# Patient Record
Sex: Male | Born: 1980
Health system: Southern US, Community
[De-identification: ages and names within clinical notes are randomized; demographics above are authoritative.]

## PROBLEM LIST (undated history)

## (undated) ENCOUNTER — Ambulatory Visit: Admission: EM | Payer: Medicaid Other

## (undated) DIAGNOSIS — S069XAA Unspecified intracranial injury with loss of consciousness status unknown, initial encounter: Secondary | ICD-10-CM

## (undated) DIAGNOSIS — G8929 Other chronic pain: Secondary | ICD-10-CM

## (undated) DIAGNOSIS — Z79891 Long term (current) use of opiate analgesic: Secondary | ICD-10-CM

## (undated) DIAGNOSIS — W3400XA Accidental discharge from unspecified firearms or gun, initial encounter: Secondary | ICD-10-CM

## (undated) DIAGNOSIS — S069X9A Unspecified intracranial injury with loss of consciousness of unspecified duration, initial encounter: Secondary | ICD-10-CM

## (undated) DIAGNOSIS — S0990XA Unspecified injury of head, initial encounter: Secondary | ICD-10-CM

## (undated) DIAGNOSIS — R0683 Snoring: Secondary | ICD-10-CM

## (undated) DIAGNOSIS — G4730 Sleep apnea, unspecified: Secondary | ICD-10-CM

## (undated) DIAGNOSIS — I10 Essential (primary) hypertension: Secondary | ICD-10-CM

## (undated) DIAGNOSIS — E785 Hyperlipidemia, unspecified: Secondary | ICD-10-CM

## (undated) DIAGNOSIS — K769 Liver disease, unspecified: Secondary | ICD-10-CM

## (undated) DIAGNOSIS — J45909 Unspecified asthma, uncomplicated: Secondary | ICD-10-CM

## (undated) DIAGNOSIS — E119 Type 2 diabetes mellitus without complications: Secondary | ICD-10-CM

## (undated) HISTORY — DX: Snoring: R06.83

## (undated) HISTORY — DX: Essential (primary) hypertension: I10

## (undated) HISTORY — DX: Sleep apnea, unspecified: G47.30

## (undated) HISTORY — DX: Hyperlipidemia, unspecified: E78.5

## (undated) HISTORY — PX: TONSILLECTOMY: SHX28A

## (undated) HISTORY — DX: Unspecified asthma, uncomplicated: J45.909

## (undated) HISTORY — DX: Liver disease, unspecified: K76.9

## (undated) HISTORY — DX: Morbid (severe) obesity due to excess calories: E66.01

## (undated) HISTORY — DX: Type 2 diabetes mellitus without complications: E11.9

## (undated) NOTE — Progress Notes (Signed)
Formatting of this note might be different from the original.  Pl make sure he is taking his amlodipine as well as losartan in addition to Aldactazide daily.  Electronically signed by Dorris Singh, MD at 09/02/2021 10:20 AM EDT

## (undated) NOTE — Progress Notes (Signed)
Formatting of this note is different from the original.  CC: Mr. Isaiah Henderson comes in for follow up of obstructive sleep apnea and asthma. He was last seen on 07/30/2016.    HPI: Mr. Isaiah Henderson was diagnosed to have   OSA and is using CPAP @ 12 cm H2O since  07/2012. He says that he is using CPAP every night all night and has noted with improvement.  Denies nocturnal awakenings, restless sleep or dyspnea. Feels fresh on waking up and has no daytime fatigue.     He continues to get episodic dyspnea with wheezing and takes  albuterol HFA inhaler 3-4 times daily with prompt relief. He is not taking any LABA/ICS medications. Marland KitchenHe is trying to lose weight  But has gained a further 12 lbs over the last one year.    ROS:   Asthma,   Hpertension,   Diabetes.   Obesity.    A complete 10 point ROS was negative other than that mentioned above .     PHYSICAL  EXAMINATION:    Vitals: BP 133/71   Pulse 79   Temp 36.6 C (97.8 F) (Oral)   Ht 1.778 m (5\' 10" )   Wt (!) 160.1 kg (352 lb 14.4 oz)   SpO2 97% on t: RA  BMI 50.64 kg/m  General: Awake ,alert and oriented and talking in complete sentences. Morbidly obese.  HEENT: Mallampati Gr 4 pharyngeal narrowing. No pallor or icterus No JVD or LAD in the neck   Neck is thick and short Circumference 19.5"  R/S: Clear without any wheezing or crackles   CVS:S1S2 RRR   ABD: Obese ,soft ,nontender with good bowel sounds   EXTR: No edema of feet       PFT:(07/02/2016):   PFT ():                      Pre   % pred    Post   % pred    % Change             FVC Liters      2.79     52        2.83       55               5             FEV1 Liters   1.85     42         2.02       47              9             FEV1/FVC %    68                               69  These indicate severe obstructive Defect . No significant FEV1 response to bronchodilator.  PFT:(02/05/2017):   PFT ():                      Pre   % pred    Post   % pred    % Change             FVC Liters      2.59     58      3.09         69  19             FEV1 Liters   1.65     45       2.08        57              27             FEV1/FVC %    63                              68  These indicate moderate  obstructive Defect . Significant FEV1 response to bronchodilator.    ASSESSMENT:   1.OSA on CPAP at 12 cmH2O  2. Asthma  3.Hypertension  4. Diabetes.   5.Morbid . Obesity.    PLAN:   Continue all current medications and follow up  Contiinue using CPAP @ 12 cmH2O every night all night  Prescribed:            Breo Ellipta 200/25 1 puff daily.            Albuterol (ventolin)) HFA 2 puff Q6H PRN  Advised to lose weight  RTC 6 months  Electronically signed by Jerl Mina, MD at 02/10/2017 11:18 AM EST

## (undated) NOTE — Progress Notes (Signed)
Formatting of this note is different from the original.  Subjective:        Patient ID: Isaiah Henderson is a 80 y.o. male.    HPI    Patient's medications, allergies, past medical, surgical, social and family histories were reviewed and updated as appropriate.    Isaiah Henderson is a 43 y.o. 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     My first encounter with Isaiah Henderson was on 01/21/2021, at which time the patient was referred for positive ANA, elevated CRP/ESR.     He saw his primary care doctor in 06/2020, reported bilateral arms and legs numbness and tingling. He has diabetes, hypertension, hyperlipidemia, asthma, OSA on CPAP, obesity.     Had screening test in 06/20/2020 ANA speckle 1:80., ESR 71. CRP 35.6.  Repeated labs 12/11/2020 CRP 24.5. ESR 21.    He has chest pain, SOB with asthma and OSA. Dry mouth but not dry eyes.     Denies skin rash, oral ulcer,  abdominal pain, diarrhea, constipation, dysuria, hematuria, headache. Denies photosensitivity, and Raynaud's.      03/04/2021, telemedicine visit. ANA speckle 1:160. CRP 24.5. ANA specificity, C3, C4 were all normal. He has fatigue, joints ache at back and hips. Recommended hydroxychloroquine 400mg  daily. Need yearly ophthalmologist exam to monitor retinal toxicity. Side effect explained.     09/02/2021, follow up visit. He felt good on hydroxychloroquine 400mg  daily. No joints pain anymore. Eye exam was normal. No side effects.     Review of Systems    Per HPI. Review of complete ROS is negative.     Past Medical History:   Diagnosis Date   ? Asthma    ? Bulging lumbar disc 02/2017   ? Diabetes mellitus     Diet and exercise controlled   ? 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 Hypertension    ? Low back pain    ? Obesity    ? Sleep apnea     Uses CPAP   ? Testicular mass     left sided   ? Thyroid disease     "Nodules"       Objective:       Physical Exam    Visit Vitals  BP (!) 167/112 (BP Location: Right arm,  Patient Position: Sitting, Cuff size: Adult Large)   Pulse 94   Temp 36.6 C (97.8 F) (Oral)   Wt (!) 177.4 kg (391 lb)   SpO2 98%   BMI 56.10 kg/m     GENE: no acute distress.   HEENT: no facial erythema, hearing grossly intact.     Extremities no cyanosis, clubbing or edema.   Neuro exam: AAO*3.   Skin exam: No psoriasis or vasculitis skin rash.   Joint exam: No active synovitis.    Data reviewed:   I personally reviewed old chart, labs, images, medications, allergies in Epic, note and other clinical date from referring office.     Had screening test in 06/20/2020 ANA speckle 1:80., ESR 71. CRP 35.6.  Repeated labs 12/11/2020 CRP 24.5. ESR 21.    Clinical Support on 08/12/2021   Component Date Value Ref Range Status   ? Bicarbonate 08/12/2021 25  22 - 29 mmol/L Final   ? Chloride 08/12/2021 102  98 - 107 mmol/L Final   ? Creatinine 08/12/2021 0.71  0.70 - 1.20 mg/dL Final   ? Glucose 16/11/9602 123  70 -  140 mg/dL Final   ? Potassium 16/11/9602 3.4  3.4 - 5.1 mmol/L Final    Comment: Threshold for indicating hemolysis  was determined through independent  validation at [facility].    ? Sodium 08/12/2021 137  136 - 145 mmol/L Final   ? Blood Urea Nitrogen 08/12/2021 17  6 - 20 mg/dL Final   ? Anion Gap 54/10/8117 10  8 - 15 mmol/L Final   ? Osmolality, Cal 08/12/2021 286  275 - 300 mosm/kg Final   ? BUN/Cre Ratio 08/12/2021 24   Final   ? Calcium 08/12/2021 9.0  8.6 - 10.0 mg/dL Final   ? GFR Male 1478 CKD-EPI 08/12/2021 >90  >59 mL/min/1.82m2 Final   ? GFR Male 2021 CKD-EPI 08/12/2021 >90  >59 mL/min/1.49m2 Final    For reference ranges and result interpretation, refer to the Laboratory Test Catalog at [email or web address]       Assessment:       1. Borderline positive ANA, elevated ESR/CRP. Not meet the diagnosis of lupus. Connective tissue disease. HCQ 400mg  daily.     2. Arms and legs numbness and tingling intermittently. ? Diabetic neuropathy.     3. Metabolic syndrome,  diabetes, obesity, HTN, hyperlipidemia.     4. Asthma and OSA on CPAP.       Plan:       - Jeanpaul was seen today for follow-up.    Diagnoses and all orders for this visit:    Connective tissue disease, undifferentiated    ANA positive    Other orders  -     Hydroxychloroquine Sulfate 200 MG Oral Tablet (PLAQUENIL); Take 2 tablets by mouth every evening    - continue hydroxychloroquine 400mg  daily. Need yearly ophthalmologist exam to monitor retinal toxicity. Side effect explained.     - Suggested AIP diet  (auti-inflammatory Paleo diet, no sugar, no gluten, no diary).     - Chart, labs, images at our institution and outside facilities including PCP, other providers's office note, past medical history and problems documented by other physicians were reviewed by myself.     - Activities as tolerated.     - RV in 12 months  .   - Above findings, analysis and plan were all discussed with the patient and questions were answered as much as possible.    - My medical decision making was the substantive portion of the service.       The above note was formulated using dragon dictation software system. A reasonable attempt has been made to correct any errors however undetected typographical errors may be present.    Most of the documentation is carried over from the previous note to maintain the accuracy of documentation.           Electronically signed by Haze Justin, MD at 09/02/2021  3:38 PM EDT

## (undated) NOTE — Nursing Note (Signed)
Formatting of this note might be different from the original.  Verified with pt - he is taking the following medications     Amlodipine 10 mg qday  Losartan 50 mg qday  Indapamide 1.25 mg qday  Aldactone 25 mg qday   Electronically signed by Lyndee Hensen, RN at 09/11/2021  2:58 PM EDT

## (undated) NOTE — Progress Notes (Signed)
Formatting of this note might be different from the original.  I examined the patient and discussed with fellow. I agree with assessment and plan or modified as written.  SM.    Electronically signed by Dorris Singh, MD at 12/05/2021  5:18 PM EDT

## (undated) NOTE — Progress Notes (Signed)
Formatting of this note might be different from the original.  Is this patient not taking his Amlodipine 10 mg and Losartan 50 mg pills too ?  Electronically signed by Dorris Singh, MD at 08/27/2021  2:17 PM EDT

## (undated) NOTE — Nursing Note (Signed)
Formatting of this note might be different from the original.  Pt seen 12/03/21 -pt non compliant with medications   Electronically signed by Lyndee Hensen, RN at 12/17/2021  3:55 PM EDT

## (undated) NOTE — Nursing Note (Signed)
Formatting of this note might be different from the original.  Patient is here for a BP check. He states that he is taking indapamide 2.5 mg daily and spironolactone 25 mg daily.     BP 135/78  BP 132/78    HR 58     Please advise   Electronically signed by Cherylynn Ridges, RN at 08/27/2021  9:19 AM EDT

## (undated) NOTE — Progress Notes (Signed)
Formatting of this note is different from the original.  Images from the original note were not included.    Cardiology Office Note     Isaiah Henderson is a 19 y.o. male with a PMH of nonobstructive CAD, HTN, DM 2, OSA on CPAP, grade 3 obesity, asthma, who is being seen in the cardiology clinic today for  follow up.      Patient's blood pressure has been difficult to manage.  There have been concerns about his compliance.  As per dispense report, he has not filled most of his medications in many months.  Patient reports that he lost his health insurance for a while and was not able to take his medications.  He was also fully compliant with all of his medications but he is trying to be better now that he is going for bariatric surgery.  He also reports he had some leftover medications and has been taking his blood pressure medications for the past 1 month.  He is unsure about the names of the medications but  he was under the impression that his amlodipine was discontinued when he was started on indapamide.  He also reports urinating 40 times a day due to medications.  Patient was advised to bring all of his medications at the next visit.    Denies any active cardiac complaints including chest pain or chest pressure or dizziness or lightheadedness.  Reports chronic dyspnea which has not changed since the last visit.  He has seen [place] [facility] for bariatric surgery.    Review of Systems     Review of Systems   Constitutional: Negative for chills and fever.   Respiratory: Negative for cough, shortness of breath and wheezing.    Cardiovascular: Negative for chest pain and palpitations.   Gastrointestinal: Negative for abdominal pain, diarrhea and vomiting.   Endocrine: Positive for polyuria.   Genitourinary: Negative for difficulty urinating.   Neurological: Negative for headaches.   Psychiatric/Behavioral: Negative for confusion.     Past History     Past Medical History:   Diagnosis Date   ? Asthma    ? Bulging  lumbar disc 02/2017   ? Diabetes mellitus     Diet and exercise controlled   ? 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 Hypertension    ? Low back pain    ? Obesity    ? Sleep apnea     Uses CPAP   ? Testicular mass     left sided   ? Thyroid disease     "Nodules"     Past Surgical History:   Procedure Laterality Date   ? PR CYSTOURETHROSCOPY,BIOPSY N/A 01/18/2018    Procedure: cystoscopy with biopsy;  Surgeon: Roxy Horseman, MD;  Location: [facility];  Service: Urology;  Laterality: N/A;   ? Thyroid Biopsy  07/21/2016   ? TONSILLECTOMY       Family History   Problem Relation Age of Onset   ? Diabetes Father    ? Hypertension Father    ? Leukemia Father    ? Cancer Father    ? Thyroid disease Mother         Goiter   ? Asthma Mother    ? Arthritis Mother    ? Heart disease Sister    ? Alcohol abuse Neg Hx    ? Drug abuse Neg Hx    ? Mental illness Neg Hx  Social History     Socioeconomic History   ? Marital status: Married     Spouse name: Not on file   ? Number of children: Not on file   ? Years of education: Not on file   ? Highest education level: Not on file   Occupational History   ? Not on file   Tobacco Use   ? Smoking status: Former     Packs/day: 0.30     Years: 3.00     Additional pack years: 0.00     Total pack years: 0.90     Types: Cigarettes     Quit date: 07/27/2003     Years since quitting: 18.3   ? Smokeless tobacco: Never   Vaping Use   ? Vaping Use: Never used   Substance and Sexual Activity   ? Alcohol use: Not Currently     Comment: rare   ? Drug use: No   ? Sexual activity: Yes     Partners: Female     Birth control/protection: None   Other Topics Concern   ? Not on file   Social History Narrative   ? Not on file     Social Determinants of Health     Financial Resource Strain: Not on file   Food Insecurity: Not on file   Transportation Needs: Not on file   Physical Activity: Not on file   Stress: Not on file   Social Connections: Not on file   Intimate Partner Violence: Not on  file   Housing Stability: Not on file     Medications and Allergies   ALLERGIES/SENSITIVITIES: Dust mite extract  (Not in a hospital admission)    Physical Exam   BMI: Body mass index is 57.25 kg/m.   Blood Pressure: BP: (!) 154/109 Pulse: Pulse: 87   Temperature:   Respirations:     Admission Weight: Weight: (!) 181 kg (399 lb) O2 Saturation:     Physical Exam:   Physical Exam  Constitutional:       General: He is not in acute distress.     Appearance: He is obese. He is not ill-appearing.   HENT:      Head: Normocephalic and atraumatic.   Eyes:      General: No scleral icterus.     Pupils: Pupils are equal, round, and reactive to light.   Cardiovascular:      Rate and Rhythm: Normal rate and regular rhythm.      Pulses: Normal pulses.      Heart sounds: Normal heart sounds. No murmur heard.     No friction rub.   Pulmonary:      Effort: Pulmonary effort is normal. No respiratory distress.      Breath sounds: Normal breath sounds. No stridor. No wheezing.   Abdominal:      General: Abdomen is flat. There is no distension.      Palpations: Abdomen is soft. There is no mass.      Tenderness: There is no abdominal tenderness.   Musculoskeletal:      Right lower leg: Edema present.      Left lower leg: Edema present.   Neurological:      Mental Status: He is alert and oriented to person, place, and time.   Psychiatric:         Mood and Affect: Mood normal.     Diagnostics   Lab Results    Last lipid panel  Lab Results  Component Value Date    CHO 155 12/11/2020    TRIG 66 12/11/2020    LDL 98 12/11/2020    VLDL 13 (L) 12/11/2020     Last hemoglobin A1c  Lab Results   Component Value Date    HGBA1C 6.8 (H) 12/11/2020     Cardiographics    Most recent EKG: 12/03/21 - Sinus rhythm with Normal AV and Normal IV conduction times. Axis in the frontal plane is 40 degrees.  Nonspecific ST and T wave abnormalities-- cannot rule out lateral ischemia.- Abnormal EKG  Prior Echo: Echo 01/15/2020: LVEF 58%, left ventricular  concentric remodeling, trivial MR, trivial TR and trace/physiologic pulmonary regurgitation  Prior Cardiac Catheterization:   Cath 2018 by [place] Hun not available, non obstructive, mild plaques in coronaries as per past notes     Assessment & Plan     Isaiah Henderson is a 29 y.o. male with a PMH of nonobstructive CAD, HTN, DM 2, OSA on CPAP, grade 3 obesity, asthma, who is being seen in the cardiology clinic today for  follow up.     #HTN  -Refilled amlodipine 10 mg daily, Coreg 25 mg twice daily.  Increase Losartan to 50 mg twice daily.  Reduce indapamide to 1.25 mg daily due to patient complains of frequency of urination.  Continue with Jardiance.  -Paper prescription for blood pressure cuff was given.  Patient was advised to maintain a blood pressure log.  Nurse visit for blood pressure check in 2 weeks.    #Nonobstructive CAD  #HLD  - As per past notes, patient had cath in 2018 with nonobstructive CAD, mild plaques in the coronaries  - Continue with aspirin, Lipitor    The patient was seen and discussed with Dr. Rolly Salter    Return visit:  6 months    Hilton Sinclair, MBBS  Cardiology Fellow    Date: December 03, 2021  Time: 6:01 PM    Electronically signed by Dorris Singh, MD at 12/05/2021  5:17 PM EDT

## (undated) NOTE — Progress Notes (Signed)
Formatting of this note might be different from the original.  Called and spoke with pt and he said he is taking amlodipine 10 mg and losartan 50 mg daily   Electronically signed by Lyndee Hensen, RN at 08/29/2021  4:58 PM EDT

---

## 1999-08-31 ENCOUNTER — Inpatient Hospital Stay (HOSPITAL_COMMUNITY): Admission: EM | Admit: 1999-08-31 | Discharge: 1999-09-02 | Payer: Self-pay

## 2005-01-16 ENCOUNTER — Emergency Department (HOSPITAL_COMMUNITY): Admission: EM | Admit: 2005-01-16 | Discharge: 2005-01-16 | Payer: Self-pay | Admitting: Emergency Medicine

## 2005-02-26 ENCOUNTER — Inpatient Hospital Stay (HOSPITAL_COMMUNITY): Admission: EM | Admit: 2005-02-26 | Discharge: 2005-03-03 | Payer: Self-pay | Admitting: Emergency Medicine

## 2005-03-03 ENCOUNTER — Inpatient Hospital Stay (HOSPITAL_COMMUNITY): Admission: RE | Admit: 2005-03-03 | Discharge: 2005-03-06 | Payer: Self-pay | Admitting: Psychiatry

## 2005-03-04 ENCOUNTER — Ambulatory Visit: Payer: Self-pay | Admitting: Psychiatry

## 2008-06-16 ENCOUNTER — Emergency Department (HOSPITAL_COMMUNITY): Admission: EM | Admit: 2008-06-16 | Discharge: 2008-06-16 | Payer: Self-pay | Admitting: Emergency Medicine

## 2009-12-29 ENCOUNTER — Emergency Department (HOSPITAL_COMMUNITY)
Admission: EM | Admit: 2009-12-29 | Discharge: 2009-12-29 | Payer: Self-pay | Source: Home / Self Care | Admitting: Emergency Medicine

## 2009-12-30 ENCOUNTER — Emergency Department (HOSPITAL_BASED_OUTPATIENT_CLINIC_OR_DEPARTMENT_OTHER)
Admission: EM | Admit: 2009-12-30 | Discharge: 2009-12-30 | Payer: Self-pay | Source: Home / Self Care | Admitting: Emergency Medicine

## 2010-01-31 ENCOUNTER — Emergency Department (HOSPITAL_BASED_OUTPATIENT_CLINIC_OR_DEPARTMENT_OTHER)
Admission: EM | Admit: 2010-01-31 | Discharge: 2010-01-31 | Payer: Self-pay | Source: Home / Self Care | Admitting: Emergency Medicine

## 2010-05-06 ENCOUNTER — Emergency Department (HOSPITAL_BASED_OUTPATIENT_CLINIC_OR_DEPARTMENT_OTHER)
Admission: EM | Admit: 2010-05-06 | Discharge: 2010-05-06 | Disposition: A | Discharge status: Home / Self Care | Payer: Self-pay | Attending: Emergency Medicine | Admitting: Emergency Medicine

## 2010-05-06 ENCOUNTER — Emergency Department (INDEPENDENT_AMBULATORY_CARE_PROVIDER_SITE_OTHER): Payer: Self-pay

## 2010-05-06 DIAGNOSIS — J40 Bronchitis, not specified as acute or chronic: Secondary | ICD-10-CM | POA: Insufficient documentation

## 2010-05-06 DIAGNOSIS — R51 Headache: Secondary | ICD-10-CM | POA: Insufficient documentation

## 2010-05-06 DIAGNOSIS — R05 Cough: Secondary | ICD-10-CM

## 2010-05-06 DIAGNOSIS — F17200 Nicotine dependence, unspecified, uncomplicated: Secondary | ICD-10-CM | POA: Insufficient documentation

## 2010-05-06 DIAGNOSIS — R509 Fever, unspecified: Secondary | ICD-10-CM

## 2010-05-06 DIAGNOSIS — R0989 Other specified symptoms and signs involving the circulatory and respiratory systems: Secondary | ICD-10-CM

## 2010-05-06 DIAGNOSIS — K006 Disturbances in tooth eruption: Secondary | ICD-10-CM | POA: Insufficient documentation

## 2010-05-27 LAB — POCT I-STAT, CHEM 8
BUN: 15 mg/dL (ref 6–23)
Calcium, Ion: 1.02 mmol/L — ABNORMAL LOW (ref 1.12–1.32)
Chloride: 110 mEq/L (ref 96–112)
Creatinine, Ser: 1 mg/dL (ref 0.4–1.5)
Glucose, Bld: 108 mg/dL — ABNORMAL HIGH (ref 70–99)
TCO2: 19 mmol/L (ref 0–100)

## 2010-05-27 LAB — ETHANOL: Alcohol, Ethyl (B): 229 mg/dL — ABNORMAL HIGH (ref 0–10)

## 2010-07-04 NOTE — Discharge Summary (Signed)
Bruce [name], THEURER                 ACCOUNT NO.:  [id number]   MEDICAL RECORD NO.:  [id number]          PATIENT TYPE:  INP   LOCATION:  2925                         FACILITY:  [facility]   PHYSICIAN:  Jonna L. Robb Matar, M.D.DATE OF BIRTH:  1981-02-13   DATE OF ADMISSION:  02/26/2005  DATE OF DISCHARGE:                                 DISCHARGE SUMMARY   DISCHARGE DATE:  Not yet determined.   PRIMARY CARE PHYSICIAN:  Unassigned.   DISCHARGE DIAGNOSES:  1.  Tylenol overdose.  2.  Drug-induced hepatitis.  3.  Hypokalemia.  4.  Depression.   HISTORY:  This 30 year old Caucasian male ingested 24 tablets of Tylenol PM  and was found by his father and brought to the emergency room.  He was  lethargic but arousable.  Physical exam was unremarkable.  Initial  laboratory work showed elevated liver function tests.   HOSPITAL COURSE:  The patient was started on Mucomyst treatment, monitored,  put on IV fluids.  His second liver function test worsened slightly, but  after that they started to improve.  He had some mild hypokalemia which was  replaced.  The patient had no further problems.  He was evaluated for  psychiatric issues by the [facility] and felt to be a suitable candidate for  treatment at the [facility].  The patient has been medically  cleared for transfer.  Sponsorship is being sought for voluntary admission  to [facility].      Jonna L. Robb Matar, M.D.  Electronically Signed     JLB/MEDQ  D:  03/03/2005  T:  03/03/2005  Job:  [id number]

## 2010-07-04 NOTE — H&P (Signed)
NAME:  Bruce [name], Bruce [name] NO.:  [id number]   MEDICAL RECORD NO.:  [id number]          PATIENT TYPE:  IPS   LOCATION:  0506                          FACILITY:  [facility]   PHYSICIAN:  Anselm Jungling, MD  DATE OF BIRTH:  Aug 19, 1980   DATE OF ADMISSION:  03/03/2005  DATE OF DISCHARGE:  03/06/2005                         PSYCHIATRIC ADMISSION ASSESSMENT   A 30 year old single white male voluntarily admitted on March 03, 2005.   HISTORY OF PRESENT ILLNESS:  The patient overdosed on Tylenol 25 mg tablets.  Patient said he started with a few then took a whole bottle of Tylenol which  was 24 tablets.  The patient was hoping to go to sleep and did not care how  long he slept.  Patient did not realize the seriousness of the medications.  He states that he was not trying to kill himself.  He states that it was  somewhat impulsive after conflict with his girlfriend who wanted to leave  him and is pregnant with his child.  The patient is currently also  unemployed.  His girlfriend is [redacted] weeks pregnant, and patient has some  recent legal issues.   PAST PSYCHIATRIC HISTORY:  First hospitalization in [facility].   SOCIAL HISTORY:  This is a 30 year old single white male.  Patient was  married to his present girlfriend.  He was 89 years of age and then divorced  her, and again is back in a relationship with her.  She is currently [redacted]  weeks pregnant.  Patient is unemployed.  He is looking for employment.  The  last time he worked was a year ago.  He obtained his GED.  Patient states he  is currently off probation.  He had legal issues with intention to sell and  distribute marijuana.  He then violated his parole with a DUI.   FAMILY HISTORY:  Denies.   ALCOHOL AND DRUG HISTORY:  Patient states he drinks on occasion.  He is  using marijuana.   PRIMARY CARE [name]:  Unknown.   MEDICAL PROBLEMS:  Patient states he is healthy.   MEDICATIONS:  None.   DRUG  ALLERGIES:  PERCOCET.   PHYSICAL EXAMINATION:  Patient was assessed at [facility] Emergency  Department where he was hospitalized after intentional overdose, as his  Tylenol and liver enzymes were elevated, also having some abdominal pain.  Patient presents today very healthy appearing, no acute distress.  Temperature 98.3, heart rate 59, respirations 16, blood pressure 122/71.  His hepatitis B was positive.  AST is 210.  ALT 466.  Acetaminophen level  was 472 on admission, potassium 3.3.   MENTAL STATUS EXAM:  He is an alert, cooperative male, polite, casually  dressed.  Speech is clear, normal pace and tone.  Patient feels sad.  His  affect is flat.  Thought processes are coherent, no evidence of psychosis.  Cognitive function intact.  Memory is fair.  Judgment is fair.  Insight is  limited.   ADMISSION DIAGNOSES:  AXIS I:  Depressive disorder, not otherwise specified.  Tetrahydrocannabinol abuse.  AXIS II:  Deferred.  AXIS III:  None.  AXIS IV:  Problems with primary support group, his girlfriend, occupation,  economic issues, problems related to legal system.  AXIS V:  Current is 35.   PLAN:  Contract for safety.  Stabilize mood and thinking.  Antidepressants  were discussed; patient feels he is not depressed and does not feel he needs  any medication.  We will attempt a family session with his girlfriend.  Patient is to increase his coping skills and follow up with possibly some  therapy.  Tentative length of stay 3-4 days.      Landry Corporal, N.P.      Anselm Jungling, MD  Electronically Signed    JO/MEDQ  D:  03/06/2005  T:  03/06/2005  Job:  [id number]
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
## 2010-07-04 NOTE — Discharge Summary (Signed)
NAME:  [name], [name] NO.:  [id number]   MEDICAL RECORD NO.:  [id number]          PATIENT TYPE:  IPS   LOCATION:  0506                          [facility]   PHYSICIAN:  Anselm Jungling, MD  DATE OF BIRTH:  08-05-1980   DATE OF ADMISSION:  03/03/2005  DATE OF DISCHARGE:                                 DISCHARGE SUMMARY   IDENTIFYING DATA/REASON FOR ADMISSION:  This is the first [facility] admission for  Bruce [name], a 30 year old male admitted in the aftermath of an overdose of  Tylenol. The patient has a variety of psychosocial stressors including  difficulty with his primary relationship, that is, his ex-wife, with whom he  is now back together, and who is now pregnant with their first child.   The patient indicated upon admission that he immediately regretted his  overdose as stupid.  He admitted that he had been depressed since 07-12-1999  when his mother died due to cirrhosis. The patient is also experiencing  financial stressors due to unemployment. Please refer to the admission note  for further details pertaining to the symptoms, circumstances and history  that led to his hospitalization.   INITIAL DIAGNOSTIC IMPRESSION:  He was given an initial AXIS I diagnosis of  major depressive disorder, recurrent without psychotic features.   MEDICAL/LABORATORY:  The patient was medically cleared prior to transfer to  the inpatient psychiatric service, where he was screened by the psychiatric  nurse practitioner. There were no significant medical issues during this  brief inpatient psychiatric stay.   HOSPITAL COURSE:  The patient was admitted to the adult inpatient  psychiatric service where he participated in various therapeutic groups,  activities and classes designed help him acquire better coping skills and  develop a better understanding of his underlying depression and dynamics. He  presented as a clean-cut, well-nourished, healthy-appearing young man of at  least  average intelligence. His thoughts and speech were normally organized.  There was nothing to suggest any underlying thought disorder or psychosis.  His mood was suppressed with appropriate affect. He was open to getting  help, but indicated that he was not interested in the trial of  antidepressant medication. He was cooperative throughout, denied suicidal  ideation throughout, and was a reasonably good program participant.   On the fourth hospital day, the patient was felt to be ready for discharge,  which was concurrent with his wishes.   AFTERCARE:  The patient was to follow-up with [facility] in  [place], date and time to be arranged at the time of this dictation.   DISCHARGE MEDICATIONS:  None.   DISCHARGE DIAGNOSES:  AXIS I:  Major depressive disorder, recurrent, without  psychotic features.  AXIS II:  Deferred.  AXIS III:  No acute or chronic illnesses.  AXIS IV:  Stressors:  Severe.  AXIS V:  GAF on discharge 65.           ______________________________  Anselm Jungling, MD  Electronically Signed     SPB/MEDQ  D:  03/06/2005  T:  03/06/2005  Job:  [id number]

## 2013-08-15 ENCOUNTER — Emergency Department (HOSPITAL_COMMUNITY)
Admission: EM | Admit: 2013-08-15 | Discharge: 2013-08-15 | Disposition: A | Discharge status: Home / Self Care | Payer: Self-pay | Attending: Emergency Medicine | Admitting: Emergency Medicine

## 2013-08-15 ENCOUNTER — Encounter (HOSPITAL_COMMUNITY): Payer: Self-pay | Admitting: Emergency Medicine

## 2013-08-15 DIAGNOSIS — R21 Rash and other nonspecific skin eruption: Secondary | ICD-10-CM | POA: Insufficient documentation

## 2013-08-15 DIAGNOSIS — F17200 Nicotine dependence, unspecified, uncomplicated: Secondary | ICD-10-CM | POA: Insufficient documentation

## 2013-08-15 DIAGNOSIS — J019 Acute sinusitis, unspecified: Secondary | ICD-10-CM | POA: Insufficient documentation

## 2013-08-15 DIAGNOSIS — Z79899 Other long term (current) drug therapy: Secondary | ICD-10-CM | POA: Insufficient documentation

## 2013-08-15 DIAGNOSIS — J018 Other acute sinusitis: Secondary | ICD-10-CM

## 2013-08-15 LAB — RAPID STREP SCREEN (MED CTR MEBANE ONLY): STREPTOCOCCUS, GROUP A SCREEN (DIRECT): NEGATIVE

## 2013-08-15 MED ORDER — BENZONATATE 100 MG PO CAPS: 100.0000 mg | ORAL_CAPSULE | Freq: Three times a day (TID) | ORAL | Status: DC

## 2013-08-15 MED ORDER — ALBUTEROL SULFATE HFA 108 (90 BASE) MCG/ACT IN AERS
2.0000 | INHALATION_SPRAY | Freq: Once | RESPIRATORY_TRACT | Status: AC
  Administered 2013-08-15: 2 via RESPIRATORY_TRACT
  Filled 2013-08-15: qty 6.7

## 2013-08-15 MED ORDER — AMOXICILLIN 500 MG PO CAPS: 500.0000 mg | ORAL_CAPSULE | Freq: Three times a day (TID) | ORAL | Status: DC

## 2013-08-15 MED ORDER — MUPIROCIN CALCIUM 2 % EX CREA: 1.0000 "application " | TOPICAL_CREAM | Freq: Three times a day (TID) | CUTANEOUS | Status: DC

## 2013-08-15 NOTE — ED Notes (Signed)
Pt presents to department for evaluation of cough, runny nose and congestion. Ongoing for several days. Respirations unlabored. No signs of distress noted.
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
## 2013-08-15 NOTE — Discharge Instructions (Signed)
Use nasal saline (you can try Arm and Hammer Simply Saline) at least 4 times a day Do not use Afrin (Oxymetazoline) Rest, wash hands frequently  and drink plenty of water.   Take your antibiotics as directed and to completion. You should never have any leftover antibiotics! Push fluids and stay well hydrated.    Do not hesitate to return to the Emergency Department for any new, worsening or concerning symptoms.   If you do not have a primary care doctor you can establish one at the   [facility]: [address] [place] [address] [phone]  After you establish care. Let them know you were seen in the emergency room. They must obtain records for further management.

## 2013-08-17 LAB — CULTURE, GROUP A STREP

## 2013-08-21 NOTE — ED Provider Notes (Signed)
Medical screening examination/treatment/procedure(s) were performed by non-physician practitioner and as supervising physician I was immediately available for consultation/collaboration.   EKG Interpretation None        Rolland PorterMark James, MD 08/21/13 2350

## 2013-11-17 ENCOUNTER — Emergency Department (HOSPITAL_COMMUNITY): Payer: Medicaid Other

## 2013-11-17 ENCOUNTER — Encounter (HOSPITAL_COMMUNITY): Admission: EM | Disposition: A | Discharge status: Rehabilitation Facility | Payer: Self-pay | Source: Home / Self Care

## 2013-11-17 ENCOUNTER — Inpatient Hospital Stay (HOSPITAL_COMMUNITY)
Admission: EM | Admit: 2013-11-17 | Discharge: 2013-11-23 | DRG: 025 | Disposition: A | Discharge status: Rehabilitation Facility | Payer: Medicaid Other | Attending: General Surgery | Admitting: General Surgery

## 2013-11-17 ENCOUNTER — Encounter (HOSPITAL_COMMUNITY): Payer: Medicaid Other | Admitting: Anesthesiology

## 2013-11-17 ENCOUNTER — Inpatient Hospital Stay (HOSPITAL_COMMUNITY): Payer: Medicaid Other | Admitting: Anesthesiology

## 2013-11-17 ENCOUNTER — Encounter (HOSPITAL_COMMUNITY): Payer: Self-pay | Admitting: Emergency Medicine

## 2013-11-17 DIAGNOSIS — Z590 Homelessness: Secondary | ICD-10-CM

## 2013-11-17 DIAGNOSIS — S0101XA Laceration without foreign body of scalp, initial encounter: Secondary | ICD-10-CM | POA: Diagnosis present

## 2013-11-17 DIAGNOSIS — D62 Acute posthemorrhagic anemia: Secondary | ICD-10-CM | POA: Diagnosis not present

## 2013-11-17 DIAGNOSIS — R471 Dysarthria and anarthria: Secondary | ICD-10-CM | POA: Diagnosis present

## 2013-11-17 DIAGNOSIS — R4587 Impulsiveness: Secondary | ICD-10-CM | POA: Diagnosis present

## 2013-11-17 DIAGNOSIS — R4701 Aphasia: Secondary | ICD-10-CM | POA: Diagnosis present

## 2013-11-17 DIAGNOSIS — Y249XXA Unspecified firearm discharge, undetermined intent, initial encounter: Secondary | ICD-10-CM

## 2013-11-17 DIAGNOSIS — F1010 Alcohol abuse, uncomplicated: Secondary | ICD-10-CM | POA: Diagnosis present

## 2013-11-17 DIAGNOSIS — R451 Restlessness and agitation: Secondary | ICD-10-CM | POA: Diagnosis not present

## 2013-11-17 DIAGNOSIS — S020XXB Fracture of vault of skull, initial encounter for open fracture: Principal | ICD-10-CM | POA: Diagnosis present

## 2013-11-17 DIAGNOSIS — S81039A Puncture wound without foreign body, unspecified knee, initial encounter: Secondary | ICD-10-CM

## 2013-11-17 DIAGNOSIS — J9600 Acute respiratory failure, unspecified whether with hypoxia or hypercapnia: Secondary | ICD-10-CM | POA: Diagnosis present

## 2013-11-17 DIAGNOSIS — Y906 Blood alcohol level of 120-199 mg/100 ml: Secondary | ICD-10-CM | POA: Diagnosis present

## 2013-11-17 DIAGNOSIS — Y92410 Unspecified street and highway as the place of occurrence of the external cause: Secondary | ICD-10-CM | POA: Diagnosis not present

## 2013-11-17 DIAGNOSIS — S065X9A Traumatic subdural hemorrhage with loss of consciousness of unspecified duration, initial encounter: Secondary | ICD-10-CM | POA: Diagnosis present

## 2013-11-17 DIAGNOSIS — G8191 Hemiplegia, unspecified affecting right dominant side: Secondary | ICD-10-CM | POA: Diagnosis present

## 2013-11-17 DIAGNOSIS — S069X9A Unspecified intracranial injury with loss of consciousness of unspecified duration, initial encounter: Secondary | ICD-10-CM | POA: Diagnosis present

## 2013-11-17 DIAGNOSIS — S066X9A Traumatic subarachnoid hemorrhage with loss of consciousness of unspecified duration, initial encounter: Secondary | ICD-10-CM | POA: Diagnosis present

## 2013-11-17 DIAGNOSIS — T839XXA Unspecified complication of genitourinary prosthetic device, implant and graft, initial encounter: Secondary | ICD-10-CM | POA: Diagnosis not present

## 2013-11-17 DIAGNOSIS — S0990XA Unspecified injury of head, initial encounter: Secondary | ICD-10-CM | POA: Diagnosis present

## 2013-11-17 DIAGNOSIS — R339 Retention of urine, unspecified: Secondary | ICD-10-CM | POA: Diagnosis not present

## 2013-11-17 DIAGNOSIS — R319 Hematuria, unspecified: Secondary | ICD-10-CM | POA: Diagnosis not present

## 2013-11-17 DIAGNOSIS — S0193XA Puncture wound without foreign body of unspecified part of head, initial encounter: Secondary | ICD-10-CM

## 2013-11-17 DIAGNOSIS — S81011A Laceration without foreign body, right knee, initial encounter: Secondary | ICD-10-CM | POA: Diagnosis present

## 2013-11-17 DIAGNOSIS — W3400XA Accidental discharge from unspecified firearms or gun, initial encounter: Secondary | ICD-10-CM

## 2013-11-17 DIAGNOSIS — R40243 Glasgow coma scale score 3-8: Secondary | ICD-10-CM | POA: Diagnosis present

## 2013-11-17 DIAGNOSIS — R338 Other retention of urine: Secondary | ICD-10-CM | POA: Diagnosis present

## 2013-11-17 DIAGNOSIS — S069XAA Unspecified intracranial injury with loss of consciousness status unknown, initial encounter: Secondary | ICD-10-CM | POA: Diagnosis present

## 2013-11-17 HISTORY — PX: CRANIECTOMY FOR DEPRESSED SKULL FRACTURE: SHX5788

## 2013-11-17 LAB — COMPREHENSIVE METABOLIC PANEL
ALT: 24 U/L (ref 0–53)
ALT: 14 U/L (ref 0–53)
AST: 28 U/L (ref 0–37)
AST: 22 U/L (ref 0–37)
Albumin: 3.6 g/dL (ref 3.5–5.2)
Albumin: 2.9 g/dL — ABNORMAL LOW (ref 3.5–5.2)
Alkaline Phosphatase: 71 U/L (ref 39–117)
Alkaline Phosphatase: 44 U/L (ref 39–117)
Anion gap: 18 — ABNORMAL HIGH (ref 5–15)
Anion gap: 11 (ref 5–15)
BUN: 13 mg/dL (ref 6–23)
BUN: 7 mg/dL (ref 6–23)
CALCIUM: 7.8 mg/dL — AB (ref 8.4–10.5)
CALCIUM: 6.5 mg/dL — AB (ref 8.4–10.5)
CO2: 17 mEq/L — ABNORMAL LOW (ref 19–32)
CO2: 22 meq/L (ref 19–32)
CREATININE: 0.65 mg/dL (ref 0.50–1.35)
Chloride: 105 mEq/L (ref 96–112)
Chloride: 108 meq/L (ref 96–112)
Creatinine, Ser: 1 mg/dL (ref 0.50–1.35)
GFR calc Af Amer: >90 mL/min (ref >90)
GFR calc non Af Amer: >90 mL/min (ref >90)
GLUCOSE: 145 mg/dL — AB (ref 70–99)
GLUCOSE: 130 mg/dL — AB (ref 70–99)
Potassium: 3.5 mEq/L — ABNORMAL LOW (ref 3.7–5.3)
Potassium: 3.7 meq/L (ref 3.7–5.3)
Sodium: 140 mEq/L (ref 137–147)
Sodium: 141 meq/L (ref 137–147)
TOTAL PROTEIN: 6.3 g/dL (ref 6.0–8.3)
Total Bilirubin: <0.2 mg/dL — ABNORMAL LOW (ref 0.3–1.2)
Total Bilirubin: 0.5 mg/dL (ref 0.3–1.2)
Total Protein: 4.5 g/dL — ABNORMAL LOW (ref 6.0–8.3)

## 2013-11-17 LAB — PREPARE FRESH FROZEN PLASMA
Unit division: 0
Unit division: 0

## 2013-11-17 LAB — PROTIME-INR
INR: 1.12 (ref 0.00–1.49)
INR: 1.45 (ref 0.00–1.49)
INR: 1.52 — AB (ref 0.00–1.49)
PROTHROMBIN TIME: 14.4 s (ref 11.6–15.2)
PROTHROMBIN TIME: 18.3 s — AB (ref 11.6–15.2)
Prothrombin Time: 17.6 seconds — ABNORMAL HIGH (ref 11.6–15.2)

## 2013-11-17 LAB — CBC
HCT: 38.6 % — ABNORMAL LOW (ref 39.0–52.0)
HCT: 21.1 % — ABNORMAL LOW (ref 39.0–52.0)
HCT: 21.3 % — ABNORMAL LOW (ref 39.0–52.0)
HEMOGLOBIN: 12.8 g/dL — AB (ref 13.0–17.0)
HEMOGLOBIN: 7.2 g/dL — AB (ref 13.0–17.0)
Hemoglobin: 7.1 g/dL — ABNORMAL LOW (ref 13.0–17.0)
MCH: 30.4 pg (ref 26.0–34.0)
MCH: 30.2 pg (ref 26.0–34.0)
MCH: 30.4 pg (ref 26.0–34.0)
MCHC: 33.2 g/dL (ref 30.0–36.0)
MCHC: 33.6 g/dL (ref 30.0–36.0)
MCHC: 33.8 g/dL (ref 30.0–36.0)
MCV: 91.7 fL (ref 78.0–100.0)
MCV: 89.8 fL (ref 78.0–100.0)
MCV: 89.9 fL (ref 78.0–100.0)
PLATELETS: 150 10*3/uL (ref 150–400)
Platelets: 199 10*3/uL (ref 150–400)
Platelets: 129 10*3/uL — ABNORMAL LOW (ref 150–400)
RBC: 4.21 MIL/uL — AB (ref 4.22–5.81)
RBC: 2.35 MIL/uL — ABNORMAL LOW (ref 4.22–5.81)
RBC: 2.37 MIL/uL — AB (ref 4.22–5.81)
RDW: 12.8 % (ref 11.5–15.5)
RDW: 12.9 % (ref 11.5–15.5)
RDW: 12.8 % (ref 11.5–15.5)
WBC: 8 10*3/uL (ref 4.0–10.5)
WBC: 11.9 10*3/uL — AB (ref 4.0–10.5)
WBC: 11.9 10*3/uL — ABNORMAL HIGH (ref 4.0–10.5)

## 2013-11-17 LAB — URINALYSIS, ROUTINE W REFLEX MICROSCOPIC
Bilirubin Urine: NEGATIVE
Glucose, UA: NEGATIVE mg/dL
Hgb urine dipstick: NEGATIVE
KETONES UR: 15 mg/dL — AB
LEUKOCYTES UA: NEGATIVE
NITRITE: NEGATIVE
PH: 5.5 (ref 5.0–8.0)
Protein, ur: NEGATIVE mg/dL
SPECIFIC GRAVITY, URINE: 1.011 (ref 1.005–1.030)
Urobilinogen, UA: 0.2 mg/dL (ref 0.0–1.0)

## 2013-11-17 LAB — I-STAT ARTERIAL BLOOD GAS, ED
ACID-BASE DEFICIT: 2 mmol/L (ref 0.0–2.0)
Bicarbonate: 21.9 mEq/L (ref 20.0–24.0)
O2 Saturation: 100 %
PCO2 ART: 34 mmHg — AB (ref 35.0–45.0)
PO2 ART: 552 mmHg — AB (ref 80.0–100.0)
Patient temperature: 98.4
TCO2: 23 mmol/L (ref 0–100)
pH, Arterial: 7.416 (ref 7.350–7.450)

## 2013-11-17 LAB — I-STAT CHEM 8, ED
BUN: 15 mg/dL (ref 6–23)
CALCIUM ION: 0.99 mmol/L — AB (ref 1.12–1.23)
CHLORIDE: 109 meq/L (ref 96–112)
Creatinine, Ser: 1.2 mg/dL (ref 0.50–1.35)
Glucose, Bld: 149 mg/dL — ABNORMAL HIGH (ref 70–99)
HCT: 43 % (ref 39.0–52.0)
Hemoglobin: 14.6 g/dL (ref 13.0–17.0)
Potassium: 3.3 mEq/L — ABNORMAL LOW (ref 3.7–5.3)
Sodium: 142 mEq/L (ref 137–147)
TCO2: 19 mmol/L (ref 0–100)

## 2013-11-17 LAB — TRIGLYCERIDES: TRIGLYCERIDES: 130 mg/dL (ref <150)

## 2013-11-17 LAB — CDS SEROLOGY

## 2013-11-17 LAB — PREPARE RBC (CROSSMATCH)

## 2013-11-17 LAB — MRSA PCR SCREENING: MRSA BY PCR: POSITIVE — AB

## 2013-11-17 LAB — ETHANOL: ALCOHOL ETHYL (B): 169 mg/dL — AB (ref 0–11)

## 2013-11-17 LAB — I-STAT CG4 LACTIC ACID, ED: LACTIC ACID, VENOUS: 4.14 mmol/L — AB (ref 0.5–2.2)

## 2013-11-17 SURGERY — CRANIECTOMY FOR DEPRESSED SKULL FRACTURE
Anesthesia: General

## 2013-11-17 MED ORDER — MIDAZOLAM HCL 2 MG/2ML IJ SOLN
2.0000 mg | INTRAMUSCULAR | Status: DC | PRN
  Filled 2013-11-17: qty 2

## 2013-11-17 MED ORDER — FENTANYL CITRATE 0.05 MG/ML IJ SOLN
2500.0000 ug | INTRAMUSCULAR | Status: DC | PRN
  Administered 2013-11-17: 25 ug/h via INTRAVENOUS

## 2013-11-17 MED ORDER — DEXMEDETOMIDINE HCL IN NACL 200 MCG/50ML IV SOLN
0.4000 ug/kg/h | INTRAVENOUS | Status: DC
  Administered 2013-11-17: 0.4 ug/kg/h via INTRAVENOUS
  Filled 2013-11-17: qty 50

## 2013-11-17 MED ORDER — SUCCINYLCHOLINE CHLORIDE 20 MG/ML IJ SOLN
INTRAMUSCULAR | Status: AC
  Filled 2013-11-17: qty 1

## 2013-11-17 MED ORDER — GELATIN ABSORBABLE EX FILM
ORAL_FILM | CUTANEOUS | Status: DC
  Filled 2013-11-17: qty 1

## 2013-11-17 MED ORDER — FENTANYL CITRATE 0.05 MG/ML IJ SOLN
25.0000 ug | INTRAMUSCULAR | Status: DC | PRN
  Administered 2013-11-18 – 2013-11-21 (×28): 25 ug via INTRAVENOUS
  Filled 2013-11-17 (×29): qty 2

## 2013-11-17 MED ORDER — ETOMIDATE 2 MG/ML IV SOLN
INTRAVENOUS | Status: AC
  Filled 2013-11-17: qty 20

## 2013-11-17 MED ORDER — ROCURONIUM BROMIDE 50 MG/5ML IV SOLN
INTRAVENOUS | Status: DC
Start: 2013-11-17 — End: 2013-11-17
  Filled 2013-11-17: qty 2

## 2013-11-17 MED ORDER — PROPOFOL 10 MG/ML IV EMUL: 0.0000 ug/kg/min | INTRAVENOUS | Status: DC

## 2013-11-17 MED ORDER — STERILE WATER FOR INJECTION IJ SOLN
INTRAMUSCULAR | Status: AC
Start: 2013-11-17 — End: 2013-11-17
  Filled 2013-11-17: qty 20

## 2013-11-17 MED ORDER — FENTANYL CITRATE 0.05 MG/ML IJ SOLN
INTRAMUSCULAR | Status: DC | PRN
  Administered 2013-11-17 (×2): 50 ug via INTRAVENOUS

## 2013-11-17 MED ORDER — CETYLPYRIDINIUM CHLORIDE 0.05 % MT LIQD
7.0000 mL | Freq: Four times a day (QID) | OROMUCOSAL | Status: DC
Start: 2013-11-17 — End: 2013-11-23
  Administered 2013-11-17 – 2013-11-23 (×25): 7 mL via OROMUCOSAL

## 2013-11-17 MED ORDER — ALBUMIN HUMAN 5 % IV SOLN
INTRAVENOUS | Status: DC | PRN
  Administered 2013-11-17: 07:00:00 via INTRAVENOUS

## 2013-11-17 MED ORDER — ONDANSETRON HCL 4 MG/2ML IJ SOLN
4.0000 mg | Freq: Four times a day (QID) | INTRAMUSCULAR | Status: DC | PRN
Start: 2013-11-17 — End: 2013-11-23

## 2013-11-17 MED ORDER — 0.9 % SODIUM CHLORIDE (POUR BTL) OPTIME
TOPICAL | Status: DC | PRN
  Administered 2013-11-17 (×3): 1000 mL

## 2013-11-17 MED ORDER — SODIUM CHLORIDE 0.9 % IV SOLN
Freq: Once | INTRAVENOUS | Status: AC
  Administered 2013-11-17: 16:00:00 via INTRAVENOUS

## 2013-11-17 MED ORDER — SODIUM CHLORIDE 0.9 % IV SOLN
INTRAVENOUS | Status: DC | PRN
  Administered 2013-11-17 (×2): via INTRAVENOUS

## 2013-11-17 MED ORDER — FENTANYL CITRATE 0.05 MG/ML IJ SOLN
INTRAMUSCULAR | Status: AC
  Filled 2013-11-17: qty 5

## 2013-11-17 MED ORDER — THROMBIN 20000 UNITS EX SOLR
CUTANEOUS | Status: DC | PRN
  Administered 2013-11-17: 07:00:00 via TOPICAL

## 2013-11-17 MED ORDER — CHLORHEXIDINE GLUCONATE 0.12 % MT SOLN
15.0000 mL | Freq: Two times a day (BID) | OROMUCOSAL | Status: DC
  Administered 2013-11-17 – 2013-11-23 (×13): 15 mL via OROMUCOSAL
  Filled 2013-11-17 (×15): qty 15

## 2013-11-17 MED ORDER — ARTIFICIAL TEARS OP OINT
TOPICAL_OINTMENT | OPHTHALMIC | Status: AC
Start: 2013-11-17 — End: 2013-11-17
  Filled 2013-11-17: qty 3.5

## 2013-11-17 MED ORDER — BISACODYL 10 MG RE SUPP: 10.0000 mg | Freq: Every day | RECTAL | Status: DC | PRN

## 2013-11-17 MED ORDER — SODIUM CHLORIDE 0.9 % IV SOLN
INTRAVENOUS | Status: AC | PRN
  Administered 2013-11-17: 50 mL/h via INTRAVENOUS
  Administered 2013-11-17: 999 mL/h via INTRAVENOUS

## 2013-11-17 MED ORDER — SODIUM CHLORIDE 0.9 % IV SOLN
25.0000 ug/h | INTRAVENOUS | Status: DC
  Administered 2013-11-17: 25 ug/h via INTRAVENOUS
  Filled 2013-11-17: qty 50

## 2013-11-17 MED ORDER — LEVETIRACETAM IN NACL 1000 MG/100ML IV SOLN
1000.0000 mg | Freq: Once | INTRAVENOUS | Status: AC
  Administered 2013-11-17: 1000 mg via INTRAVENOUS
  Filled 2013-11-17: qty 100

## 2013-11-17 MED ORDER — ROCURONIUM BROMIDE 50 MG/5ML IV SOLN
INTRAVENOUS | Status: AC | PRN
  Administered 2013-11-17: 80 mg via INTRAVENOUS

## 2013-11-17 MED ORDER — SODIUM CHLORIDE 0.9 % IV BOLUS (SEPSIS)
1000.0000 mL | Freq: Once | INTRAVENOUS | Status: AC
  Administered 2013-11-17: 1000 mL via INTRAVENOUS

## 2013-11-17 MED ORDER — ROCURONIUM BROMIDE 50 MG/5ML IV SOLN
INTRAVENOUS | Status: AC
  Filled 2013-11-17: qty 1

## 2013-11-17 MED ORDER — MIDAZOLAM HCL 2 MG/2ML IJ SOLN: 2.0000 mg | INTRAMUSCULAR | Status: DC | PRN

## 2013-11-17 MED ORDER — LIDOCAINE HCL (CARDIAC) 20 MG/ML IV SOLN
INTRAVENOUS | Status: AC
  Filled 2013-11-17: qty 5

## 2013-11-17 MED ORDER — BACITRACIN ZINC 500 UNIT/GM EX OINT
TOPICAL_OINTMENT | CUTANEOUS | Status: DC | PRN
  Administered 2013-11-17: 1 via TOPICAL

## 2013-11-17 MED ORDER — PROPOFOL 10 MG/ML IV EMUL
INTRAVENOUS | Status: AC
  Administered 2013-11-17: 20 ug/kg/min via INTRAVENOUS
  Filled 2013-11-17: qty 100

## 2013-11-17 MED ORDER — PHENYLEPHRINE HCL 10 MG/ML IJ SOLN
10.0000 mg | INTRAVENOUS | Status: DC | PRN
  Administered 2013-11-17: 20 ug/min via INTRAVENOUS

## 2013-11-17 MED ORDER — FENTANYL CITRATE 0.05 MG/ML IJ SOLN
50.0000 ug | Freq: Once | INTRAMUSCULAR | Status: AC
  Administered 2013-11-17: 50 ug via INTRAVENOUS

## 2013-11-17 MED ORDER — CEFAZOLIN SODIUM-DEXTROSE 2-3 GM-% IV SOLR
INTRAVENOUS | Status: AC
  Filled 2013-11-17: qty 50

## 2013-11-17 MED ORDER — ONDANSETRON HCL 4 MG PO TABS: 4.0000 mg | ORAL_TABLET | Freq: Four times a day (QID) | ORAL | Status: DC | PRN

## 2013-11-17 MED ORDER — ROCURONIUM BROMIDE 100 MG/10ML IV SOLN
INTRAVENOUS | Status: DC | PRN
  Administered 2013-11-17: 50 mg via INTRAVENOUS
  Administered 2013-11-17: 30 mg via INTRAVENOUS
  Administered 2013-11-17: 20 mg via INTRAVENOUS

## 2013-11-17 MED ORDER — PHENYLEPHRINE HCL 10 MG/ML IJ SOLN
0.0000 ug/min | INTRAVENOUS | Status: DC
  Filled 2013-11-17: qty 1

## 2013-11-17 MED ORDER — EPHEDRINE SULFATE 50 MG/ML IJ SOLN
INTRAMUSCULAR | Status: AC
  Filled 2013-11-17: qty 1

## 2013-11-17 MED ORDER — CEFAZOLIN SODIUM-DEXTROSE 2-3 GM-% IV SOLR
INTRAVENOUS | Status: DC | PRN
  Administered 2013-11-17: 2 g via INTRAVENOUS

## 2013-11-17 MED ORDER — SODIUM CHLORIDE 0.9 % IV BOLUS (SEPSIS)
500.0000 mL | Freq: Once | INTRAVENOUS | Status: AC
Start: 2013-11-17 — End: 2013-11-17
  Administered 2013-11-17: 500 mL via INTRAVENOUS

## 2013-11-17 MED ORDER — ARTIFICIAL TEARS OP OINT
TOPICAL_OINTMENT | OPHTHALMIC | Status: DC | PRN
  Administered 2013-11-17: 1 via OPHTHALMIC

## 2013-11-17 MED ORDER — PHENYLEPHRINE HCL 10 MG/ML IJ SOLN: 10.0000 mg | INTRAVENOUS | Status: DC | PRN

## 2013-11-17 MED ORDER — SODIUM CHLORIDE 0.9 % IV BOLUS (SEPSIS)
500.0000 mL | Freq: Once | INTRAVENOUS | Status: AC
  Administered 2013-11-17: 500 mL via INTRAVENOUS

## 2013-11-17 MED ORDER — PROPOFOL 10 MG/ML IV EMUL
5.0000 ug/kg/min | Freq: Once | INTRAVENOUS | Status: AC
  Administered 2013-11-17: 20 ug/kg/min via INTRAVENOUS
  Filled 2013-11-17: qty 100

## 2013-11-17 MED ORDER — PANTOPRAZOLE SODIUM 40 MG PO TBEC
40.0000 mg | DELAYED_RELEASE_TABLET | Freq: Every day | ORAL | Status: DC
  Administered 2013-11-21: 40 mg via ORAL
  Filled 2013-11-17: qty 1

## 2013-11-17 MED ORDER — PANTOPRAZOLE SODIUM 40 MG IV SOLR
40.0000 mg | Freq: Every day | INTRAVENOUS | Status: DC
Start: 2013-11-17 — End: 2013-11-23
  Administered 2013-11-17 – 2013-11-23 (×6): 40 mg via INTRAVENOUS
  Filled 2013-11-17 (×7): qty 40

## 2013-11-17 MED ORDER — ETOMIDATE 2 MG/ML IV SOLN
INTRAVENOUS | Status: AC | PRN
  Administered 2013-11-17: 20 mg via INTRAVENOUS

## 2013-11-17 MED ORDER — DEXTROSE-NACL 5-0.9 % IV SOLN
INTRAVENOUS | Status: DC
  Administered 2013-11-17: 1000 mL via INTRAVENOUS
  Administered 2013-11-17 – 2013-11-18 (×4): via INTRAVENOUS
  Administered 2013-11-19: 125 mL via INTRAVENOUS
  Administered 2013-11-19 – 2013-11-20 (×3): via INTRAVENOUS

## 2013-11-17 MED ORDER — LACTATED RINGERS IV SOLN
INTRAVENOUS | Status: DC | PRN
Start: 2013-11-17 — End: 2013-11-17
  Administered 2013-11-17: 07:00:00 via INTRAVENOUS

## 2013-11-17 SURGICAL SUPPLY — 39 items
BANDAGE GAUZE 4  KLING STR (GAUZE/BANDAGES/DRESSINGS) ×3 IMPLANT
BNDG GAUZE ELAST 4 BULKY (GAUZE/BANDAGES/DRESSINGS) ×3 IMPLANT
BUR ROUTER D-58 CRANI (BURR) ×3 IMPLANT
CLIP RANEY DISP (INSTRUMENTS) ×3 IMPLANT
CLOTH BEACON ORANGE TIMEOUT ST (SAFETY) ×3 IMPLANT
CONT SPEC 4OZ CLIKSEAL STRL BL (MISCELLANEOUS) ×3 IMPLANT
DRAPE SURG IRRIG POUCH 19X23 (DRAPES) ×3 IMPLANT
ELECT REM PT RETURN 9FT ADLT (ELECTROSURGICAL) ×3
ELECTRODE REM PT RTRN 9FT ADLT (ELECTROSURGICAL) ×1 IMPLANT
FORCEPS BIPOLAR SPETZLER 8 1.0 (NEUROSURGERY SUPPLIES) ×3 IMPLANT
GAUZE SPONGE 4X4 12PLY STRL (GAUZE/BANDAGES/DRESSINGS) ×9 IMPLANT
GLOVE BIO SURGEON STRL SZ7 (GLOVE) ×3 IMPLANT
GLOVE BIOGEL PI IND STRL 7.5 (GLOVE) ×1 IMPLANT
GLOVE BIOGEL PI INDICATOR 7.5 (GLOVE) ×2
GLOVE ECLIPSE 6.5 STRL STRAW (GLOVE) ×6 IMPLANT
GOWN STRL REUS W/ TWL LRG LVL3 (GOWN DISPOSABLE) ×3 IMPLANT
GOWN STRL REUS W/TWL LRG LVL3 (GOWN DISPOSABLE) ×6
GRAFT DURAGEN MATRIX 2WX2L ×3 IMPLANT
HEMOSTAT SURGICEL 2X14 (HEMOSTASIS) ×3 IMPLANT
HOOK DURA (MISCELLANEOUS) ×3 IMPLANT
KIT BASIN OR (CUSTOM PROCEDURE TRAY) ×3 IMPLANT
KIT ROOM TURNOVER OR (KITS) ×3 IMPLANT
NEEDLE HYPO 25X1 1.5 SAFETY (NEEDLE) ×3 IMPLANT
NS IRRIG 1000ML POUR BTL (IV SOLUTION) ×9 IMPLANT
PACK CRANIOTOMY (CUSTOM PROCEDURE TRAY) ×3 IMPLANT
PLATE 1.5  2HOLE MED NEURO (Plate) ×4 IMPLANT
PLATE 1.5 2HOLE MED NEURO (Plate) ×2 IMPLANT
PLATE 1.5/0.6 85X53M SM PANEL (Plate) ×3 IMPLANT
SCREW SELF DRILL HT 1.5/5MM (Screw) ×63 IMPLANT
SPONGE SURGIFOAM ABS GEL 100 (HEMOSTASIS) ×3 IMPLANT
STAPLER VISISTAT 35W (STAPLE) ×6 IMPLANT
SUT ETHILON 3 0 FSL (SUTURE) ×3 IMPLANT
SUT ETHILON 3 0 PS 1 (SUTURE) ×9 IMPLANT
SUT VIC AB 2-0 CT2 18 VCP726D (SUTURE) ×6 IMPLANT
SUT VIC AB 4-0 TF 27 (SUTURE) ×3 IMPLANT
SYR BULB 3OZ (MISCELLANEOUS) ×3 IMPLANT
SYR BULB IRRIGATION 50ML (SYRINGE) ×3 IMPLANT
TOWEL OR 17X24 6PK STRL BLUE (TOWEL DISPOSABLE) ×3 IMPLANT
TOWEL OR 17X26 10 PK STRL BLUE (TOWEL DISPOSABLE) ×3 IMPLANT

## 2013-11-17 NOTE — Progress Notes (Signed)
Attempted SBT at this time for Dr. Lindie SpruceWyatt. Pt had no effort. RT will try again when pt more awake.
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
## 2013-11-17 NOTE — Anesthesia Preprocedure Evaluation (Addendum)
Anesthesia Evaluation  Patient identified by MRN, date of birth, ID band Patient unresponsive    Reviewed: Unable to perform ROS - Chart review only  Airway      Comment: Pt intubated prior to arrival to OR Dental   Pulmonary neg pulmonary ROS,    Pulmonary exam normal       Cardiovascular negative cardio ROS      Neuro/Psych negative neurological ROS     GI/Hepatic negative GI ROS, Neg liver ROS,   Endo/Other    Renal/GU negative Renal ROS     Musculoskeletal   Abdominal   Peds  Hematology   Anesthesia Other Findings   Reproductive/Obstetrics                          Anesthesia Physical Anesthesia Plan  ASA: IV and emergent  Anesthesia Plan: General   Post-op Pain Management:    Induction: Intravenous  Airway Management Planned: Oral ETT  Additional Equipment:   Intra-op Plan:   Post-operative Plan: Post-operative intubation/ventilation  Informed Consent:   Only emergency history available  Plan Discussed with: CRNA and Anesthesiologist  Anesthesia Plan Comments:        Anesthesia Quick Evaluation
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
## 2013-11-17 NOTE — Progress Notes (Signed)
TO OR at 0620 with vent and cardiac monitor
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
## 2013-11-17 NOTE — Anesthesia Postprocedure Evaluation (Signed)
Anesthesia Post Note  Patient: Bruce [name]  Procedure(s) Performed: Procedure(s) (LRB): CRANIECTOMY FOR DEPRESSED SKULL FRACTURE (N/A)  Anesthesia type: General  Patient location: ICU  Post pain: Pain level controlled  Post assessment: Post-op Vital signs reviewed  Last Vitals:  Filed Vitals:   11/17/13 1105  BP:   Pulse: 67  Temp:   Resp: 10    Post vital signs: stable  Level of consciousness: Patient remains intubated per anesthesia plan  Complications: No apparent anesthesia complications
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
## 2013-11-17 NOTE — ED Notes (Signed)
To CT
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
## 2013-11-17 NOTE — Progress Notes (Signed)
Patient in OR will do vent check when pt returns.

## 2013-11-17 NOTE — Progress Notes (Signed)
Wasted 225mL of Fentanyl in sink.  Rulon EisenmengerHeather Satterfield, RN witnessed.

## 2013-11-17 NOTE — Transfer of Care (Signed)
Immediate Anesthesia Transfer of Care Note  Patient: Bruce [name]  Procedure(s) Performed: Procedure(s) with comments: CRANIECTOMY FOR DEPRESSED SKULL FRACTURE (N/A) - CRANIECTOMY FOR DEPRESSED SKULL FRACTURE  Patient Location: ICU  Anesthesia Type:General  Level of Consciousness: sedated, unresponsive and Patient remains intubated per anesthesia plan  Airway & Oxygen Therapy: Patient remains intubated per anesthesia plan and Patient placed on Ventilator (see vital sign flow sheet for setting)  Post-op Assessment: Report given to PACU RN and Post -op Vital signs reviewed and stable  Post vital signs: Reviewed and stable  Complications: No apparent anesthesia complications

## 2013-11-17 NOTE — ED Notes (Signed)
CG-4 and Chem-8 reported to Dr. Mora Bellmanni

## 2013-11-17 NOTE — Procedures (Signed)
Extubation Procedure Note  Patient Details:   Name: Bruce [name] DOB: July 31, 1980 MRN: [id number]   Airway Documentation:     Evaluation  O2 sats: stable throughout Complications: No apparent complications Patient did tolerate procedure well. Bilateral Breath Sounds: Rhonchi   No  Pt was extubated per order Dr. Lindie SpruceWyatt. Patient was placed on 4 LPM nasal cannula. Pt was not able to vocalize name or location. Pt had good air movement but did have some rhonchi BBS. No stridor noted. RT will continue to monitor.   Errica Dutil M 11/17/2013, 11:57 AM
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
## 2013-11-17 NOTE — Progress Notes (Signed)
Patient's sister, Nigel MormonHelen Rodas returned calls.  At this moment she is the only next of kin that we have been made aware of.  Sister states that both parents are deceased.  Patient's sister is on her way to the hospital and the nurse will then further question regarding patient's marital status or other relatives.

## 2013-11-17 NOTE — ED Notes (Signed)
PROPOFOL STARTED AT 30 MCG PER VERBAL ORDER FROM DR. BYERLY.

## 2013-11-18 ENCOUNTER — Inpatient Hospital Stay (HOSPITAL_COMMUNITY): Payer: Medicaid Other

## 2013-11-18 LAB — TYPE AND SCREEN
ABO/RH(D): B POS
Antibody Screen: NEGATIVE
UNIT DIVISION: 0
Unit division: 0
Unit division: 0
Unit division: 0

## 2013-11-18 LAB — PROTIME-INR
INR: 1.15 (ref 0.00–1.49)
Prothrombin Time: 14.7 seconds (ref 11.6–15.2)

## 2013-11-18 LAB — HEMOGLOBIN AND HEMATOCRIT, BLOOD
HCT: 25.4 % — ABNORMAL LOW (ref 39.0–52.0)
Hemoglobin: 8.7 g/dL — ABNORMAL LOW (ref 13.0–17.0)

## 2013-11-18 LAB — ABO/RH: ABO/RH(D): B POS

## 2013-11-18 MED ORDER — MUPIROCIN 2 % EX OINT
1.0000 "application " | TOPICAL_OINTMENT | Freq: Two times a day (BID) | CUTANEOUS | Status: AC
  Administered 2013-11-18 – 2013-11-22 (×10): 1 via NASAL
  Filled 2013-11-18 (×2): qty 22

## 2013-11-18 MED ORDER — CHLORHEXIDINE GLUCONATE CLOTH 2 % EX PADS
6.0000 | MEDICATED_PAD | Freq: Every day | CUTANEOUS | Status: AC
  Administered 2013-11-19 – 2013-11-22 (×4): 6 via TOPICAL
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
## 2013-11-18 NOTE — Progress Notes (Signed)
Seen agree.   No change in plan at this point.
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
## 2013-11-19 LAB — PREPARE FRESH FROZEN PLASMA
Unit division: 0
Unit division: 0
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
## 2013-11-19 NOTE — Progress Notes (Signed)
SLP Cancellation Note  Patient Details Name: Bruce [name] MRN: [id number] DOB: 04/18/1980   Cancelled treatment:       Reason Eval/Treat Not Completed: Fatigue/lethargy limiting ability to participate (Pt just received pain meds. RN asked SLP to return in 1-2 hr)   Kip Cropp Meryl 11/19/2013, 11:43 AM
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
## 2013-11-19 NOTE — Clinical Social Work Note (Signed)
CSW received consult. CSW contacted patient's sister Nigel MormonHelen Windmiller [phone] and left messages x2 for follow-up. CSW unable to assess patient at time due to cognitive impairment. CSW to follow tomorrow.  Oviya Ammar Patrick-Jefferson, LCSWA Weekend Clinical Social Worker [phone]

## 2013-11-19 NOTE — Progress Notes (Signed)
Agree with speech eval, not sure this will be successful, likely needs peg this week. He can go to stepdown

## 2013-11-20 ENCOUNTER — Encounter (HOSPITAL_COMMUNITY): Payer: Self-pay | Admitting: Neurosurgery

## 2013-11-20 LAB — BASIC METABOLIC PANEL
Anion gap: 12 (ref 5–15)
BUN: 3 mg/dL — AB (ref 6–23)
CHLORIDE: 103 meq/L (ref 96–112)
CO2: 25 meq/L (ref 19–32)
Calcium: 8 mg/dL — ABNORMAL LOW (ref 8.4–10.5)
Creatinine, Ser: 0.57 mg/dL (ref 0.50–1.35)
GFR calc Af Amer: >90 mL/min (ref >90)
GFR calc non Af Amer: >90 mL/min (ref >90)
Glucose, Bld: 109 mg/dL — ABNORMAL HIGH (ref 70–99)
POTASSIUM: 3.5 meq/L — AB (ref 3.7–5.3)
Sodium: 140 mEq/L (ref 137–147)

## 2013-11-20 MED ORDER — SODIUM CHLORIDE 0.9 % IV SOLN
INTRAVENOUS | Status: DC
  Administered 2013-11-20 – 2013-11-22 (×5): via INTRAVENOUS
  Filled 2013-11-20 (×6): qty 1000
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
## 2013-11-20 NOTE — Progress Notes (Signed)
Patient ID: Bruce [name], male   DOB: [birth year], 33 y.o.   MRN: [id number] BP 110/60  Pulse 49  Temp(Src) 98.7 F (37.1 C) (Oral)  Resp 14  Ht 5\' 10"  (1.778 m)  Wt 51.7 kg (113 lb 15.7 oz)  BMI 16.35 kg/m2  SpO2 100% Alert, non fluent, not consistently following commands Densely plegic on right side Wound is clean, dry, without signs of infection Dysarthria, able to say a few words  Exam slowly improving,

## 2013-11-20 NOTE — Progress Notes (Signed)
Patient ID: Bruce [name], male   DOB: Feb 14, 1981, 33 y.o.   MRN: [id number] Previously documented [name]. Violeta GelinasBurke Kanai Berrios, MD, MPH, FACS Trauma: [phone] General Surgery: [phone]
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
## 2013-11-20 NOTE — Progress Notes (Signed)
Rehab Admissions Coordinator Note:  Patient was screened by Clois DupesBoyette, Carnel Stegman Godwin for appropriateness for an Inpatient Acute Rehab Consult per TBI team recommendation.  At this time, we are recommending Inpatient Rehab consult.Final dispo will rely a lot on caregiver support and his Ranchos level progression. I will contact Trauma.  Clois DupesBoyette, Shaila Gilchrest Godwin 11/20/2013, 8:37 AM  I can be reached at [phone].

## 2013-11-20 NOTE — Progress Notes (Signed)
I have left a message for pt's sister to contact me to discuss his rehab venue options. [phone]
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
## 2013-11-21 ENCOUNTER — Inpatient Hospital Stay (HOSPITAL_COMMUNITY): Payer: Medicaid Other

## 2013-11-21 DIAGNOSIS — T839XXA Unspecified complication of genitourinary prosthetic device, implant and graft, initial encounter: Secondary | ICD-10-CM | POA: Diagnosis not present

## 2013-11-21 DIAGNOSIS — F1010 Alcohol abuse, uncomplicated: Secondary | ICD-10-CM | POA: Diagnosis present

## 2013-11-21 DIAGNOSIS — D62 Acute posthemorrhagic anemia: Secondary | ICD-10-CM | POA: Diagnosis present

## 2013-11-21 DIAGNOSIS — S81039A Puncture wound without foreign body, unspecified knee, initial encounter: Secondary | ICD-10-CM

## 2013-11-21 DIAGNOSIS — W3400XA Accidental discharge from unspecified firearms or gun, initial encounter: Secondary | ICD-10-CM

## 2013-11-21 LAB — BASIC METABOLIC PANEL
Anion gap: 12 (ref 5–15)
BUN: 5 mg/dL — AB (ref 6–23)
CALCIUM: 8.3 mg/dL — AB (ref 8.4–10.5)
CO2: 25 meq/L (ref 19–32)
Chloride: 103 mEq/L (ref 96–112)
Creatinine, Ser: 0.59 mg/dL (ref 0.50–1.35)
GFR calc Af Amer: >90 mL/min (ref >90)
GFR calc non Af Amer: >90 mL/min (ref >90)
GLUCOSE: 109 mg/dL — AB (ref 70–99)
Potassium: 3.5 mEq/L — ABNORMAL LOW (ref 3.7–5.3)
Sodium: 140 mEq/L (ref 137–147)

## 2013-11-21 LAB — CBC
HEMATOCRIT: 29.5 % — AB (ref 39.0–52.0)
HEMOGLOBIN: 10.2 g/dL — AB (ref 13.0–17.0)
MCH: 30.8 pg (ref 26.0–34.0)
MCHC: 34.6 g/dL (ref 30.0–36.0)
MCV: 89.1 fL (ref 78.0–100.0)
Platelets: 173 10*3/uL (ref 150–400)
RBC: 3.31 MIL/uL — AB (ref 4.22–5.81)
RDW: 13.1 % (ref 11.5–15.5)
WBC: 7.3 10*3/uL (ref 4.0–10.5)

## 2013-11-21 MED ORDER — MORPHINE SULFATE 2 MG/ML IJ SOLN
2.0000 mg | INTRAMUSCULAR | Status: DC | PRN
  Administered 2013-11-21 – 2013-11-23 (×8): 2 mg via INTRAVENOUS
  Filled 2013-11-21 (×8): qty 1

## 2013-11-21 MED ORDER — TRAMADOL HCL 50 MG PO TABS
50.0000 mg | ORAL_TABLET | Freq: Four times a day (QID) | ORAL | Status: DC | PRN
  Administered 2013-11-21 – 2013-11-22 (×5): 100 mg via ORAL
  Administered 2013-11-23: 50 mg via ORAL
  Administered 2013-11-23: 100 mg via ORAL
  Filled 2013-11-21: qty 2
  Filled 2013-11-21: qty 1
  Filled 2013-11-21 (×5): qty 2

## 2013-11-21 MED ORDER — AMANTADINE HCL 100 MG PO CAPS
100.0000 mg | ORAL_CAPSULE | Freq: Two times a day (BID) | ORAL | Status: DC
  Administered 2013-11-21 – 2013-11-23 (×5): 100 mg via ORAL
  Filled 2013-11-21 (×6): qty 1

## 2013-11-21 MED ORDER — HYDROMORPHONE HCL 1 MG/ML IJ SOLN
INTRAMUSCULAR | Status: AC
  Administered 2013-11-21: 1 mg
  Filled 2013-11-21: qty 1

## 2013-11-21 MED ORDER — DOCUSATE SODIUM 100 MG PO CAPS
100.0000 mg | ORAL_CAPSULE | Freq: Two times a day (BID) | ORAL | Status: DC
  Administered 2013-11-21 – 2013-11-23 (×3): 100 mg via ORAL
  Filled 2013-11-21: qty 1

## 2013-11-21 MED ORDER — POLYETHYLENE GLYCOL 3350 17 G PO PACK
17.0000 g | PACK | Freq: Every day | ORAL | Status: DC
  Administered 2013-11-21 – 2013-11-23 (×3): 17 g via ORAL
  Filled 2013-11-21 (×3): qty 1
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
## 2013-11-21 NOTE — Progress Notes (Signed)
I certify that Bruce [name] will likely be disabled for greater than 1 year.    Freeman CaldronMichael J. Rhemi Balbach, PA-C Pager: [phone] General Trauma PA Pager: [phone]
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
## 2013-11-21 NOTE — Progress Notes (Signed)
Performed in and out catheter received immediate blood return removed catheter.  Contacted MD Coude catheter placed by Earney HamburgMichael Jeffrey MD.  Immediate return of 700ml urine.

## 2013-11-21 NOTE — Progress Notes (Signed)
Foley removed per MD order.  Pt. Tolerated well. Silvio PateBarbara Naveya Ellerman, RN.

## 2013-11-21 NOTE — Progress Notes (Signed)
Supervised and reviewed by Herman Fiero [name]  

## 2013-11-21 NOTE — Progress Notes (Signed)
Patient ID: Bruce [name], male   DOB: 24-Mar-1980, 33 y.o.   MRN: [id number] BP 116/78  Pulse 49  Temp(Src) 97.9 F (36.6 C) (Axillary)  Resp 16  Ht 5\' 3"  (1.6 m)  Wt 50.531 kg (111 lb 6.4 oz)  BMI 19.74 kg/m2  SpO2 100% Alert, speaks clearly at times, non fluent, not following commands consistently Moving left side well Dense plegia on right side.  Wound is clean, dry.  Continue with therapy, await rehab
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
## 2013-11-21 NOTE — Progress Notes (Signed)
I have left another message for pt's sister to contact me to discuss caregiver supports at home. [phone]

## 2013-11-22 DIAGNOSIS — R338 Other retention of urine: Secondary | ICD-10-CM | POA: Diagnosis present

## 2013-11-22 DIAGNOSIS — J9600 Acute respiratory failure, unspecified whether with hypoxia or hypercapnia: Secondary | ICD-10-CM | POA: Diagnosis present

## 2013-11-22 MED ORDER — TAMSULOSIN HCL 0.4 MG PO CAPS
0.8000 mg | ORAL_CAPSULE | Freq: Every day | ORAL | Status: DC
  Administered 2013-11-22 – 2013-11-23 (×2): 0.8 mg via ORAL
  Filled 2013-11-22 (×2): qty 2

## 2013-11-22 MED ORDER — BETHANECHOL CHLORIDE 25 MG PO TABS
25.0000 mg | ORAL_TABLET | Freq: Four times a day (QID) | ORAL | Status: DC
  Administered 2013-11-22 – 2013-11-23 (×7): 25 mg via ORAL
  Filled 2013-11-22 (×8): qty 1
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
## 2013-11-22 NOTE — Progress Notes (Signed)
Patient ID: Bruce [name], male   DOB: 09-07-1980, 33 y.o.   MRN: [id number] BP 109/65  Pulse 78  Temp(Src) 98.2 F (36.8 C) (Oral)  Resp 16  Ht 5\' 3"  (1.6 m)  Wt 50.531 kg (111 lb 6.4 oz)  BMI 19.74 kg/m2  SpO2 100% Alert, following some commands Speech is non fluent Has some expressive aphasia Right hemiparesis Wound is clean, dry, no signs of infection Improving neurologic status  Await rehab

## 2013-11-22 NOTE — Progress Notes (Signed)
I agree with the PA that this patient should not be placed in a SNF to languish because of his family situation.  Clinically he seem completely appropriate for CIR.  He is stable and will have to keep his Foley for several days.  This patient has been seen and I agree with the findings and treatment plan.  Marta LamasJames O. Gae BonWyatt, III, MD, FACS [phone] (pager) [phone] (direct pager) Trauma Surgeon
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
## 2013-11-23 ENCOUNTER — Inpatient Hospital Stay (HOSPITAL_COMMUNITY)
Admission: RE | Admit: 2013-11-23 | Discharge: 2013-12-19 | DRG: 948 | Disposition: A | Discharge status: Home / Self Care | Payer: Medicaid Other | Source: Intra-hospital | Attending: Physical Medicine & Rehabilitation | Admitting: Physical Medicine & Rehabilitation

## 2013-11-23 DIAGNOSIS — R319 Hematuria, unspecified: Secondary | ICD-10-CM | POA: Diagnosis present

## 2013-11-23 DIAGNOSIS — S0190XA Unspecified open wound of unspecified part of head, initial encounter: Secondary | ICD-10-CM

## 2013-11-23 DIAGNOSIS — R4701 Aphasia: Secondary | ICD-10-CM | POA: Diagnosis present

## 2013-11-23 DIAGNOSIS — R52 Pain, unspecified: Principal | ICD-10-CM | POA: Diagnosis present

## 2013-11-23 DIAGNOSIS — S069X0D Unspecified intracranial injury without loss of consciousness, subsequent encounter: Secondary | ICD-10-CM | POA: Diagnosis not present

## 2013-11-23 DIAGNOSIS — S81031S Puncture wound without foreign body, right knee, sequela: Secondary | ICD-10-CM

## 2013-11-23 DIAGNOSIS — R339 Retention of urine, unspecified: Secondary | ICD-10-CM | POA: Diagnosis present

## 2013-11-23 DIAGNOSIS — S069X5S Unspecified intracranial injury with loss of consciousness greater than 24 hours with return to pre-existing conscious level, sequela: Secondary | ICD-10-CM

## 2013-11-23 DIAGNOSIS — S069XAA Unspecified intracranial injury with loss of consciousness status unknown, initial encounter: Secondary | ICD-10-CM | POA: Diagnosis present

## 2013-11-23 DIAGNOSIS — F1010 Alcohol abuse, uncomplicated: Secondary | ICD-10-CM

## 2013-11-23 DIAGNOSIS — R338 Other retention of urine: Secondary | ICD-10-CM

## 2013-11-23 DIAGNOSIS — W3400XD Accidental discharge from unspecified firearms or gun, subsequent encounter: Secondary | ICD-10-CM

## 2013-11-23 DIAGNOSIS — R451 Restlessness and agitation: Secondary | ICD-10-CM | POA: Diagnosis present

## 2013-11-23 DIAGNOSIS — S0291XD Unspecified fracture of skull, subsequent encounter for fracture with routine healing: Secondary | ICD-10-CM

## 2013-11-23 DIAGNOSIS — S069X9A Unspecified intracranial injury with loss of consciousness of unspecified duration, initial encounter: Secondary | ICD-10-CM | POA: Diagnosis present

## 2013-11-23 DIAGNOSIS — D62 Acute posthemorrhagic anemia: Secondary | ICD-10-CM

## 2013-11-23 DIAGNOSIS — G8111 Spastic hemiplegia affecting right dominant side: Secondary | ICD-10-CM

## 2013-11-23 DIAGNOSIS — S069X4S Unspecified intracranial injury with loss of consciousness of 6 hours to 24 hours, sequela: Secondary | ICD-10-CM

## 2013-11-23 DIAGNOSIS — S069X5A Unspecified intracranial injury with loss of consciousness greater than 24 hours with return to pre-existing conscious level, initial encounter: Secondary | ICD-10-CM

## 2013-11-23 DIAGNOSIS — S0193XD Puncture wound without foreign body of unspecified part of head, subsequent encounter: Secondary | ICD-10-CM

## 2013-11-23 DIAGNOSIS — W3400XS Accidental discharge from unspecified firearms or gun, sequela: Secondary | ICD-10-CM

## 2013-11-23 DIAGNOSIS — S0193XS Puncture wound without foreign body of unspecified part of head, sequela: Secondary | ICD-10-CM

## 2013-11-23 MED ORDER — ACETAMINOPHEN 325 MG PO TABS
325.0000 mg | ORAL_TABLET | ORAL | Status: DC | PRN
  Administered 2013-11-25 – 2013-12-12 (×7): 650 mg via ORAL
  Filled 2013-11-23 (×9): qty 2

## 2013-11-23 MED ORDER — ONDANSETRON HCL 4 MG/2ML IJ SOLN: 4.0000 mg | Freq: Four times a day (QID) | INTRAMUSCULAR | Status: DC | PRN

## 2013-11-23 MED ORDER — POLYETHYLENE GLYCOL 3350 17 G PO PACK
17.0000 g | PACK | Freq: Every day | ORAL | Status: DC
  Administered 2013-11-24 – 2013-12-19 (×24): 17 g via ORAL
  Filled 2013-11-23 (×28): qty 1

## 2013-11-23 MED ORDER — DOCUSATE SODIUM 100 MG PO CAPS
100.0000 mg | ORAL_CAPSULE | Freq: Two times a day (BID) | ORAL | Status: DC
  Administered 2013-11-23: 100 mg via ORAL
  Filled 2013-11-23 (×4): qty 1

## 2013-11-23 MED ORDER — SORBITOL 70 % SOLN: 30.0000 mL | Freq: Every day | Status: DC | PRN

## 2013-11-23 MED ORDER — BISACODYL 10 MG RE SUPP: 10.0000 mg | Freq: Every day | RECTAL | Status: DC | PRN

## 2013-11-23 MED ORDER — CETYLPYRIDINIUM CHLORIDE 0.05 % MT LIQD
7.0000 mL | Freq: Four times a day (QID) | OROMUCOSAL | Status: DC
  Administered 2013-11-24 – 2013-12-19 (×40): 7 mL via OROMUCOSAL

## 2013-11-23 MED ORDER — PANTOPRAZOLE SODIUM 40 MG IV SOLR
40.0000 mg | Freq: Every day | INTRAVENOUS | Status: DC
  Filled 2013-11-23 (×2): qty 40

## 2013-11-23 MED ORDER — TAMSULOSIN HCL 0.4 MG PO CAPS
0.8000 mg | ORAL_CAPSULE | Freq: Every day | ORAL | Status: DC
  Administered 2013-11-24 – 2013-12-19 (×26): 0.8 mg via ORAL
  Filled 2013-11-23 (×28): qty 2

## 2013-11-23 MED ORDER — PANTOPRAZOLE SODIUM 40 MG PO TBEC: 40.0000 mg | DELAYED_RELEASE_TABLET | Freq: Every day | ORAL | Status: DC

## 2013-11-23 MED ORDER — AMANTADINE HCL 100 MG PO CAPS
100.0000 mg | ORAL_CAPSULE | Freq: Two times a day (BID) | ORAL | Status: DC
  Administered 2013-11-23 – 2013-11-27 (×7): 100 mg via ORAL
  Filled 2013-11-23 (×10): qty 1

## 2013-11-23 MED ORDER — CHLORHEXIDINE GLUCONATE 0.12 % MT SOLN
15.0000 mL | Freq: Two times a day (BID) | OROMUCOSAL | Status: DC
  Administered 2013-11-24 – 2013-12-19 (×46): 15 mL via OROMUCOSAL
  Filled 2013-11-23 (×54): qty 15

## 2013-11-23 MED ORDER — WHITE PETROLATUM GEL
Status: AC
  Administered 2013-11-23: 0.2
  Filled 2013-11-23: qty 5

## 2013-11-23 MED ORDER — TRAMADOL HCL 50 MG PO TABS
50.0000 mg | ORAL_TABLET | Freq: Four times a day (QID) | ORAL | Status: DC | PRN
  Administered 2013-11-24: 50 mg via ORAL
  Administered 2013-11-24 – 2013-11-26 (×4): 100 mg via ORAL
  Administered 2013-11-27: 50 mg via ORAL
  Administered 2013-11-28 – 2013-11-30 (×7): 100 mg via ORAL
  Administered 2013-12-01 (×2): 50 mg via ORAL
  Administered 2013-12-10 – 2013-12-11 (×3): 100 mg via ORAL
  Administered 2013-12-11: 50 mg via ORAL
  Administered 2013-12-11 – 2013-12-16 (×10): 100 mg via ORAL
  Administered 2013-12-17: 50 mg via ORAL
  Administered 2013-12-17 – 2013-12-18 (×2): 100 mg via ORAL
  Filled 2013-11-23 (×2): qty 2
  Filled 2013-11-23: qty 1
  Filled 2013-11-23 (×4): qty 2
  Filled 2013-11-23: qty 1
  Filled 2013-11-23 (×7): qty 2
  Filled 2013-11-23: qty 1
  Filled 2013-11-23 (×7): qty 2
  Filled 2013-11-23: qty 1
  Filled 2013-11-23 (×9): qty 2
  Filled 2013-11-23: qty 1
  Filled 2013-11-23 (×2): qty 2

## 2013-11-23 MED ORDER — ONDANSETRON HCL 4 MG PO TABS
4.0000 mg | ORAL_TABLET | Freq: Four times a day (QID) | ORAL | Status: DC | PRN
  Administered 2013-12-10: 4 mg via ORAL
  Filled 2013-11-23 (×2): qty 1

## 2013-11-23 MED ORDER — BETHANECHOL CHLORIDE 25 MG PO TABS
25.0000 mg | ORAL_TABLET | Freq: Four times a day (QID) | ORAL | Status: DC
  Administered 2013-11-23 – 2013-12-05 (×45): 25 mg via ORAL
  Filled 2013-11-23 (×58): qty 1

## 2013-11-23 NOTE — Discharge Summary (Signed)
Johnasia Liese, MD, MPH, FACS Trauma: [phone] General Surgery: [phone]  
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
## 2013-11-23 NOTE — Progress Notes (Signed)
Rehab is re-evaluating. Appreciate their input. Worked well with hterapies today. Patient examined and I agree with the assessment and plan  Violeta GelinasBurke Hadlei Stitt, MD, MPH, FACS Trauma: [phone] General Surgery: [phone]  11/23/2013 12:53 PM
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
## 2013-11-23 NOTE — Progress Notes (Signed)
I am contacting sister to discuss admitting pt to inpt rehab today with plans for an inpt rehab stay then SNF placement after rehab progress. Trauma PA, RN CM, and SW made aware. I can admit pt today once I get approval from his sister. [phone]

## 2013-11-24 ENCOUNTER — Inpatient Hospital Stay (HOSPITAL_COMMUNITY): Payer: Self-pay | Admitting: Occupational Therapy

## 2013-11-24 ENCOUNTER — Inpatient Hospital Stay (HOSPITAL_COMMUNITY): Payer: Medicaid Other | Admitting: Speech Pathology

## 2013-11-24 ENCOUNTER — Encounter (HOSPITAL_COMMUNITY): Payer: Self-pay | Admitting: Occupational Therapy

## 2013-11-24 ENCOUNTER — Inpatient Hospital Stay (HOSPITAL_COMMUNITY): Payer: Medicaid Other | Admitting: Physical Therapy

## 2013-11-24 ENCOUNTER — Inpatient Hospital Stay (HOSPITAL_COMMUNITY): Payer: Medicaid Other

## 2013-11-24 DIAGNOSIS — F1010 Alcohol abuse, uncomplicated: Secondary | ICD-10-CM

## 2013-11-24 LAB — CBC WITH DIFFERENTIAL/PLATELET
BASOS ABS: 0 10*3/uL (ref 0.0–0.1)
BASOS PCT: 0 % (ref 0–1)
EOS ABS: 0 10*3/uL (ref 0.0–0.7)
Eosinophils Relative: 0 % (ref 0–5)
HEMATOCRIT: 34.6 % — AB (ref 39.0–52.0)
Hemoglobin: 12.1 g/dL — ABNORMAL LOW (ref 13.0–17.0)
LYMPHS PCT: 15 % (ref 12–46)
Lymphs Abs: 1.2 10*3/uL (ref 0.7–4.0)
MCH: 30.5 pg (ref 26.0–34.0)
MCHC: 35 g/dL (ref 30.0–36.0)
MCV: 87.2 fL (ref 78.0–100.0)
MONO ABS: 0.9 10*3/uL (ref 0.1–1.0)
Monocytes Relative: 11 % (ref 3–12)
Neutro Abs: 5.8 10*3/uL (ref 1.7–7.7)
Neutrophils Relative %: 74 % (ref 43–77)
PLATELETS: 264 10*3/uL (ref 150–400)
RBC: 3.97 MIL/uL — ABNORMAL LOW (ref 4.22–5.81)
RDW: 13 % (ref 11.5–15.5)
WBC: 7.9 10*3/uL (ref 4.0–10.5)

## 2013-11-24 LAB — COMPREHENSIVE METABOLIC PANEL
ALBUMIN: 3.8 g/dL (ref 3.5–5.2)
ALT: 30 U/L (ref 0–53)
AST: 21 U/L (ref 0–37)
Alkaline Phosphatase: 82 U/L (ref 39–117)
Anion gap: 16 — ABNORMAL HIGH (ref 5–15)
BUN: 17 mg/dL (ref 6–23)
CO2: 24 mEq/L (ref 19–32)
Calcium: 9.4 mg/dL (ref 8.4–10.5)
Chloride: 92 mEq/L — ABNORMAL LOW (ref 96–112)
Creatinine, Ser: 0.74 mg/dL (ref 0.50–1.35)
GFR calc Af Amer: >90 mL/min (ref >90)
Glucose, Bld: 111 mg/dL — ABNORMAL HIGH (ref 70–99)
Potassium: 4.2 mEq/L (ref 3.7–5.3)
SODIUM: 132 meq/L — AB (ref 137–147)
TOTAL PROTEIN: 8 g/dL (ref 6.0–8.3)
Total Bilirubin: 1 mg/dL (ref 0.3–1.2)

## 2013-11-24 MED ORDER — TRAZODONE HCL 50 MG PO TABS
50.0000 mg | ORAL_TABLET | Freq: Once | ORAL | Status: AC
  Administered 2013-11-24: 50 mg via ORAL
  Filled 2013-11-24: qty 1

## 2013-11-24 MED ORDER — CARBAMAZEPINE 200 MG PO TABS
200.0000 mg | ORAL_TABLET | Freq: Three times a day (TID) | ORAL | Status: DC
  Administered 2013-11-24 – 2013-12-19 (×76): 200 mg via ORAL
  Filled 2013-11-24 (×80): qty 1

## 2013-11-24 MED ORDER — PANTOPRAZOLE SODIUM 40 MG PO PACK
40.0000 mg | PACK | Freq: Every day | ORAL | Status: DC
  Administered 2013-11-24 – 2013-12-02 (×9): 40 mg via ORAL
  Filled 2013-11-24 (×11): qty 20

## 2013-11-24 MED ORDER — OXYCODONE HCL 5 MG PO TABS
5.0000 mg | ORAL_TABLET | Freq: Four times a day (QID) | ORAL | Status: DC | PRN
  Administered 2013-11-24 – 2013-12-19 (×18): 5 mg via ORAL
  Filled 2013-11-24 (×20): qty 1

## 2013-11-24 MED ORDER — DOCUSATE SODIUM 50 MG/5ML PO LIQD
100.0000 mg | Freq: Two times a day (BID) | ORAL | Status: DC
  Administered 2013-11-24 – 2013-12-02 (×14): 100 mg via ORAL
  Filled 2013-11-24 (×19): qty 10

## 2013-11-24 NOTE — Progress Notes (Signed)
Patient information reviewed and entered into eRehab system by Lourie Retz, RN, CRRN, PPS Coordinator.  Information including medical coding and functional independence measure will be reviewed and updated through discharge.    
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
## 2013-11-24 NOTE — Progress Notes (Signed)
Patients family asked if we could keep all 4 side rails up in bed.  Will continue to monitor.  Dani Gobbleeardon, Hayven Fatima J, RN
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
## 2013-11-24 NOTE — Progress Notes (Signed)
Occupational Therapy Session Note  Patient Details  Name: Bruce [name] XXXSmith MRN: [id number] Date of Birth: 1980/11/01  Today's Date: 11/24/2013 OT Individual Time: 1300-1330 OT Individual Time Calculation (min): 30 min    Short Term Goals: Week 1:     Skilled Therapeutic Interventions/Progress Updates:  ADL-retraining with emphasis on improved awareness, attention to right, bed mobility, static sitting, self-feeding, family education.   Pt received supine in bed, oriented to self, with sister and her fiance in room.   Pt responded appropriately to therapist intermittently due to pain and tolerated assisted bed mobility to rise to sit at edge of bed.    With lunch tray and max assist to support trunk (right, posterior) during static sitting pt presented as requested to self-feed but was unable to tolerate sitting after 30 seconds.   Pt required max assist to reposition in bed.   OT then completed gentle PROM of right hand, wrist and elbow (flexion/extension).   Pt tolerated touch and positional changes well however right shoulder PROM provoked increased pain.   OT educated sister and her fiance on facility resources (laundry) and advised on need for loose-fitting clothing, memory/orientation aids, and on methods of decreasing stimulation within environment of care.   Sister reported plan to bring pictures, poster, and clothing as soon as possible.   With OT supervising, sister provided 1 cup of water which handled and sipped 3 times before ceasing and closing his eyes, requesting no further treatment d/t pain/fatigue.  Per sister's fiance, sister's presence in the facility is complicated by the memory of her mother and father's deaths here.   OT offered to alert staff to monitor sister's affect during visits.  Therapy Documentation Precautions:  Precautions Precautions: Fall Precaution Comments: Head wound Restrictions Weight Bearing Restrictions: No  General: General Chart Reviewed: Yes OT  Amount of Missed Time: 30 Minutes Family/Caregiver Present: Yes, [name] (sister) and Daphine DeutscherMartin (sister's fiance).  Pain: Pain Assessment Faces Pain Scale: Hurts more Pain Type: Acute pain Pain Location: Head Pain Onset: With Activity Pain Intervention(s): Repositioned;Rest  ADL: ADL ADL Comments: see FIM  See FIM for current functional status  Therapy/Group: Individual Therapy  Kadi Hession 11/24/2013, 2:16 PM

## 2013-11-24 NOTE — Progress Notes (Signed)
Occupational Therapy Session Note  Patient Details  Name: Bruce [name] MRN: [id number] Date of Birth: 1981/02/14  Today's Date: 11/24/2013 OT Individual Time: 1131-1205 OT Individual Time Calculation (min): 34 min    Skilled Therapeutic Interventions/Progress Updates:   Pt seen in bed with sister and her fiancee present for session.  Pt with decreased visual scanning to the left side to locate therapist and tended to keep his head turned to the left or midline.  He demonstrated moderate difficulty stating his sister's name but was able to get it out legibly with multiple attempts.  He was not however able to state her fiancee's name.  Pt's RUE was positioned up under his back as he was in the supine position.  Therapist helped reposition the arm and provided demonstrational cueing to pt/family on positioning of the RUE as to avoid injury.  Pt unable to perform any active movement to verbal command at the arm, wrist, or fingers of the RUE.  He was also not able to detect hard pinch in the right foot or the right hand.  Pt provided with max demonstrational cueing and max assistance to transition to sitting on the right side of the bed.  Once in sitting pt became restless and attempted to extend backwards to sit down.  Pt tending to mumble "I can't man."  Therapist provided total assist for sitting balance and then max assist to transfer to the wheelchair.  Pt restless in wheelchair but therapist did push him around the unit to show him the dayroom and the therapy gym.  Pt still restless and therapist decided to place him back in bed at end of session.  Pt's RUE positioned on pillow with bedrails up and bed alarm turned on.   Therapy Documentation Precautions:  Precautions Precautions: Fall Precaution Comments: Head wound Restrictions Weight Bearing Restrictions: No General: General Chart Reviewed: Yes OT Amount of Missed Time: 15 Minutes Family/Caregiver Present: No  Pain: Pain  Assessment Faces Pain Scale: Hurts a little bit ADL: See FIM for current functional status  Therapy/Group: Individual Therapy  Deshante Cassell OTR/L 11/24/2013, 12:37 PM

## 2013-11-24 NOTE — Progress Notes (Signed)
Patient has only slept approximately 1 hr this shift.  He has constantly yelled out.  Tramadol 100 mg offered.  He would only take 50 mg.  Refused second pill.  States,  "I don't want it.  I will be all right."  Patient has also received Trazodone.
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
## 2013-11-24 NOTE — Progress Notes (Signed)
Walked in to check on patient, saw him lying in bed.  The family had put him back in bed without assistance from staff.  Educated family on safety hazard, they agreed not to do it again.  Dani Gobbleeardon, Khyleigh Furney J, RN
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
## 2013-11-25 ENCOUNTER — Encounter (HOSPITAL_COMMUNITY): Payer: Self-pay | Admitting: Occupational Therapy

## 2013-11-25 ENCOUNTER — Inpatient Hospital Stay (HOSPITAL_COMMUNITY): Payer: Medicaid Other | Admitting: Physical Therapy

## 2013-11-25 ENCOUNTER — Inpatient Hospital Stay (HOSPITAL_COMMUNITY): Payer: Medicaid Other | Admitting: Speech Pathology

## 2013-11-25 DIAGNOSIS — S0190XD Unspecified open wound of unspecified part of head, subsequent encounter: Secondary | ICD-10-CM

## 2013-11-25 DIAGNOSIS — R600 Localized edema: Secondary | ICD-10-CM

## 2013-11-25 DIAGNOSIS — D62 Acute posthemorrhagic anemia: Secondary | ICD-10-CM

## 2013-11-25 DIAGNOSIS — S81001S Unspecified open wound, right knee, sequela: Secondary | ICD-10-CM

## 2013-11-25 DIAGNOSIS — W3400XS Accidental discharge from unspecified firearms or gun, sequela: Secondary | ICD-10-CM

## 2013-11-25 DIAGNOSIS — W3400XD Accidental discharge from unspecified firearms or gun, subsequent encounter: Secondary | ICD-10-CM
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
## 2013-11-25 NOTE — Progress Notes (Signed)
Patient moaning,holding head, provieded tylenol 650 mg for discomfort.  Bruce [name]
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
## 2013-11-25 NOTE — Progress Notes (Signed)
*  PRELIMINARY RESULTS* Vascular Ultrasound Lower extremity venous duplex has been completed.  Preliminary findings: no evidence of DVT  Farrel DemarkJill Eunice, RDMS, RVT  11/25/2013, 3:44 PM
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
## 2013-11-25 NOTE — Progress Notes (Addendum)
Patient screaming in pain every time he  Lets out urine. Took out foley . 1pm . Patient voiding but very sore. md notified  Of pain and this nurse taking out foley.  No order to replace. Continue order for prn scans and i and o caths

## 2013-11-25 NOTE — Progress Notes (Addendum)
Patient appears to be restless in bed, grabbing at left thigh, moaning at times, repositioned the patient and catheter, attempted provide PRN ultram, patient fell back to sleep, did not wake up, did not give PRN ultram at this time.  Will continue to monitor.  Kailan Carmen

## 2013-11-25 NOTE — Progress Notes (Signed)
Occupational Therapy Session Note  Patient Details  Name: Bruce [name] XXXSmith MRN: [id number] Date of Birth: Jul 22, 1980  Today's Date: 11/25/2013 OT Individual Time: [id number] OT Individual Time Calculation (min): 60 min    Short Term Goals: Week 1:     Skilled Therapeutic Interventions/Progress Updates:  Upon entering the room pt supine in bed and when asked how he felt stated, "I'[name] okay I guess." Pt required Mod A for supine<> sit on EOB. Pt began to yell, "something is not right." He was unable to tell therapist or point to what was hurting. Pt agreed to transfer into wheelchair for bathing and dressing. He tolerated sitting in w/c for ~ 25 minutes but began lifting L leg up in air and yelling in pain. He also pushed self towards front of wheelchair causing serious concern for fall risk. Pt unable to attend to A & D task at sink side secondary to pain. Once returning to bed, pt able to attend to task with multiple verbal cues to explain sequence and activities. Pt able to wash 5/10 parts while supine in bed. He continued to cry out and point to leg as stating that it was hurting. RN notified and medications given. Pt oriented to self and location but not time or situation this session. Pt supine in bed with bed rails up, call bell, and alarm on upon exiting the room.   Therapy Documentation Precautions:  Precautions Precautions: Fall Precaution Comments: Head wound Restrictions Weight Bearing Restrictions: No ADL: ADL ADL Comments: see FIM  See FIM for current functional status  Therapy/Group: Individual Therapy  Lowella Gripittman, Biran Mayberry L 11/25/2013, 12:41 PM

## 2013-11-25 NOTE — Progress Notes (Signed)
Speech Language Pathology Daily Session Note  Patient Details  Name: Bruce [name] MRN: [id number] Date of Birth: [birth year]  Today's Date: 11/25/2013 SLP Individual Time: [id number] SLP Individual Time Calculation (min): 45 min  Short Term Goals: Week 1: SLP Short Term Goal 1 (Week 1): Patient will demonstrate focused attention to a functional task for 60 seconds with Max A multimodal cues.  SLP Short Term Goal 2 (Week 1): Patient will orient to time, place and situation with Mod A multimodal cues.  SLP Short Term Goal 3 (Week 1): Patient will name functional items with Max A multimodal cues. SLP Short Term Goal 4 (Week 1): Patient will perfrom verbal, automatic tasks with Max A multimodal cues.  SLP Short Term Goal 5 (Week 1): Patient will perform oral motor exercises to reduce right side lingual/labial weakness with Max multimodal cues. SLP Short Term Goal 6 (Week 1): Patient will consume D2 consistency and clear pocketed food, given Mod multimodal cues.   Skilled Therapeutic Interventions: Skilled ST intervention provided with focus on cogntiive goals; Pt seen in room for ST treatment. Pt noted with increased alertness and cooperative with all tx tasks. Pt attended to conversation/tasks for 5 mins, no redirections required. Pt noted with difficulty following 1-step directions, 30% with Max multimodal cues. Pt oriented to self and place only, unsure of situation orientation d/t aphasic responses. Pt required Max verbal cues to perform automatic speech tasks. Clinician structured conversation by asking Y/N questions. Pt appears to be aware of expressive errors as he gets increasingly frustrated with his incorrect responses. Pt repeatedly stated, "I'm just tired".   FIM:  Comprehension Comprehension Mode: Auditory Comprehension: 2-Understands basic 25 - 49% of the time/requires cueing 51 - 75% of the time Expression Expression Mode: Verbal Expression: 2-Expresses basic 25 - 49% of the  time/requires cueing 50 - 75% of the time. Uses single words/gestures. Social Interaction Social Interaction: 2-Interacts appropriately 25 - 49% of time - Needs frequent redirection. Problem Solving Problem Solving: 1-Solves basic less than 25% of the time - needs direction nearly all the time or does not effectively solve problems and may need a restraint for safety Memory Memory: 1-Recognizes or recalls less than 25% of the time/requires cueing greater than 75% of the time FIM - Eating Eating Activity: 4: Helper occasionally brings food to mouth;4: Helper checks for pocketed food  Pain Pain Assessment Pain Assessment: No/denies pain  Therapy/Group: Individual Therapy  Rudi Knippenberg, Kara PacerLeah N 11/25/2013, 4:39 PM

## 2013-11-25 NOTE — Progress Notes (Signed)
Physical Therapy Session Note  Patient Details  Name: [name] MRN: [id number] Date of Birth: February 02, 1981  Today's Date: 11/25/2013 PT Individual Time: 1300-1330 PT Individual Time Calculation (min): 30 min   Short Term Goals: Week 1:  PT Short Term Goal 1 (Week 1): Pt will perform supine<>sit with min A with HOB flat using bed rail. PT Short Term Goal 2 (Week 1): Pt will transfer from bed<>chair (to both R and L sides) with min A and min cueing. PT Short Term Goal 3 (Week 1): Pt will peform static standing x30 seconds with mod A of single therapist. PT Short Term Goal 4 (Week 1): Pt will perform w/c mobility x50' with mod A and 50% cueing for attention to R side. PT Short Term Goal 5 (Week 1): Pt will perform gait x30' with +2Total A.  Skilled Therapeutic Interventions/Progress Updates:  Pt was seen bedside in the pm. Pt's foley was removed by nursing. Pt c/o pain with urination. Pt transferred supine to edge of bed with mod A and siderail. Pt tolerated edge of bed about 10 minutes with min to mod A and verbal cues. Pt stood x 2 at edge of bed with min to mod A. Pt tolerated standing about 60 seconds with min to mod A. Pt transferred edge of bed to recliner, transferring to L side with min to mod A and verbal cues. Pt left sitting up in recliner with call bel within reach and quick release belt in place.   Therapy Documentation Precautions:  Precautions Precautions: Fall Precaution Comments: Head wound Restrictions Weight Bearing Restrictions: No General:   Pain: No c/o pain.      See FIM for current functional status  Therapy/Group: Individual Therapy  [name], Kashayla Ungerer G 11/25/2013, 1:57 PM

## 2013-11-26 ENCOUNTER — Inpatient Hospital Stay (HOSPITAL_COMMUNITY): Payer: Self-pay | Admitting: Physical Therapy

## 2013-11-26 DIAGNOSIS — S069X5A Unspecified intracranial injury with loss of consciousness greater than 24 hours with return to pre-existing conscious level, initial encounter: Secondary | ICD-10-CM

## 2013-11-26 DIAGNOSIS — S0190XS Unspecified open wound of unspecified part of head, sequela: Secondary | ICD-10-CM

## 2013-11-26 MED ORDER — LORAZEPAM 1 MG PO TABS
1.0000 mg | ORAL_TABLET | Freq: Four times a day (QID) | ORAL | Status: DC | PRN
  Administered 2013-11-26 – 2013-11-29 (×2): 1 mg via ORAL
  Filled 2013-11-26 (×2): qty 1
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
## 2013-11-26 NOTE — Progress Notes (Signed)
Physical Therapy Session Note  Patient Details  Name: [name] MRN: [id number] Date of Birth: 1980/04/22  Today's Date: 11/26/2013 PT Individual Time: 0800-0900 PT Individual Time Calculation (min): 60 min   Short Term Goals: Week 1:  PT Short Term Goal 1 (Week 1): Pt will perform supine<>sit with min A with HOB flat using bed rail. PT Short Term Goal 2 (Week 1): Pt will transfer from bed<>chair (to both R and L sides) with min A and min cueing. PT Short Term Goal 3 (Week 1): Pt will peform static standing x30 seconds with mod A of single therapist. PT Short Term Goal 4 (Week 1): Pt will perform w/c mobility x50' with mod A and 50% cueing for attention to R side. PT Short Term Goal 5 (Week 1): Pt will perform gait x30' with +2Total A.  Skilled Therapeutic Interventions/Progress Updates:  Pt was seen bedside in the am. Pt agitated requiring frequent redirection. Pt also easy frustrated throughout treatment due to word finding difficulties. Pt utilized profanity multiple times during treatment due to frustration with inability to express himself as he would like. Pt redirected and educated on inappropriateness of profanity. Pt verbalized understanding and apologized. Pt again verbalizing this am, something just not right but unable to give anymore detail. BP in supine 115/87, HR 69 and O2 sat on room air 100%. Pt willing to participate with therapy. Pt transferred supine to edge of bed with mod A and verbal cues. Pt tolerated edge of bed about 5 minutes with min to mod A, pt tends to lean R and posteriorly. Pt requires verbal and tactile cues to correct. Pt transferred edge of bed to w/c with mod A to L side. BP sitting up in w/c was 118/87, HR 100, O2 sat 100% on room air. Pt propelled w/c with L UE and LE about 25 feet with max A. Pt transferred sit to stand in gym with mod A and verbal cues. Standing tolerance abut 60 seconds, while standing focused on NMR and weight shifting. Pt ambulated  in hallway with L rail and max A, 25 feet x 2. Pt required physical assist to advance to R LE as well as verbal and tactile cues for sequencing. Pt returned to room. Pt transferred w/c to edge of bed with mod A. Pt transferred edge of bed to supine with mod A. Pt left with call bell in place and bed alarm activated.   Therapy Documentation Precautions:  Precautions Precautions: Fall Precaution Comments: Head wound Restrictions Weight Bearing Restrictions: No General:   Pain: No c/o pain.    Locomotion : Ambulation Ambulation/Gait Assistance: 2: Max assist   See FIM for current functional status  Therapy/Group: Individual Therapy  [name], Larayah Clute G 11/26/2013, 12:29 PM

## 2013-11-27 ENCOUNTER — Inpatient Hospital Stay (HOSPITAL_COMMUNITY): Payer: Medicaid Other | Admitting: *Deleted

## 2013-11-27 ENCOUNTER — Inpatient Hospital Stay (HOSPITAL_COMMUNITY): Payer: Self-pay

## 2013-11-27 ENCOUNTER — Inpatient Hospital Stay (HOSPITAL_COMMUNITY): Payer: Medicaid Other

## 2013-11-27 ENCOUNTER — Encounter (HOSPITAL_COMMUNITY): Payer: Self-pay

## 2013-11-27 DIAGNOSIS — S069X4S Unspecified intracranial injury with loss of consciousness of 6 hours to 24 hours, sequela: Secondary | ICD-10-CM

## 2013-11-27 MED ORDER — PROPRANOLOL HCL 10 MG PO TABS
10.0000 mg | ORAL_TABLET | Freq: Three times a day (TID) | ORAL | Status: DC
  Administered 2013-11-27 – 2013-12-19 (×66): 10 mg via ORAL
  Filled 2013-11-27 (×71): qty 1

## 2013-11-27 NOTE — Progress Notes (Addendum)
Speech Language Pathology Daily Session Note  Patient Details  Name: [name] MRN: [id number] Date of Birth: 1981/01/23  Today's Date: 11/27/2013 SLP Individual Time: 0930-1030 SLP Individual Time Calculation (min): 60 min  Short Term Goals: Week 1: SLP Short Term Goal 1 (Week 1): Patient will demonstrate focused attention to a functional task for 60 seconds with Max A multimodal cues.  SLP Short Term Goal 2 (Week 1): Patient will orient to time, place and situation with Mod A multimodal cues.  SLP Short Term Goal 3 (Week 1): Patient will name functional items with Max A multimodal cues. SLP Short Term Goal 4 (Week 1): Patient will perfrom verbal, automatic tasks with Max A multimodal cues.  SLP Short Term Goal 5 (Week 1): Patient will perform oral motor exercises to reduce right side lingual/labial weakness with Max multimodal cues. SLP Short Term Goal 6 (Week 1): Patient will consume D2 consistency and clear pocketed food, given Mod multimodal cues.   Skilled Therapeutic Interventions: Skilled treatment focused on cognitive-linguistic and swallowing goals. SLP facilitated session with skilled observation of breakfast meal, consisting of Dys 2 textures and thin liquids. Pt communicated his preferences throughout the meal primarily with yes/no questions. SLP provided Min cues for utilization of safe swallowing strategies. Intermittent throat clearing noted, however question due to a possible esophageal component given frequent eructation as well. Pt receptively identified items on his tray with Min-Mod cues, and identified family members in pictures with Min cues. Pt appeared to demonstrate increased emergent awareness of language deficits, by making intermittently clear comments such as "can't find my words", "am I getting worse?", and "can they understand me?" Pt attempted counting and reached 1-8 with phonemic paraphasias noted despite Max multimodal cueing. Continue plan of care.    FIM:  Comprehension Comprehension Mode: Auditory Comprehension: 3-Understands basic 50 - 74% of the time/requires cueing 25 - 50%  of the time Expression Expression Mode: Verbal Expression: 2-Expresses basic 25 - 49% of the time/requires cueing 50 - 75% of the time. Uses single words/gestures. Social Interaction Social Interaction: 2-Interacts appropriately 25 - 49% of time - Needs frequent redirection. Problem Solving Problem Solving: 2-Solves basic 25 - 49% of the time - needs direction more than half the time to initiate, plan or complete simple activities Memory Memory: 1-Recognizes or recalls less than 25% of the time/requires cueing greater than 75% of the time FIM - Eating Eating Activity: 4: Helper checks for pocketed food;5: Needs verbal cues/supervision  Pain  No/denies pain  Therapy/Group: Individual Therapy   Maxcine HamLaura Paiewonsky, M.A. CCC-SLP [phone]  Maxcine Hamaiewonsky, Khair Chasteen 11/27/2013, 4:35 PM

## 2013-11-27 NOTE — Progress Notes (Signed)
Note reviewed and this therapist agrees with the information provided.  
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
## 2013-11-27 NOTE — Progress Notes (Signed)
Occupational Therapy Session Note  Patient Details  Name: Bruce [name] XXXSmith MRN: [id number] Date of Birth: [birth year]  Today's Date: 11/27/2013 OT Individual Time: 1:00-2:00 OT Calculated Individual Time (min): 60 min   Short Term Goals: Week 1:     Skilled Therapeutic Interventions/Progress Updates:  Pt seated in w/c w/ sister in room when arriving, reporting pain in RLE and RN made aware. Pt brought to ortho gym and t/f to mat towards L side w/ min A. Pt participated in reaching activity to work on light weightbearing on RUE, word finding (identifying colors), dynamic sitting balance, weight shifting, and attention. Pt requiring max v/c's for identifying colors and saying correctly. Pt completed 4 sit<>stand t/f w/ min-mod A, weight-shifting to L and R side while standing. Pt became frustrated w/ word finding problems during activities, requiring v/c's for redirection to task and reassurance. Pt t/f to w/c towards R side w/ mod A. Pt brought back to room, seated in w/c w/ R lap tray for support of RUE, and all needs nearby when leaving.  Therapy Documentation Precautions:  Precautions Precautions: Fall Precaution Comments: R hemi, expressive aphasia Restrictions Weight Bearing Restrictions: No  ADL: ADL ADL Comments: see FIM  See FIM for current functional status  Therapy/Group: Individual Therapy  Devontaye Ground Raynell 11/27/2013, 2:37 PM

## 2013-11-27 NOTE — Progress Notes (Signed)
Physical Therapy Session Note  Patient Details  Name: Bruce [name] MRN: [id number] Date of Birth: [birth year]  Today's Date: 11/27/2013 PT Individual Time: 1115-1200 and 1400-1445 PT Individual Time Calculation (min): 45 min and 45 min  Short Term Goals: Week 1:  PT Short Term Goal 1 (Week 1): Pt will perform supine<>sit with min A with HOB flat using bed rail. PT Short Term Goal 2 (Week 1): Pt will transfer from bed<>chair (to both R and L sides) with min A and min cueing. PT Short Term Goal 3 (Week 1): Pt will peform static standing x30 seconds with mod A of single therapist. PT Short Term Goal 4 (Week 1): Pt will perform w/c mobility x50' with mod A and 50% cueing for attention to R side. PT Short Term Goal 5 (Week 1): Pt will perform gait x30' with +2Total A.  Skilled Therapeutic Interventions/Progress Updates:    AM Session: Patient received sitting in wheelchair. Session focused on sustained attention with functional transfers, gait training, stair negotiation, and wheelchair mobility. Patient performs squat pivot transfers at maxA level, max cues for proper hand placement and sequencing. Noted motor perseverations of L LE. R DF ace wrap donned for gait training and stair negotiation. Gait training 18'x1 with L handrail and maxA (+2 for wheelchair follow); stair negotiation x3 stairs with L handrail and maxA step to pattern and mod cues for sequencing. Patient requires manual facilitation for upright posture, midline orientation, and advancement/placement/stabilization of R LE. Patient requires significant facilitation for weight shift to the R and demonstrates decreased stance time on R, decreased step length bilaterally, and posterior trunk rotation to the L. Wheelchair mobility x20' with hemi technique and maxA, would benefit from donning shoes or removing cushion in wheelchair during wheelchair mobility to assist with L LE use. Patient returned to room and left sitting in wheelchair  with seatbelt donned and all needs within reach. Propped R UE on pillow, will add half lap tray during PM session.  PM Session: Patient received sitting in wheelchair. Session focused on administration of Trunk Impairment Scale (TIS) and core/trunk NMR.   TIS score = 8/23; score <20 indicates abnormal trunk function  Patient requires modA for anterior scooting from wheelchair onto kaye bench, positioned straddling kaye bench and emphasis on anterior weight shift onto elbows with prolonged hold and anterior/posterior weight shifts to improve pelvic mobility and trunk control. Patient left sitting in wheelchair with seatbelt donned, R half lap try donned and all needs within reach, sister present.  Therapy Documentation Precautions:  Precautions Precautions: Fall Precaution Comments: R hemi, expressive aphasia Restrictions Weight Bearing Restrictions: No Pain: Pain Assessment Pain Assessment: No/denies pain Pain Score: 0-No pain Faces Pain Scale: Hurts even more Pain Intervention(s): RN made aware;Repositioned Locomotion : Ambulation Ambulation/Gait Assistance: 1: +2 Total assist   See FIM for current functional status  Therapy/Group: Individual Therapy  Chipper HerbBridget S Bethanne Mule S. Kili Gracy, PT, DPT 11/27/2013, 12:23 PM

## 2013-11-28 ENCOUNTER — Inpatient Hospital Stay (HOSPITAL_COMMUNITY): Payer: Medicaid Other | Admitting: *Deleted

## 2013-11-28 ENCOUNTER — Inpatient Hospital Stay (HOSPITAL_COMMUNITY): Payer: Medicaid Other

## 2013-11-28 ENCOUNTER — Inpatient Hospital Stay (HOSPITAL_COMMUNITY): Payer: Self-pay

## 2013-11-28 DIAGNOSIS — S069X5S Unspecified intracranial injury with loss of consciousness greater than 24 hours with return to pre-existing conscious level, sequela: Secondary | ICD-10-CM

## 2013-11-28 NOTE — Progress Notes (Addendum)
Occupational Therapy Session Note  Patient Details  Name: Bruce [name] MRN: [id number] Date of Birth: 1980-09-29  Today's Date: 11/28/2013 OT Individual Time: 1100-1200 OT Individual Time Calculation (min): 60 min    Short Term Goals: Week 1:  OT Short Term Goal 1 (Week 1): Pt will complete toilet transfer with min assist and min cues OT Short Term Goal 2 (Week 1): Pt will wash RUE and RLE with mod cues OT Short Term Goal 3 (Week 1): Pt will verbally identify 1 self-care item with max cues OT Short Term Goal 4 (Week 1): Pt will stand with min assist during self-care task for 30 seconds  Skilled Therapeutic Interventions/Progress Updates:    Pt seen for ADL retraining with focus on attention to R, functional transfers, sit<>stand, and communication. Pt received sitting in w/c agreeable to bathing at sink. Pt required max multimodal cues for sequencing and washing RUE. Pt initiated washing upper RLE. Completed sit<>stand during self-care tasks with mod assist while blocking R knees. Pt assisted with washing buttocks and managing clothing around waist with mod cues. Educated on hemi dressing and crossover technique with mod cues for carryover from undergarments to pants. Required total assist with max cues for verbalizing self-care items. Practiced toilet transfer with mod assist stand pivot and use of grab bars. Pt required increased time for transfer to right. Engaged in simple pipe tree in standing with pipes placed on right side. Pt required max cues for attention to right initially, then progressed to min cues. Pt demonstrated sustained attention to task for 3 minutes with mod cues. Pt returned to room and left sitting in w/c with all needs in reach.   Therapy Documentation Precautions:  Precautions Precautions: Fall Precaution Comments: R hemi, expressive aphasia Restrictions Weight Bearing Restrictions: No General:   Vital Signs:   Pain: Pain Assessment Pain Assessment:  Faces Faces Pain Scale: Hurts even more (headache) Pain Type: Acute pain Pain Location: Leg Pain Orientation: Left Pain Descriptors / Indicators: Aching Pain Intervention(s): Medication (See eMAR)  See FIM for current functional status  Therapy/Group: Individual Therapy  Daneil Danerkinson, Lucyle Alumbaugh N 11/28/2013, 12:16 PM

## 2013-11-28 NOTE — Progress Notes (Signed)
Physical Therapy Session Note  Patient Details  Name: Bruce [name] MRN: [id number] Date of Birth: Jul 04, 1980  Today's Date: 11/28/2013 PT Individual Time: 1000-1100 and 1530-1630 PT Individual Time Calculation (min): 60 min and 60 min  Short Term Goals: Week 1:  PT Short Term Goal 1 (Week 1): Pt will perform supine<>sit with min A with HOB flat using bed rail. PT Short Term Goal 2 (Week 1): Pt will transfer from bed<>chair (to both R and L sides) with min A and min cueing. PT Short Term Goal 3 (Week 1): Pt will peform static standing x30 seconds with mod A of single therapist. PT Short Term Goal 4 (Week 1): Pt will perform w/c mobility x50' with mod A and 50% cueing for attention to R side. PT Short Term Goal 5 (Week 1): Pt will perform gait x30' with +2Total A.  Skilled Therapeutic Interventions/Progress Updates:    AM Session: Patient received asleep in bed. Session focused on bed mobility, functional transfers, and Stroke Rehabilitation Assessment of Movement (STREAM). See details below for STREAM.  Patient performing at maxA level for supine<>sit and bed>wheelchair<>mat. Patient able to perform scooting along mat with minA overall for balance. Patient returned to room and left seated in wheelchair with seatbelt donned and R half lap tray in place, all needs within reach.  STREAM: Patient scored 5/100; scores <63 indicate patient has 0% likelihood of d/c home.  PM Session: Patient received asleep in bed. Session focused on functional transfers and gait training. Patient performs supine>sit with HOB flat with maxA, bed>wheelchair via squat pivot and maxA. R DF ace wrap donned for gait training. Gait training 15' x2 with L handrail and maxA (+2 for w/c follow). Gait training 1225' x1 with +2 (L HHA for UE support). Patient requires manual facilitation for upright posture, midline orientation, lateral weight shift to the R, advancement/placement/stabilization of R LE. Verbal cues for looking  up and not at the floor. Noted increased tone in R hamstrings creates difficulty maintaining contact with floor during stance phase without significant lateral weight shift to the R.  Comparison of tone/tightness of hamstrings bilaterally. Noted significant tightness with patient c/o pain on L side as well as R side. Prolonged hamstring/gastroc/soleus stretching with alternating LE rested on foot stool to stretch. Patient returned to room and encouraged to remain sitting in wheelchair for dinner, patient agreeable. Patient left seated in wheelchair with seatbelt donned and R half lap tray in place, all needs within reach.  Therapy Documentation Precautions:  Precautions Precautions: Fall Precaution Comments: R hemi, expressive aphasia Restrictions Weight Bearing Restrictions: No Pain: Pain Assessment Pain Assessment: Faces Faces Pain Scale: Hurts even more (headache) Locomotion : Ambulation Ambulation/Gait Assistance: 1: +2 Total assist (w/c follow)   See FIM for current functional status  Therapy/Group: Individual Therapy  Chipper HerbBridget S Kail Fraley S. Ryker Pherigo, PT, DPT 11/28/2013, 4:38 PM
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
## 2013-11-28 NOTE — Progress Notes (Signed)
Speech Language Pathology Daily Session Note  Patient Details  Name: Bruce [name] MRN: [id number] Date of Birth: 25-Nov-1980  Today's Date: 11/28/2013 SLP Individual Time: [id number] SLP Individual Time Calculation (min): 52 min  Short Term Goals: Week 1: SLP Short Term Goal 1 (Week 1): Patient will demonstrate focused attention to a functional task for 60 seconds with Max A multimodal cues.  SLP Short Term Goal 2 (Week 1): Patient will orient to time, place and situation with Mod A multimodal cues.  SLP Short Term Goal 3 (Week 1): Patient will name functional items with Max A multimodal cues. SLP Short Term Goal 4 (Week 1): Patient will perfrom verbal, automatic tasks with Max A multimodal cues.  SLP Short Term Goal 5 (Week 1): Patient will perform oral motor exercises to reduce right side lingual/labial weakness with Max multimodal cues. SLP Short Term Goal 6 (Week 1): Patient will consume D2 consistency and clear pocketed food, given Mod multimodal cues.   Skilled Therapeutic Interventions: Skilled treatment focused on cognitive-linguistic goals. SLP facilitated session with Mod A for patient to communicate basic wants/needs throughout session utilizing multimodal communication methods. Given three written choices, pt demonstrated orientation to location ("hospital") and situation (hurt his "head"). Pt required frequent redirection every few seconds due to reports of pain in his head, which RN had just given him medication for. WIth visual aids (written sign and watch) to faciltiate tracking time, cueing was faded from Max to Mod level. Pt sorted cards into two and four categories with no additional cues for accuracy, self-correcting minimal errors. Pt was returned to his room and assisted back to bed with NT, who informed RN that pain was not yet relieved. Pt missed 8 minutes of therapy due to pain.   FIM:  Comprehension Comprehension Mode: Auditory Comprehension: 4-Understands basic 75  - 89% of the time/requires cueing 10 - 24% of the time Expression Expression Mode: Verbal Expression: 2-Expresses basic 25 - 49% of the time/requires cueing 50 - 75% of the time. Uses single words/gestures. Social Interaction Social Interaction: 2-Interacts appropriately 25 - 49% of time - Needs frequent redirection. Problem Solving Problem Solving: 3-Solves basic 50 - 74% of the time/requires cueing 25 - 49% of the time Memory Memory: 2-Recognizes or recalls 25 - 49% of the time/requires cueing 51 - 75% of the time  Pain Pain Assessment Pain Assessment: Faces Faces Pain Scale: Hurts even more (headache) Pain Type: Acute pain Pain Location: Leg Pain Orientation: Left Pain Descriptors / Indicators: Aching Pain Intervention(s): Medication (See eMAR)  Therapy/Group: Individual Therapy   Bruce [name], Bruce [name] [phone]  Bruce Hamaiewonsky, [name] 11/28/2013, 12:15 PM

## 2013-11-28 NOTE — Progress Notes (Signed)
Occupational Therapy Note  Patient Details  Name: Bruce [name] XXXSmith MRN: [id number] Date of Birth: 1980/04/03  Today's Date: 11/28/2013 OT Individual Time: 1330-1400 OT Individual Time Calculation (min): 30 min   Pt denied pain Individual Therapy  Pt asleep in w/c upon arrival and required mod multimodal cues to arouse.  Pt agreeable to therapy and engaged in card games with focus on task initiation, cognitive remediation (memory), turn taking, attention to task, and orientation.   Lavone NeriLanier, Andriy Sherk [facility] HospitalChappell 11/28/2013, 2:27 PM
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
## 2013-11-29 ENCOUNTER — Inpatient Hospital Stay (HOSPITAL_COMMUNITY): Payer: Self-pay | Admitting: Occupational Therapy

## 2013-11-29 ENCOUNTER — Inpatient Hospital Stay (HOSPITAL_COMMUNITY): Payer: Self-pay

## 2013-11-29 ENCOUNTER — Inpatient Hospital Stay (HOSPITAL_COMMUNITY): Payer: Medicaid Other | Admitting: Speech Pathology

## 2013-11-29 ENCOUNTER — Encounter (HOSPITAL_COMMUNITY): Payer: Self-pay

## 2013-11-29 MED ORDER — LORAZEPAM 0.5 MG PO TABS
0.5000 mg | ORAL_TABLET | Freq: Four times a day (QID) | ORAL | Status: DC | PRN
  Administered 2013-12-01 – 2013-12-03 (×2): 0.5 mg via ORAL
  Filled 2013-11-29 (×3): qty 1

## 2013-11-29 MED ORDER — COLLAGENASE 250 UNIT/GM EX OINT
TOPICAL_OINTMENT | Freq: Every day | CUTANEOUS | Status: DC
Start: 2013-11-29 — End: 2013-12-13
  Administered 2013-11-29 – 2013-12-12 (×11): via TOPICAL
  Filled 2013-11-29: qty 30

## 2013-11-29 NOTE — Progress Notes (Signed)
Physical Therapy Note  Patient Details  Name: [name] MRN: [id number] Date of Birth: [birth year] Today's Date: 11/29/2013    Pt received in bed asleep. Pt unable to be aroused to participate in therapy despite max multimodal cueing. Pt noted to be extremely lethargic compared to PT session 1 hr ago. RN made aware of pt's decreased arousal, RN present to assess vital signs. Will follow up this PM. Pt missed 30 minutes scheduled PT time.   Hosie SpangleGodfrey, Naithan Delage 11/29/2013, 12:10 PM
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
## 2013-11-29 NOTE — Progress Notes (Signed)
Speech Language Pathology Daily Session Note  Patient Details  Name: Bruce [name] MRN: [id number] Date of Birth: [birth year]  Today's Date: 11/29/2013 SLP Individual Time: [id number] SLP Individual Time Calculation (min): 60 min  Short Term Goals: Week 1: SLP Short Term Goal 1 (Week 1): Patient will demonstrate focused attention to a functional task for 60 seconds with Max A multimodal cues.  SLP Short Term Goal 2 (Week 1): Patient will orient to time, place and situation with Mod A multimodal cues.  SLP Short Term Goal 3 (Week 1): Patient will name functional items with Max A multimodal cues. SLP Short Term Goal 4 (Week 1): Patient will perfrom verbal, automatic tasks with Max A multimodal cues.  SLP Short Term Goal 5 (Week 1): Patient will perform oral motor exercises to reduce right side lingual/labial weakness with Max multimodal cues. SLP Short Term Goal 6 (Week 1): Patient will consume D2 consistency and clear pocketed food, given Mod multimodal cues.   Skilled Therapeutic Interventions: Skilled treatment focused on cognitive-linguistic and speech goals. Student facilitated session by providing Max-Total A multimodal cues for writing name and other sight words. Student also facilitated session by providing Mod-Max A multimodal cues during a word level reading comprehension task but ended task due to patient report of headache, suspect due to visual impairments. RN made aware and patient received medication. Student also facilitated session by providing Max-Total A multimodal cues during a basic naming task, however, semantic, phonemic, and repetition were not effective cueing techniques. Patient demonstrated verbal and written perseveration and reported frustration with current mental and physical functioning, requiring Mod A multimodal cues for redirection intermittently throughout the session. Continue with current plan of care.    FIM:  Comprehension Comprehension Mode:  Auditory Comprehension: 3-Understands basic 50 - 74% of the time/requires cueing 25 - 50%  of the time Expression Expression Mode: Verbal Expression: 1-Expresses basis less than 25% of the time/requires cueing greater than 75% of the time. Social Interaction Social Interaction: 2-Interacts appropriately 25 - 49% of time - Needs frequent redirection. Problem Solving Problem Solving: 2-Solves basic 25 - 49% of the time - needs direction more than half the time to initiate, plan or complete simple activities Memory Memory: 2-Recognizes or recalls 25 - 49% of the time/requires cueing 51 - 75% of the time  Pain Pain Assessment Pain Assessment: 0-10 Pain Score:  (unable to rate) Faces Pain Scale: Hurts even more Pain Type: Acute pain Pain Location: Head Pain Orientation: Left Pain Descriptors / Indicators: Aching Pain Onset: Gradual Pain Intervention(s): Medication (See eMAR);RN made aware;Environmental changes Multiple Pain Sites: No  Therapy/Group: Individual Therapy  Bruce [name] 11/29/2013, 4:11 PM

## 2013-11-29 NOTE — Plan of Care (Signed)
Problem: RH BOWEL ELIMINATION Goal: RH STG MANAGE BOWEL WITH ASSISTANCE STG Manage Bowel with Mod Assistance.  Outcome: Not Progressing Pt incontinent of bowels

## 2013-11-29 NOTE — Progress Notes (Signed)
Occupational Therapy Session Note  Patient Details  Name: [name] MRN: [id number] Date of Birth: [birth year]  Today's Date: 11/29/2013 OT Individual Time: [id number] OT Individual Time Calculation (min): 15 min    Short Term Goals: Week 1:  OT Short Term Goal 1 (Week 1): Pt will complete toilet transfer with min assist and min cues OT Short Term Goal 2 (Week 1): Pt will wash RUE and RLE with mod cues OT Short Term Goal 3 (Week 1): Pt will verbally identify 1 self-care item with max cues OT Short Term Goal 4 (Week 1): Pt will stand with min assist during self-care task for 30 seconds  Skilled Therapeutic Interventions/Progress Updates:    Attempted to engage pt in therapeutic activity with planned focus on standing balance and attention to task. Upon arrival pt reports "not feeling well" and with further questioning unable to locate pain source.  Attempted to redirect pt and educate on current status and purpose of therapy with pt continuing to report "I'm sorry, I just can't."  Discussed status with primary OT and returned to attempt again with pt again reporting that he is sorry but he can't.  Pt requested to return to bed to rest, stand pivot to Rt min-mod assist and assist to bring both legs into bed.   Therapy Documentation Precautions:  Precautions Precautions: Fall Precaution Comments: R hemi, expressive aphasia Restrictions Weight Bearing Restrictions: No General: General OT Amount of Missed Time: 15 Minutes Vital Signs:   Pain: Pain Assessment Faces Pain Scale: Hurts worst Pain Type: Acute pain Pain Location: Head Pain Descriptors / Indicators: Aching Pain Intervention(s): Medication (See eMAR) ADL: ADL ADL Comments: see FIM  See FIM for current functional status  Therapy/Group: Individual Therapy  Rosalio LoudHOXIE, Anaja Monts 11/29/2013, 10:18 AM

## 2013-11-29 NOTE — Progress Notes (Signed)
Physical Therapy Session Note  Patient Details  Name: Bruce [name] MRN: [id number] Date of Birth: 1980/07/09  Today's Date: 11/29/2013 PT Individual Time: 0900-0930 PT Individual Time Calculation (min): 30 min  Session 2 Time: 1500-1600 Time Calculation (min): 60 min   Short Term Goals: Week 1:  PT Short Term Goal 1 (Week 1): Pt will perform supine<>sit with min A with HOB flat using bed rail. PT Short Term Goal 2 (Week 1): Pt will transfer from bed<>chair (to both R and L sides) with min A and min cueing. PT Short Term Goal 3 (Week 1): Pt will peform static standing x30 seconds with mod A of single therapist. PT Short Term Goal 4 (Week 1): Pt will perform w/c mobility x50' with mod A and 50% cueing for attention to R side. PT Short Term Goal 5 (Week 1): Pt will perform gait x30' with +2Total A.  Skilled Therapeutic Interventions/Progress Updates:    Session 1: Pt received supine in bed finishing breakfast, agreeable to participate in therapy. Performed PROM w/ PNF techniques for RLE NMR in supine. Pt able to speak in full sentences 25% of the time, displayed word finding issues 50-75% of the time. Pt moved supine>sit w/ bed rails and HOB elevated to R side w/ ModA. Tolerated seated EOB for 8 minutes w/ assist ranging from CGA to Mod to maintain seated balance depending on trunk position. At end of session pt reported he was extremely fatigued, asked to lay down, pt specified he wanted HOB flat in order to rest. Pt transferred back to supine w/ ModA, left supine in bed w/ all needs within reach.  Session 2: Pt received seated in w/c w/ sister present, agreeable to participate in therapy. Pt able to identify sister, oriented to place w/ max cueing, unable to name year. Pt transported to rehab gym via w/c. Worked on standing balance and pre-gait activities in parallel bars including weight shifting, forward/backward steps w/ LLE. Pt required Mod-totalA to maintain standing balance. Pt able to  come sit<>stand w/ Min-ModA depending on fatigue and foot placement. Pt ambulated 100' in hospital hallway w/ +2A w/ three musketeers technique, assist to maintain R stance phase and to advance R limb. Pt noted to have activation in R quad/glutes, improved reciprocal motor pattern vs yesterday. Pt transported back to room and left seated in w/c w/ sister present and all needs within reach.   Therapy Documentation Precautions:  Precautions Precautions: Fall Precaution Comments: R hemi, expressive aphasia Restrictions Weight Bearing Restrictions: No General:   Vital Signs: Therapy Vitals Temp: 98.6 F (37 C) Temp Source: Oral Pulse Rate: 70 Resp: 18 BP: 112/74 mmHg Patient Position (if appropriate): Lying Oxygen Therapy SpO2: 99 % O2 Device: None (Room air) Pain: Pain Assessment Pain Assessment: Faces Pain Score:  (unable to rate) Faces Pain Scale: Hurts little more Pain Type: Acute pain Pain Location: Leg Pain Orientation: Right Pain Descriptors / Indicators: Aching Pain Onset: With Activity Pain Intervention(s):  (Pt pre-medicated prior to session) Multiple Pain Sites: No Locomotion : Ambulation Ambulation/Gait Assistance: 1: +2 Total assist    See FIM for current functional status  Therapy/Group: Individual Therapy  Bruce [name], Bruce [name]  Bruce [name], PT, DPT 11/29/2013, 7:42 AM

## 2013-11-29 NOTE — Progress Notes (Signed)
Occupational Therapy Session Note  Patient Details  Name: Bruce [name] MRN: [id number] Date of Birth: May 05, 1980  Today's Date: 11/29/2013 OT Individual Time: [id number] and [id number] OT Individual Time Calculation (min): 10 min and 25 min and Today's Date: 11/29/2013 OT Missed Time: 25 Minutes Missed Time Reason: Patient fatigue   Short Term Goals: Week 1:  OT Short Term Goal 1 (Week 1): Pt will complete toilet transfer with min assist and min cues OT Short Term Goal 2 (Week 1): Pt will wash RUE and RLE with mod cues OT Short Term Goal 3 (Week 1): Pt will verbally identify 1 self-care item with max cues OT Short Term Goal 4 (Week 1): Pt will stand with min assist during self-care task for 30 seconds  Skilled Therapeutic Interventions/Progress Updates:    Session 1: Pt received supine in bed asleep. Pt easily aroused, however only keeping eyes opened 2-3 seconds. Pt shaking head no to therapy and holding head to indicate pain. Provided PROM and PNF pattern to RUE with no signs of discomfort and pt opening eyes to therapist's voice, however no engagement in conversation. Pt falling asleep and snoring. Pt left supine in bed. Notified RN of status and will follow-up as able.  Session 2: Pt seen for make-up from missed OT minutes d/t fatigue. Pt received supine in bed asleep however easily aroused with encouragement to transfer to w/c for lunch. Pt required max assist supine>sit and stand pivot transfer bed>w/c. Provided wash cloth to increase arousal and pt washed face then initiated washing chest. Completed UB bathing with mod cues for washing RUE. Pt agreeable to get dressed as he was attempting to remove gown. Completed sit<>stand with max assist as pt managed clothing up. Pt initiated brushing teeth using gestures. Pt completed task with max cues for applying toothpaste to brush as he attempted to place in mouth. Pt left sitting in w/c with RN present for lunch. Pt still missing 25 min from  scheduled 60 min AM session. Will follow-up as able.   Therapy Documentation Precautions:  Precautions Precautions: Fall Precaution Comments: R hemi, expressive aphasia Restrictions Weight Bearing Restrictions: No General: General OT Amount of Missed Time: 50 Minutes Vital Signs: Therapy Vitals Pulse Rate: 64 BP: 107/67 mmHg Oxygen Therapy SpO2: 99 % O2 Device: None (Room air) Pain: Pt fatigued however holding head with bouts of arousal and nodding head "yes" to indicate pain.  See FIM for current functional status  Therapy/Group: Individual Therapy  Marylynn Rigdon, Vara GuardianKayla N 11/29/2013, 11:15 AM

## 2013-11-29 NOTE — Progress Notes (Signed)
The skilled treatment note has been reviewed and SLP is in agreement. Keyanah Kozicki, M.A., CCC-SLP [phone]  

## 2013-11-29 NOTE — Progress Notes (Signed)
Occupational Therapy Session Note  Patient Details  Name: Bruce [name] MRN: [id number] Date of Birth: 10/10/1980  Today's Date: 11/29/2013 OT Individual Time: [id number] OT Individual Time Calculation (min): 24 min    Short Term Goals: Week 1:  OT Short Term Goal 1 (Week 1): Pt will complete toilet transfer with min assist and min cues OT Short Term Goal 2 (Week 1): Pt will wash RUE and RLE with mod cues OT Short Term Goal 3 (Week 1): Pt will verbally identify 1 self-care item with max cues OT Short Term Goal 4 (Week 1): Pt will stand with min assist during self-care task for 30 seconds  Skilled Therapeutic Interventions/Progress Updates:    Pt needed max instructional cueing to arouse pt to begin session.  He was unable to state place when asked but did state his sister's name when the therapist asked.  Pt became agitated sitting EOB and grabbing at his privates.  Therapist asked him if he needed to use the bathroom and he stated "yes" "hurry"  Unfortunately with pt wearing brief he was not able to access the urinal and urinated in his brief.  Pt visually upset stating occasional profanity because of this.  Therapist helped re-direct pt and then provided mod assist to transfer to the wheelchair.  Pt worked on cleaning and donning new brief and shorts at the sink.  He needed mod assist for standing balance while therapist assisted with doffing clothing.  Pt performed his own washing with min instructional cueing.  Pt somewhat perseverating on washing but was easily re-directed.  Mod assist also needed to donn shorts after therapist and nursing assisted with brief.   Therapy Documentation Precautions:  Precautions Precautions: Fall Precaution Comments: R hemi, expressive aphasia Restrictions Weight Bearing Restrictions: No  Pain: Pain Assessment Pain Assessment: Faces Pain Score: 0-No pain Faces Pain Scale: Hurts even more Pain Location: Head Pain Orientation: Left Pain  Descriptors / Indicators: Aching Pain Intervention(s): Medication (See eMAR) ADL: See FIM for current functional status  Therapy/Group: Individual Therapy  Trip Cavanagh OTR/L 11/29/2013, 4:02 PM

## 2013-11-30 ENCOUNTER — Inpatient Hospital Stay (HOSPITAL_COMMUNITY): Payer: Self-pay

## 2013-11-30 ENCOUNTER — Inpatient Hospital Stay (HOSPITAL_COMMUNITY): Payer: Medicaid Other | Admitting: *Deleted

## 2013-11-30 ENCOUNTER — Encounter (HOSPITAL_COMMUNITY): Payer: Self-pay

## 2013-11-30 ENCOUNTER — Inpatient Hospital Stay (HOSPITAL_COMMUNITY): Payer: Medicaid Other | Admitting: Speech Pathology

## 2013-11-30 NOTE — Progress Notes (Signed)
Pt had no incontinent episodes with bladder today. Timed toileting by staff initiated. Pt voided in urinal with pt knowing he had the urge and need to go.  Will continue to pass information along to staff with same POC.

## 2013-11-30 NOTE — Progress Notes (Signed)
Physical Therapy Session Note  Patient Details  Name: [name] MRN: [id number] Date of Birth: January 11, 1981  Today's Date: 11/30/2013 PT Individual Time: 1300-1345 PT Individual Time Calculation (min): 45 min   Short Term Goals: Week 1:  PT Short Term Goal 1 (Week 1): Pt will perform supine<>sit with min A with HOB flat using bed rail. PT Short Term Goal 2 (Week 1): Pt will transfer from bed<>chair (to both R and L sides) with min A and min cueing. PT Short Term Goal 3 (Week 1): Pt will peform static standing x30 seconds with mod A of single therapist. PT Short Term Goal 4 (Week 1): Pt will perform w/c mobility x50' with mod A and 50% cueing for attention to R side. PT Short Term Goal 5 (Week 1): Pt will perform gait x30' with +2Total A.  Skilled Therapeutic Interventions/Progress Updates:    Patient received sitting in wheelchair. Session focused on sustained attention and command following with functional transfers, gait training, and stair negotiation. R DF ace wrap donned for gait training and stair negotiation. Gait training 25'x1 with L handrail and maxA (+2 for wheelchair follow); stair negotiation x3 stairs with L handrail and maxA step to pattern and mod cues for sequencing. Patient requires manual facilitation for upright posture, midline orientation, and advancement/placement/stabilization of R LE. Patient requires significant facilitation for weight shift to the R and demonstrates decreased stance time on R, decreased step length bilaterally, and posterior trunk rotation to the L. Patient returned to room and left sitting in wheelchair with seatbelt donned and all needs within reach.  Therapy Documentation Precautions:  Precautions Precautions: Fall Precaution Comments: R hemi, expressive aphasia Restrictions Weight Bearing Restrictions: No Other Position/Activity Restrictions: no limitations given in chart regarding knee ROM or WB'ing, x-ray negative for bony  abnormality Pain: Pain Assessment Pain Assessment: No/denies pain Pain Score: 0-No pain Locomotion : Ambulation Ambulation/Gait Assistance: 1: +2 Total assist   See FIM for current functional status  Therapy/Group: Individual Therapy  Chipper HerbBridget S Salahuddin Arismendez S. Taralee Marcus, PT, DPT 11/30/2013, 4:57 PM

## 2013-11-30 NOTE — Progress Notes (Signed)
Social Work Patient ID: Bruce [name], male   DOB: 10-21-1980, 33 y.o.   MRN: [id number]  Met with pt's sister yesterday to review team conference. She is pleased with progress so far - able to watch pt up and walking with PT.  Aware that team estimated need for approx 3 week LOS and then to transition to SNF.  She is agreeable with this plan.  No concerns or questions at this time.  Shaleta Ruacho, LCSW
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
## 2013-11-30 NOTE — Progress Notes (Signed)
Occupational Therapy Session Note  Patient Details  Name: Bruce [name] MRN: [id number] Date of Birth: Apr 28, 1980  Today's Date: 11/30/2013 OT Individual Time: [id number] and 1115-1200 and [id number]  OT Individual Time Calculation (min): 60 min and 45 min and 30 min    Short Term Goals: Week 1:  OT Short Term Goal 1 (Week 1): Pt will complete toilet transfer with min assist and min cues OT Short Term Goal 2 (Week 1): Pt will wash RUE and RLE with mod cues OT Short Term Goal 3 (Week 1): Pt will verbally identify 1 self-care item with max cues OT Short Term Goal 4 (Week 1): Pt will stand with min assist during self-care task for 30 seconds  Skilled Therapeutic Interventions/Progress Updates:    Session 1: Pt seen for 1:1 OT session with focus on hemi dressing technique, standing balance, functional transfers, and expression. Pt received supine in bed tearful stating "it doesn't matter." Pt verbalized feeling "depressed" and initially declining therapy. Pt with increased affect when mentioning going outside and pt willing to get OOB. Educated and practiced use of call light as pt requested new socks. Pt then initiated use of urinal via gestures.Completed dressing sitting EOB with good carryover of hemi dressing at pt required min cues for threading RUE first, then pt able to complete. Pt required assist for crossing over RLE then able to thread leg with increase time. Completed oral care in standing at sink while weight bearing through RUE and max-mod assist standing balance. Engaged in card game of War outside, with pt demonstrating increased affect and participation. Pt with 100% accuracy with higher/lower card. Emphasis on counting to identify number of card with pt stating 1,2,3,4 clearly on 2 occasions. Pt returned to room and left in w/c with all needs in reach.    Session 2: Pt seen for ADL retraining with focus on standing balance, functional transfers, safety, and hemi dressing technique.  Pt received sitting in w/c having increased difficulty with expression, however with cues for slowing down and use of gestures pt able to communicate some needs. Completed bathing at shower level with max assist stand pivot transfer w/c<>shower chair and max assist standing balance. Pt required total assist for washing RUE via bringing into midline. Pt demonstrated improved sequencing during bathing as he washed 5/10 body parts before requiring mod cues for completion of task. Completed dressing from w/c with pt donning shirt with setup assist and mod cues. Pt required mod assist for sit<>stand during LB dressing. Pt enjoyed shower this AM as he repeatedly stated "everyday" for request to showering. Pt left sitting in w/c with all needs in reach.   Session 3: Pt seen for 1:1 OT session with focus on functional transfers and RUE NMR. Pt received sitting in w/c. Transferred to mat table with max assist stand pivot. Engaged in towel glides, muscle tapping, and PROM to RUE. Pt demonstrating trace activation with scapular retraction and tricep movements. Pt required min cues for visually attending to RUE throughout activity. Pt returned to room and left sitting in w/c with all needs in reach.   Therapy Documentation Precautions:  Precautions Precautions: Fall Precaution Comments: R hemi, expressive aphasia Restrictions Weight Bearing Restrictions: No General:   Vital Signs: Therapy Vitals Temp: 98.1 F (36.7 C) Temp Source: Oral Pulse Rate: 58 Resp: 18 BP: 107/62 mmHg Patient Position (if appropriate): Lying Oxygen Therapy SpO2: 100 % O2 Device: None (Room air) Pain: Pain Assessment Faces Pain Scale: Hurts a little bit Pain Type:  Acute pain Pain Location: Head Pain Orientation: Left Pain Descriptors / Indicators: Aching Pain Intervention(s): Medication (See eMAR)  See FIM for current functional status  Therapy/Group: Individual Therapy  Daneil Danerkinson, Yuvraj Pfeifer N 11/30/2013, 9:38 AM

## 2013-11-30 NOTE — Progress Notes (Signed)
Speech Language Pathology Daily Session Note  Patient Details  Name: Bruce [name] MRN: [id number] Date of Birth: 1980/10/19  Today's Date: 11/30/2013 SLP Co-Treatment Time: 1530 (1530-1600 co-tx with PT)-1545 SLP Co-Treatment Time Calculation (min): 15 min  Short Term Goals: Week 1: SLP Short Term Goal 1 (Week 1): Patient will demonstrate focused attention to a functional task for 60 seconds with Max A multimodal cues.  SLP Short Term Goal 2 (Week 1): Patient will orient to time, place and situation with Mod A multimodal cues.  SLP Short Term Goal 3 (Week 1): Patient will name functional items with Max A multimodal cues. SLP Short Term Goal 4 (Week 1): Patient will perfrom verbal, automatic tasks with Max A multimodal cues.  SLP Short Term Goal 5 (Week 1): Patient will perform oral motor exercises to reduce right side lingual/labial weakness with Max multimodal cues. SLP Short Term Goal 6 (Week 1): Patient will consume D2 consistency and clear pocketed food, given Mod multimodal cues.   Skilled Therapeutic Interventions: Skilled co-treatment session with PT focused on addressing speech-language goals in conjunction with mobility. SLP facilitated session with Max A written choice of three and to answer biographical questions.  SLP also introduced a new learning task that required him to recall and follow procedures for a card game, which he was able to do with Max faded to Mod cues.  Impaired selective attention to task appeared to be most limiting factor during task completion.  Session ended with patient continueing to work with PT. Continue with current plan of care.   FIM:  Comprehension Comprehension Mode: Auditory Comprehension: 3-Understands basic 50 - 74% of the time/requires cueing 25 - 50%  of the time Expression Expression Mode: Verbal Expression: 2-Expresses basic 25 - 49% of the time/requires cueing 50 - 75% of the time. Uses single words/gestures. Social  Interaction Social Interaction: 3-Interacts appropriately 50 - 74% of the time - May be physically or verbally inappropriate. Problem Solving Problem Solving: 2-Solves basic 25 - 49% of the time - needs direction more than half the time to initiate, plan or complete simple activities Memory Memory: 2-Recognizes or recalls 25 - 49% of the time/requires cueing 51 - 75% of the time  Pain Pain Assessment Pain Assessment: No/denies pain  Therapy/Group: Individual Therapy  Charlane FerrettiMelissa Florene Brill, M.A., CCC-SLP [phone]  Bruce [name] 11/30/2013, 4:33 PM

## 2013-11-30 NOTE — Progress Notes (Signed)
Speech Language Pathology Daily Session Note  Patient Details  Name: Bruce [name] MRN: [id number] Date of Birth: 05-Jun-1980  Today's Date: 11/30/2013 SLP Individual Time: 1500-1530 SLP Individual Time Calculation (min): 30 min  Short Term Goals: Week 1: SLP Short Term Goal 1 (Week 1): Patient will demonstrate focused attention to a functional task for 60 seconds with Max A multimodal cues.  SLP Short Term Goal 2 (Week 1): Patient will orient to time, place and situation with Mod A multimodal cues.  SLP Short Term Goal 3 (Week 1): Patient will name functional items with Max A multimodal cues. SLP Short Term Goal 4 (Week 1): Patient will perfrom verbal, automatic tasks with Max A multimodal cues.  SLP Short Term Goal 5 (Week 1): Patient will perform oral motor exercises to reduce right side lingual/labial weakness with Max multimodal cues. SLP Short Term Goal 6 (Week 1): Patient will consume D2 consistency and clear pocketed food, given Mod multimodal cues.   Skilled Therapeutic Interventions: Skilled treatment session focused on addressing speech-language goals. SLP facilitated session with Max A semantic and phonemic cues increase to models with cues for articulatory placement; patient demonstrated most success with CVC words with frontal placement of articulators.  Patient demonstrated spontaneous verbal expression at the partial phrase level today and verbal perseveration and overall frustration with less today.  Continue with current plan of care.   FIM:  Comprehension Comprehension Mode: Auditory Comprehension: 3-Understands basic 50 - 74% of the time/requires cueing 25 - 50%  of the time Expression Expression Mode: Verbal Expression: 2-Expresses basic 25 - 49% of the time/requires cueing 50 - 75% of the time. Uses single words/gestures. Social Interaction Social Interaction: 3-Interacts appropriately 50 - 74% of the time - May be physically or verbally inappropriate. Problem  Solving Problem Solving: 2-Solves basic 25 - 49% of the time - needs direction more than half the time to initiate, plan or complete simple activities Memory Memory: 2-Recognizes or recalls 25 - 49% of the time/requires cueing 51 - 75% of the time  Pain Pain Assessment Pain Assessment: No/denies pain Faces Pain Scale: Hurts whole lot Pain Type: Acute pain Pain Location: Head Pain Intervention(s): Medication (See eMAR)  Therapy/Group: Individual Therapy  Charlane FerrettiMelissa Carigan Lister, M.A., CCC-SLP [phone]  Bruce [name] 11/30/2013, 4:26 PM

## 2013-11-30 NOTE — Progress Notes (Signed)
Physical Therapy Session Note  Patient Details  Name: Bruce [name] MRN: [id number] Date of Birth: [birth year]  Today's Date: 11/30/2013 PT Individual Time: 1600-1630 PT Individual Time Calculation (min): 30 min  and Today's Date: 11/30/2013 PT Co-Treatment Time: 1545-1600 PT Co-Treatment Time Calculation (min): 15 min with SLP  Short Term Goals: Week 1:  PT Short Term Goal 1 (Week 1): Pt will perform supine<>sit with min A with HOB flat using bed rail. PT Short Term Goal 2 (Week 1): Pt will transfer from bed<>chair (to both R and L sides) with min A and min cueing. PT Short Term Goal 3 (Week 1): Pt will peform static standing x30 seconds with mod A of single therapist. PT Short Term Goal 4 (Week 1): Pt will perform w/c mobility x50' with mod A and 50% cueing for attention to R side. PT Short Term Goal 5 (Week 1): Pt will perform gait x30' with +2Total A.  Skilled Therapeutic Interventions/Progress Updates:    Skilled co-treatment session with SLP focused on addressing speech-language goals in conjunction with functional mobility. SLP facilitated session with Max A written choice of three and to answer biographical questions. SLP also introduced a new learning task that required him to recall and follow procedures for a card game, which he was able to do with Max faded to Mod cues. Impaired selective attention to task appeared to be most limiting factor during task completion.  After SLP finished co-treatment time, continued card game with patient progressing to min cues with increased difficulty of task. Patient returned to room and left seated in wheelchair with seatbelt donned and all needs within reach.  Therapy Documentation Precautions:  Precautions Precautions: Fall Precaution Comments: R hemi, expressive aphasia Restrictions Weight Bearing Restrictions: No Other Position/Activity Restrictions: no limitations given in chart regarding knee ROM or WB'ing, x-ray negative for bony  abnormality Pain: Pain Assessment Pain Assessment: No/denies pain Pain Score: 0-No pain Locomotion : Ambulation Ambulation/Gait Assistance: 1: +2 Total assist   See FIM for current functional status  Therapy/Group: Individual Therapy and Co-Treatment  Chipper HerbBridget S Kalaya Infantino S. Thedore Pickel, PT, DPT 11/30/2013, 5:04 PM

## 2013-12-01 ENCOUNTER — Inpatient Hospital Stay (HOSPITAL_COMMUNITY): Payer: Self-pay

## 2013-12-01 ENCOUNTER — Inpatient Hospital Stay (HOSPITAL_COMMUNITY): Payer: Medicaid Other

## 2013-12-01 ENCOUNTER — Inpatient Hospital Stay (HOSPITAL_COMMUNITY): Payer: Medicaid Other | Admitting: *Deleted

## 2013-12-01 ENCOUNTER — Inpatient Hospital Stay (HOSPITAL_COMMUNITY): Payer: Medicaid Other | Admitting: Speech Pathology

## 2013-12-01 DIAGNOSIS — G8111 Spastic hemiplegia affecting right dominant side: Secondary | ICD-10-CM | POA: Diagnosis present

## 2013-12-01 DIAGNOSIS — G8110 Spastic hemiplegia affecting unspecified side: Secondary | ICD-10-CM

## 2013-12-01 MED ORDER — MUSCLE RUB 10-15 % EX CREA
TOPICAL_CREAM | Freq: Two times a day (BID) | CUTANEOUS | Status: DC | PRN
  Administered 2013-12-01: 11:00:00 via TOPICAL
  Filled 2013-12-01: qty 85

## 2013-12-01 MED ORDER — TROLAMINE SALICYLATE 10 % EX CREA: TOPICAL_CREAM | Freq: Two times a day (BID) | CUTANEOUS | Status: DC | PRN

## 2013-12-01 MED ORDER — METHOCARBAMOL 500 MG PO TABS
500.0000 mg | ORAL_TABLET | Freq: Four times a day (QID) | ORAL | Status: DC | PRN
  Administered 2013-12-01 – 2013-12-15 (×5): 500 mg via ORAL
  Filled 2013-12-01 (×6): qty 1
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
## 2013-12-01 NOTE — Progress Notes (Signed)
Occupational Therapy Weekly Progress Note and Treatment Notes  Patient Details  Name: Bruce [name] MRN: [id number] Date of Birth: 04-19-1980  Beginning of progress report period: November 24, 2013 End of progress report period: December 01, 2013  Today's Date: 12/01/2013 OT Individual Time: 0867-6195 OT Individual Time Calculation (min): 60 min    Patient has met 2 of 4 short term goals.  Patient is progressing well this reporting period. Patient required min-mod assist with functional transfers to left, however requires max assist to transfer to right. Patient requires mod-max cues for initiation of self-care to RUE/RLE, however demonstrates carryover of hemi dressing techniques. Patient's biggest barriers at this time are R hemiplegia, decreased awareness, and expressive aphasia. Patient is demonstrating behaviors consistent with Rancho Level VI.  Patient continues to demonstrate the following deficits: R hemiplegia, decreased standing balance, decreased awareness of RUE/RLE, expressive aphasia, decreased initiation, decreased attention, decreased safety awareness, decreased activity tolerance, decreased coordination, decreased strength and therefore will continue to benefit from skilled OT intervention to enhance overall performance with ADLs, ability to compensate for deficits, postural control, balance, and functional use of RUE/RLE.  Patient progressing toward long term goals..  Continue plan of care.  OT Short Term Goals Week 1:  OT Short Term Goal 1 (Week 1): Pt will complete toilet transfer with min assist and min cues OT Short Term Goal 1 - Progress (Week 1): Progressing toward goal OT Short Term Goal 2 (Week 1): Pt will wash RUE and RLE with mod cues OT Short Term Goal 2 - Progress (Week 1): Met OT Short Term Goal 3 (Week 1): Pt will verbally identify 1 self-care item with max cues OT Short Term Goal 3 - Progress (Week 1): Met OT Short Term Goal 4 (Week 1): Pt will stand with  min assist during self-care task for 30 seconds OT Short Term Goal 4 - Progress (Week 1): Progressing toward goal Week 2:  OT Short Term Goal 1 (Week 2): Pt will stand for 45 sec during self-care task with min assist OT Short Term Goal 2 (Week 2): Pt will complete toilet transfer with min assist OT Short Term Goal 3 (Week 2): Pt will wash 10/10 body parts with min cues for initiation and completion OT Short Term Goal 4 (Week 2): Pt will complete toilet task with mod assist  Skilled Therapeutic Interventions/Progress Updates:    Session 1: Pt seen for ADL retraining with focus on standing balance, R awareness, and expression/object identification. Pt received sitting in w/c c/o pain in LLE d/t muscle spasm. Provided stretching to LLE and pt willing to participate after ~15 min. Pt completed bathing at sink initiating washing 40% of body, requiring max cues to wash the remainder body parts. Pt required mod assist for standing balance during clothing management. Pt completed dressing with max cues for problem solving during hemi dressing technique. Pt stood for oral care ~2 min with mod-max assist while weight bearing through RUE. Pt left sitting in w/c with all needs in reach.   Therapy Documentation Precautions:  Precautions Precautions: Fall Precaution Comments: R hemi, expressive aphasia Restrictions Weight Bearing Restrictions: No Other Position/Activity Restrictions: no limitations given in chart regarding knee ROM or WB'ing, x-ray negative for bony abnormality General:   Vital Signs: Therapy Vitals Temp: 97.8 F (36.6 C) Temp Source: Oral Pulse Rate: 57 Resp: 17 BP: 112/68 mmHg Patient Position (if appropriate): Lying Oxygen Therapy SpO2: 99 % O2 Device: None (Room air) Pain:  Pt with pain in LLE (muscle  spasms). RN provided pain meds.   See FIM for current functional status  Therapy/Group: Individual Therapy  Duayne Cal 12/01/2013, 7:26 AM

## 2013-12-01 NOTE — Progress Notes (Signed)
Physical Therapy Note  Patient Details  Name: [name] MRN: [id number] Date of Birth: 03-15-1980 Today's Date: 12/01/2013  Patient missed 60 minutes of skilled physical therapy this PM secondary to pain and fatigue. Patient very lethargic on arrival and holding LE with facial grimacing. RN citing "new medication" as reason for patient's lethargy. Patient left supine in bed with all needs within reach. Will follow up as able.  Zella RicherBridget S Windie Marasco S. Anthea Udovich, PT, DPT 12/01/2013, 2:16 PM

## 2013-12-01 NOTE — Progress Notes (Signed)
Occupational Therapy Note  Patient Details  Name: Bruce [name] XXXSmith MRN: [id number] Date of Birth: 05/21/1980  Today's Date: 12/01/2013 OT Missed Time: 30 Minutes Missed Time Reason: Patient fatigue  Patient missing 30 min skilled occupational therapy secondary to fatigue. Patient received asleep in bed. Patient aroused to sternal rub for 1-2 seconds before falling back asleep. Patient left supine in bed. Notified RN of missed therapy minutes. Will follow-up as able.   Annebelle Bostic N 12/01/2013, 2:13 PM

## 2013-12-01 NOTE — Progress Notes (Signed)
Physical Therapy Weekly Progress Note  Patient Details  Name: Bruce [name] MRN: [id number] Date of Birth: Jul 26, 1980  Beginning of progress report period: November 24, 2013 End of progress report period: December 01, 2013  Today's Date: 12/01/2013 PT Individual Time: 0800-0900 PT Individual Time Calculation (min): 60 min   Patient has met 2 of 5 short term goals. Patient has made good progress during first reporting period on rehab. Patient is currently functioning at [facility] level overall for bed mobility and functional transfers, maxA for gait, stair negotiation, and standing balance. Patient's greatest limitations are related to cognitive-linguistic deficits, specifically his expressive language, as well as sustained attention. Patient is currently demonstrating behaviors consistent with Rancho Level VI.  Patient continues to demonstrate the following deficits: R hemiplegia, decreased balance, postural control, ability to compensate for deficits, functional use of R side, sustained attention, awareness, coordination, and therefore will continue to benefit from skilled PT intervention to enhance overall performance with activity tolerance, balance, postural control, ability to compensate for deficits, functional use of  right upper extremity and right lower extremity, attention, awareness and coordination.  Patient progressing toward long term goals..  Continue plan of care.  PT Short Term Goals Week 1:  PT Short Term Goal 1 (Week 1): Pt will perform supine<>sit with min A with HOB flat using bed rail. PT Short Term Goal 1 - Progress (Week 1): Progressing toward goal PT Short Term Goal 2 (Week 1): Pt will transfer from bed<>chair (to both R and L sides) with min A and min cueing. PT Short Term Goal 2 - Progress (Week 1): Progressing toward goal PT Short Term Goal 3 (Week 1): Pt will peform static standing x30 seconds with mod A of single therapist. PT Short Term Goal 3 - Progress (Week 1):  Met PT Short Term Goal 4 (Week 1): Pt will perform w/c mobility x50' with mod A and 50% cueing for attention to R side. PT Short Term Goal 4 - Progress (Week 1): Progressing toward goal PT Short Term Goal 5 (Week 1): Pt will perform gait x30' with +2Total A. PT Short Term Goal 5 - Progress (Week 1): Met Week 2:  PT Short Term Goal 1 (Week 2): Patient will perform bed mobility on flat surface without bed rails and with minA. PT Short Term Goal 2 (Week 2): Patient will perform functional transfers with minA. PT Short Term Goal 3 (Week 2): Patient will perform gait training x50' with modA. PT Short Term Goal 4 (Week 2): Patient will perform wheelchair mobility x50' with modA. PT Short Term Goal 5 (Week 2): Patient willl negotiate 3 steps with one handrail and modA.  Skilled Therapeutic Interventions/Progress Updates:    Patient received supine in bed. Session focused on functional transfers, gait training, and R LE NMR; see details below. R DF ace wrap donned for gait training. Gait training 17'x2 with hemiwalker and maxA; Patient requires manual facilitation for upright posture, midline orientation, and advancement/placement/stabilization of R LE. Patient requires significant facilitation for weight shift to the R and demonstrates decreased stance time on R, decreased step length bilaterally, and posterior trunk rotation to the L. Patient requires mod-max cues for sustained attention to task. Patient returned to room and left sitting in wheelchair with seatbelt donned and all needs within reach.  Therapy Documentation Precautions:  Precautions Precautions: Fall Precaution Comments: R hemi, expressive aphasia Restrictions Weight Bearing Restrictions: No Other Position/Activity Restrictions: no limitations given in chart regarding knee ROM or WB'ing, x-ray negative for  bony abnormality Pain: Pain Assessment Pain Assessment: No/denies pain Pain Score: 0-No pain Locomotion  : Ambulation Ambulation/Gait Assistance: 2: Max assist  Other Treatments: Treatments Neuromuscular Facilitation: Right;Upper Extremity;Lower Extremity;Forced use;Activity to increase grading;Activity to increase sustained activation;Activity to increase lateral weight shifting Weight Bearing Technique Weight Bearing Technique: Yes RUE Weight Bearing Technique: High kneeling LUE Weight Bearing Technique: High kneeling Response to Weight Bearing Technique: High kneeling with lateral reaching for colored clothespins; emphasis on color identification, short term memory/recall with 2 step directions  See FIM for current functional status  Therapy/Group: Individual Therapy  Lillia Abed. Sydni Elizarraraz, PT, DPT 12/01/2013, 12:23 PM

## 2013-12-01 NOTE — Progress Notes (Signed)
Patient grabbing at left leg, confirmed that he is having leg cramping, provided PRN Robaxin, repositioned the patient. Will continue to monitor.  Diamantina MonksARPENTER,Benji Poynter, RN

## 2013-12-01 NOTE — Progress Notes (Signed)
Speech Language Pathology Weekly Progress and Session Note  Patient Details  Name: ZIMERE DUNLEVY MRN: [id number] Date of Birth: [birth year]  Beginning of progress report period: November 24, 2013 End of progress report period: December 01, 2013  Today's Date: 12/01/2013 SLP Individual Time: 1100-1140 SLP Individual Time Calculation (min): 40 min  Short Term Goals: Week 1: SLP Short Term Goal 1 (Week 1): Patient will demonstrate focused attention to a functional task for 60 seconds with Max A multimodal cues.  SLP Short Term Goal 1 - Progress (Week 1): Met SLP Short Term Goal 2 (Week 1): Patient will orient to time, place and situation with Mod A multimodal cues.  SLP Short Term Goal 2 - Progress (Week 1): Met SLP Short Term Goal 3 (Week 1): Patient will name functional items with Max A multimodal cues. SLP Short Term Goal 3 - Progress (Week 1): Progressing toward goal SLP Short Term Goal 4 (Week 1): Patient will perfrom verbal, automatic tasks with Max A multimodal cues.  SLP Short Term Goal 4 - Progress (Week 1): Progressing toward goal SLP Short Term Goal 5 (Week 1): Patient will perform oral motor exercises to reduce right side lingual/labial weakness with Max multimodal cues. SLP Short Term Goal 5 - Progress (Week 1): Progressing toward goal SLP Short Term Goal 6 (Week 1): Patient will consume D2 consistency and clear pocketed food, given Mod multimodal cues.  SLP Short Term Goal 6 - Progress (Week 1): Met    New Short Term Goals: Week 2: SLP Short Term Goal 1 (Week 2): Patient will demonstrate sustained attention to a structured task for 2 minutes with Mod A multimodal cues.  SLP Short Term Goal 2 (Week 2): Patient will name functional items with Max A multimodal cues. SLP Short Term Goal 3 (Week 2): Patient will perfrom verbal, automatic tasks with Max A multimodal cues.  SLP Short Term Goal 4 (Week 2): Patient will perform oral motor exercises to reduce right side lingual/labial  weakness with Max A multimodal cues. SLP Short Term Goal 5 (Week 2): Patient will consume Dys 2 textures with use of compensatory strategies with Min A multimodal cues.   Weekly Progress Updates: Patient has made functional gains and has met 3 out of 6 short term goals this reporting period due to improved focused attention, orientation and toleration of Dys 2 textures.  Currently, patient continues to require Mod assist for cognitive tasks and Max assist for basic verbal expression.  Patient and family education is ongoing. As a result, patient would benefit from continued skilled SLP intervention to maximize cognitive-linguistic abilities and overall swallow function in order to maximize his functional independence prior to discharge with 24/7 Supervision.    Intensity: Minumum of 1-2 x/day, 30 to 90 minutes Frequency: 5 out of 7 days Duration/Length of Stay: TBD Treatment/Interventions: Functional tasks;Patient/family education;Dysphagia/aspiration precaution training;Oral motor exercises;Cueing hierarchy;Cognitive remediation/compensation;Environmental controls;Internal/external aids;Multimodal communication approach;Speech/Language facilitation;Therapeutic Activities   Daily Session  Skilled Therapeutic Interventions: Skilled treatment session focused on addressing speech-language goals. SLP facilitated session with Total A multimodal cues for automatic verbal sequences and to label objects with CVC structure.  Patient demonstrated perseveration on pain and clinician attempts at repositioning and redirecting were not effective as a result, session was ended 20 minutes early after patient was returned to bed.  Continue with current plan of care.     FIM:  Comprehension Comprehension Mode: Auditory Comprehension: 2-Understands basic 25 - 49% of the time/requires cueing 51 - 75% of the time Expression  Expression Mode: Verbal Expression: 1-Expresses basis less than 25% of the time/requires  cueing greater than 75% of the time. Social Interaction Social Interaction: 2-Interacts appropriately 25 - 49% of time - Needs frequent redirection. Problem Solving Problem Solving: 2-Solves basic 25 - 49% of the time - needs direction more than half the time to initiate, plan or complete simple activities Memory Memory: 2-Recognizes or recalls 25 - 49% of the time/requires cueing 51 - 75% of the time FIM - Eating Eating Activity: 0: Activity did not occur General    Pain  10/10 worst pain RN aware and provided medication, patient was repositioned  Therapy/Group: Individual Therapy  Carmelia Roller., [place] [phone]  [place] 12/01/2013, 4:31 PM

## 2013-12-02 ENCOUNTER — Inpatient Hospital Stay (HOSPITAL_COMMUNITY): Payer: Self-pay

## 2013-12-02 ENCOUNTER — Inpatient Hospital Stay (HOSPITAL_COMMUNITY): Payer: Self-pay | Admitting: Speech Pathology

## 2013-12-02 DIAGNOSIS — S069X0A Unspecified intracranial injury without loss of consciousness, initial encounter: Secondary | ICD-10-CM

## 2013-12-02 DIAGNOSIS — R4701 Aphasia: Secondary | ICD-10-CM | POA: Diagnosis present

## 2013-12-02 DIAGNOSIS — S069X9A Unspecified intracranial injury with loss of consciousness of unspecified duration, initial encounter: Secondary | ICD-10-CM

## 2013-12-02 DIAGNOSIS — S0190XA Unspecified open wound of unspecified part of head, initial encounter: Secondary | ICD-10-CM

## 2013-12-02 DIAGNOSIS — S069XAA Unspecified intracranial injury with loss of consciousness status unknown, initial encounter: Secondary | ICD-10-CM | POA: Diagnosis present

## 2013-12-02 MED ORDER — DOCUSATE SODIUM 100 MG PO CAPS
100.0000 mg | ORAL_CAPSULE | Freq: Two times a day (BID) | ORAL | Status: DC
  Administered 2013-12-03 – 2013-12-19 (×33): 100 mg via ORAL
  Filled 2013-12-02 (×38): qty 1

## 2013-12-02 MED ORDER — PANTOPRAZOLE SODIUM 40 MG PO TBEC
40.0000 mg | DELAYED_RELEASE_TABLET | Freq: Every day | ORAL | Status: DC
  Administered 2013-12-03 – 2013-12-19 (×17): 40 mg via ORAL
  Filled 2013-12-02 (×18): qty 1

## 2013-12-02 NOTE — Progress Notes (Signed)
Speech Language Pathology Daily Session Note  Patient Details  Name: Bruce [name] XXXSmith MRN: [id number] Date of Birth: [birth year]  Today's Date: 12/02/2013 SLP Individual Time: [id number] SLP Individual Time Calculation (min): 30 min  Short Term Goals: Week 2: SLP Short Term Goal 1 (Week 2): Patient will demonstrate sustained attention to a structured task for 2 minutes with Mod A multimodal cues.  SLP Short Term Goal 2 (Week 2): Patient will name functional items with Max A multimodal cues. SLP Short Term Goal 3 (Week 2): Patient will perfrom verbal, automatic tasks with Max A multimodal cues.  SLP Short Term Goal 4 (Week 2): Patient will perform oral motor exercises to reduce right side lingual/labial weakness with Max A multimodal cues. SLP Short Term Goal 5 (Week 2): Patient will consume Dys 2 textures with use of compensatory strategies with Min A multimodal cues.   Skilled Therapeutic Interventions:  Pt was seen for skilled ST targeting cognitive-linguistic goals.  SLP facilitated the session with max phonemic placement cues for pt to produce approximations of functional, meaningful words for ~75% accuracy (family members' names).   Pt was also able to produce approximations of real word utterances to complete basic, automatic sequences with mod-max phonemic placement cues for ~60% accuracy.  SLP initiated trials of CV repetitions to facilitate improved consistency when producing sounds with pt able to complete task for 50% accuracy with max phonemic placement cues.  Continue per current plan of care.  FIM:  Comprehension Comprehension Mode: Auditory Comprehension: 3-Understands basic 50 - 74% of the time/requires cueing 25 - 50%  of the time Expression Expression Mode: Verbal Expression: 2-Expresses basic 25 - 49% of the time/requires cueing 50 - 75% of the time. Uses single words/gestures. Social Interaction Social Interaction: 3-Interacts appropriately 50 - 74% of the time - May be  physically or verbally inappropriate. Problem Solving Problem Solving: 2-Solves basic 25 - 49% of the time - needs direction more than half the time to initiate, plan or complete simple activities Memory Memory: 2-Recognizes or recalls 25 - 49% of the time/requires cueing 51 - 75% of the time FIM - Eating Eating Activity: 5: Needs verbal cues/supervision  Pain Pain Assessment Pain Assessment: No/denies pain  Therapy/Group: Individual Therapy  Marybell Robards, Melanee SpryNicole L 12/02/2013, 4:30 PM
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
## 2013-12-02 NOTE — Progress Notes (Signed)
Physical Therapy Session Note  Patient Details  Name: Bruce [name] MRN: [id number] Date of Birth: 02-19-1980  Today's Date: 12/02/2013 PT Individual Time: 1000-1100 PT Individual Time Calculation (min): 60 min   Short Term Goals: Week 1:  PT Short Term Goal 1 (Week 1): Pt will perform supine<>sit with min A with HOB flat using bed rail. PT Short Term Goal 1 - Progress (Week 1): Progressing toward goal PT Short Term Goal 2 (Week 1): Pt will transfer from bed<>chair (to both R and L sides) with min A and min cueing. PT Short Term Goal 2 - Progress (Week 1): Progressing toward goal PT Short Term Goal 3 (Week 1): Pt will peform static standing x30 seconds with mod A of single therapist. PT Short Term Goal 3 - Progress (Week 1): Met PT Short Term Goal 4 (Week 1): Pt will perform w/c mobility x50' with mod A and 50% cueing for attention to R side. PT Short Term Goal 4 - Progress (Week 1): Progressing toward goal PT Short Term Goal 5 (Week 1): Pt will perform gait x30' with +2Total A. PT Short Term Goal 5 - Progress (Week 1): Met  Skilled Therapeutic Interventions/Progress Updates:    Pt received asleep, supine in bed. Upon awaking pt, he attempts to communicate the needs to use the bathroom and then reports "to late." PT assisted pt with changing of brief and hygiene. Pt only with incontinent urine, requiring changing of brief and gown. Pt able to perform hygiene with set-up and PT assist pt with rolling to the left side. Pt completes supine to sit transfer with min A and cues to advance the RLE. Pt request to don clothes and cued for proper technique/sequencing of donning his shirt. PT assisted pt with donning of shorts.  Pt transfers from bed to w/c with min A for steadying and min cues for safety and placement of the RLE. Pt able to propel wheel chair in hallway using L hemi technique and cues for pacing and use of the LLE. Pt requires min A for turning and overall negotiation. Pt focused on  calling himself "what a dummy" today. PT redirected pt's focus to other objects, pt's son and the treatment session. Pt ambulated in hallway using L rail, requiring max A for advancement of the RLE and PT providing blocking of the R knee to prevent buckling. Verbal and tactile cues given to maintain upright posturing. Pt ambulated 20 feet x2 with a seated rest break after each trial. Pt returned to room and assisted to supine. All needs met and within reach. Bed exit alarm on.   Therapy Documentation Precautions:  Precautions Precautions: Fall Precaution Comments: R hemi, expressive aphasia Restrictions Weight Bearing Restrictions: No Other Position/Activity Restrictions: no limitations given in chart regarding knee ROM or WB'ing, x-ray negative for bony abnormality    Pain: Pain Assessment Pain Assessment: Faces Faces Pain Scale: Hurts little more Mobility:   Locomotion : Ambulation Ambulation/Gait Assistance: 2: Max Financial controller Distance: 80   See FIM for current functional status  Therapy/Group: Individual Therapy  Jaque Dacy R 12/02/2013, 12:31 PM

## 2013-12-03 ENCOUNTER — Inpatient Hospital Stay (HOSPITAL_COMMUNITY): Payer: Self-pay | Admitting: Rehabilitation

## 2013-12-03 NOTE — Progress Notes (Signed)
Physical Therapy Session Note  Patient Details  Name: Bruce [name] MRN: [id number] Date of Birth: [birth year]  Today's Date: 12/03/2013 PT Individual Time: 1400-1505 PT Individual Time Calculation (min): 65 min   Short Term Goals: Week 2:  PT Short Term Goal 1 (Week 2): Patient will perform bed mobility on flat surface without bed rails and with minA. PT Short Term Goal 2 (Week 2): Patient will perform functional transfers with minA. PT Short Term Goal 3 (Week 2): Patient will perform gait training x50' with modA. PT Short Term Goal 4 (Week 2): Patient will perform wheelchair mobility x50' with modA. PT Short Term Goal 5 (Week 2): Patient willl negotiate 3 steps with one handrail and modA.  Skilled Therapeutic Interventions/Progress Updates:   Pt received lying in bed in room, agreeable to therapy session.  Noted poster board with family members in room, therefore had pt identify each person as best able to work on phonation of meaningful words/phrases.  Pt able to accurately identify his sister and son in pictures 100% of time, however unable to state her name or his son's name.  Also requires max cues to identify sister's boyfriend/husband in picture.  He continues to demonstrate poor frustration tolerance during session and calling himself "stupid" or "dummy."  Continue to explain these issues are due to brain injury and not his fault.  Performed bed mobility at mod A level.  Pt attempting to use LLE to assist RLE out of bed, however requires max verbal cues for rolling into SL then to sitting with mod A for weight shift of trunk.  Transferred to chair with mod A, mod cues for safety due to impulsivity.  Assisted pt to therapy gym to engage in gait with hemi walker.  Donned ace wrap for R DF assist.  Took a few steps however needing to sit soon due to catheter.  Pt began to attempt communication with therapist regarding being frustrated with the morning and day and being frustrated about not  getting out of bed.  He was finally able to state "I'm hungry."  Assisted back to nursing station and nurse tech notified, ordered lunch and it was delivered.  While waiting for lunch, engaged in game of Blink and Spot it.  Pt requires total A cues for game of Blink and total A cues to state how cards match.  Pt able to correctly identify items during spot it but requires total A cues for verbalization of item that is alike.  Pt extremely internally and externally distracted during game.  Remainder of session focused on safe swallowing while eating meal.  Had pt scan R for utensils and drink.  Assisted back to room and RN assisted with remainder of lunch.  Quick release belt donned and all needs in reach.   Therapy Documentation Precautions:  Precautions Precautions: Fall Precaution Comments: R hemi, expressive aphasia Restrictions Weight Bearing Restrictions: No Other Position/Activity Restrictions: no limitations given in chart regarding knee ROM or WB'ing, x-ray negative for bony abnormality   Pain: Pt with no c/o or s/s of pain during session.   See FIM for current functional status  Therapy/Group: Individual Therapy  Vista Deckarcell, Ryla Cauthon Ann 12/03/2013, 3:11 PM
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
## 2013-12-04 ENCOUNTER — Inpatient Hospital Stay (HOSPITAL_COMMUNITY): Payer: Medicaid Other | Admitting: *Deleted

## 2013-12-04 ENCOUNTER — Inpatient Hospital Stay (HOSPITAL_COMMUNITY): Payer: Medicaid Other | Admitting: Speech Pathology

## 2013-12-04 ENCOUNTER — Inpatient Hospital Stay (HOSPITAL_COMMUNITY): Payer: Medicaid Other

## 2013-12-04 DIAGNOSIS — R338 Other retention of urine: Secondary | ICD-10-CM

## 2013-12-04 NOTE — Progress Notes (Signed)
Speech Language Pathology Daily Session Notes  Patient Details  Name: [name] MRN: [id number] Date of Birth: October 26, 1980  Today's Date: 12/04/2013  Session 1 : SLP Individual Time: [id number] SLP Individual Time Calculation (min): 55 min  Session 2: SLP Individual Time: 1425-1500 SLP Individual Time Calculation (min): 35 min  Short Term Goals: Week 2: SLP Short Term Goal 1 (Week 2): Patient will demonstrate sustained attention to a structured task for 2 minutes with Mod A multimodal cues.  SLP Short Term Goal 2 (Week 2): Patient will name functional items with Max A multimodal cues. SLP Short Term Goal 3 (Week 2): Patient will perfrom verbal, automatic tasks with Max A multimodal cues.  SLP Short Term Goal 4 (Week 2): Patient will perform oral motor exercises to reduce right side lingual/labial weakness with Max A multimodal cues. SLP Short Term Goal 5 (Week 2): Patient will consume Dys 2 textures with use of compensatory strategies with Min A multimodal cues.   Skilled Therapeutic Interventions:  Session 1: Skilled treatment session focused on cognitive-linguistic goals. SLP facilitated session by providing Mod A multimodal cues for functional problem solving during self-care task of brushing his teeth.  RN present and was administering medications and patient asked appropriate questions in regards to function of medications ("why" and "what's that for?") Patient also utilized automatic phrases throughout the session such as "thank you," "I know, right," and "really" with Mod I but required Max A phonemic placement and verbal cues for verbal expression at the word level of /sh/ CVC words, however, patient was able to verbalize all words with repetition and demonstration cues. Patient was also oriented to place and situation with 100% accuracy when given written choices from a field of 3. Patient transferred to bed from wheelchair due to patient reports of being "sleepy." Patient left  supine in bed with bed alarm on and all needs within reach. Continue with current plan of care.    Session 2: Skilled treatment session focused on cognitive-linguistic goals. SLP facilitated the session by providing Min A visual and tactile cues with coins to count from 1-10 but required total A multimodal cues for verbalization of the days of the week. SLP also facilitated session by introducing a communication board of pictures in order to increase functional communication with staff, however, patient was ~50% accurate, therefore, the board is not functional at this time. Patient identified biographical information from a written aid from a field of 3 with 100% accuracy. Patient then required Max-total A multimodal cues to verbalize at the word level of /f/ CVC words with written aids. Patient continues to use automatic words and phrases during tasks, especially with increased frustration. Patient left in room with quick release belt in place and all needs within reach. Continue with current plan of care.   FIM:  Comprehension Comprehension Mode: Auditory Comprehension: 4-Understands basic 75 - 89% of the time/requires cueing 10 - 24% of the time Expression Expression Mode: Verbal Expression: 2-Expresses basic 25 - 49% of the time/requires cueing 50 - 75% of the time. Uses single words/gestures. Social Interaction Social Interaction: 3-Interacts appropriately 50 - 74% of the time - May be physically or verbally inappropriate. Problem Solving Problem Solving: 2-Solves basic 25 - 49% of the time - needs direction more than half the time to initiate, plan or complete simple activities Memory Memory: 2-Recognizes or recalls 25 - 49% of the time/requires cueing 51 - 75% of the time  Pain Pain Assessment Pain Assessment: No/denies pain  Therapy/Group: Individual Therapy  Latisha Lasch 12/04/2013, 11:59 AM
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
## 2013-12-04 NOTE — Progress Notes (Signed)
Physical Therapy Session Note  Patient Details  Name: Bruce [name] MRN: [id number] Date of Birth: 1980/11/22  Today's Date: 12/04/2013 PT Individual Time: 0800-0900 PT Individual Time Calculation (min): 60 min   Short Term Goals: Week 2:  PT Short Term Goal 1 (Week 2): Patient will perform bed mobility on flat surface without bed rails and with minA. PT Short Term Goal 2 (Week 2): Patient will perform functional transfers with minA. PT Short Term Goal 3 (Week 2): Patient will perform gait training x50' with modA. PT Short Term Goal 4 (Week 2): Patient will perform wheelchair mobility x50' with modA. PT Short Term Goal 5 (Week 2): Patient willl negotiate 3 steps with one handrail and modA.  Skilled Therapeutic Interventions/Progress Updates:  1:1. Pt received supine in bed, ready for therapy. Focus this session on functional transfers, w/c management, B LE stretching and NMR. Pt req min A for t/f sup>sit EOB with use of bed rails and min A for squat pivot transfer bed>w/c<>NuStep. Pt switched from 18x16 basic wheelchair to 16x16 with basic cushion, adjustable tension back for improved postural support and lower seat height to allow for improved B foot contact with floor for propulsion. Emphasis on hemi-technique with use of L UE/LE during w/c propulsion 250'x1 and 150'x1, req mod-max A overall with max multimodal cues. Pt demonstrates difficulty dual tasking UE with LE as well as sequencing of turns.   Stretching performed to B hamstrings 3x30seconds to address muscle extensibility as pt is very sensitive to straightening of B LE upon initially getting OOB.   Pt utilized NuStep to target B LE reciprocal coordination (with use of L UE), B hamstring stretch and R heel contact with manual facilitation to R LE provided by therapist. Pt with good tolerance to level 2, 5min and 4min.   Pt left sitting in w/c at end of session w/ all needs in reach, quick release belt in place and OT in room.     Therapy Documentation Precautions:  Precautions Precautions: Fall Precaution Comments: R hemi, expressive aphasia Restrictions Weight Bearing Restrictions: No Other Position/Activity Restrictions: no limitations given in chart regarding knee ROM or WB'ing, x-ray negative for bony abnormality    See FIM for current functional status  Therapy/Group: Individual Therapy  [name], [name] 12/04/2013, 12:06 PM

## 2013-12-04 NOTE — Progress Notes (Signed)
Occupational Therapy Session Note  Patient Details  Name: Bruce [name] MRN: [id number] Date of Birth: 1980/04/10  Today's Date: 12/04/2013 OT Individual Time: [id number] and 1330-1400 OT Individual Time Calculation (min): 45 min and 30 min     Short Term Goals: Week 2:  OT Short Term Goal 1 (Week 2): Pt will stand for 45 sec during self-care task with min assist OT Short Term Goal 2 (Week 2): Pt will complete toilet transfer with min assist OT Short Term Goal 3 (Week 2): Pt will wash 10/10 body parts with min cues for initiation and completion OT Short Term Goal 4 (Week 2): Pt will complete toilet task with mod assist  Skilled Therapeutic Interventions/Progress Updates:    Session 1: Pt seen for 1:1 OT session with focus on initiation, cognitive remediation, and self-feeding. Pt received sitting in w/c reporting "hungry." Engaged in self-feeding with min cues due to pocketing. Pt with improved problem solving to open sugar packets with R hand only. Engaged in therapeutic conversation during self-feeding. Pt oriented x4 with field of 3 via pointing to items. Utilized poster of family members with focus on pt verbalizing names and relation. Pt with 75% accuracy identifying relation to person. Pt required max-total assist for verbalizing names of family members. Pt with poor frustration tolerance during activity as he would repeatedly state "dummy." Pt left sitting in w/c with all needs in reach.  Session 2: Pt seen for 1:1 OT session with focus on standing balance, functional transfers, and hemi dressing. Pt received supine in bed frustrated as he was attempting to remove condom catheter. Therapist assisted. Completed stand pivot transfer bed>w/c with mod assist and min cues for sequencing. Pt agreeable to attempt toileting (pt on timed toileting protocol). With gestures and increased time, pt communicated request to complete toileting in standing. Pt required mod assist for standing balance  during toileting with pt having continent episode. Donned new brief with pt recalling hemi dressing technique with increased time and requiring assist to manage RLE for crossover technique. Pt stood with min assist to manage clothing around waist. Pt completed oral care in standing with min-mod assist while weightbearing through RUE. Pt required total assist to use RUE as stabilizer when applying toothpaste. Pt left sitting in w/c with all needs in reach. Notified RN of continent episode and timed toileting protocol.   Therapy Documentation Precautions:  Precautions Precautions: Fall Precaution Comments: R hemi, expressive aphasia Restrictions Weight Bearing Restrictions: No Other Position/Activity Restrictions: no limitations given in chart regarding knee ROM or WB'ing, x-ray negative for bony abnormality General:   Vital Signs:   Pain: Pain Assessment Pain Assessment: No/denies pain Faces Pain Scale: Hurts even more (unable to state where)  See FIM for current functional status  Therapy/Group: Individual Therapy  Daneil Danerkinson, Bruce [name] 12/04/2013, 12:30 PM
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
## 2013-12-05 ENCOUNTER — Inpatient Hospital Stay (HOSPITAL_COMMUNITY): Payer: Medicaid Other | Admitting: *Deleted

## 2013-12-05 ENCOUNTER — Inpatient Hospital Stay (HOSPITAL_COMMUNITY): Payer: Medicaid Other | Admitting: Speech Pathology

## 2013-12-05 ENCOUNTER — Inpatient Hospital Stay (HOSPITAL_COMMUNITY): Payer: Medicaid Other

## 2013-12-05 NOTE — Progress Notes (Signed)
Occupational Therapy Session Note  Patient Details  Name: [name] MRN: [id number] Date of Birth: 11-Oct-1980  Today's Date: 12/05/2013 OT Individual Time: [id number] OT Individual Time Calculation (min): 60 min    Short Term Goals: Week 2:  OT Short Term Goal 1 (Week 2): Pt will stand for 45 sec during self-care task with min assist OT Short Term Goal 2 (Week 2): Pt will complete toilet transfer with min assist OT Short Term Goal 3 (Week 2): Pt will wash 10/10 body parts with min cues for initiation and completion OT Short Term Goal 4 (Week 2): Pt will complete toilet task with mod assist  Skilled Therapeutic Interventions/Progress Updates:    Pt seen for ADL retraining with focus on task initiation, functional transfers, cognitive remediation, and standing balance. Pt received supine in bed, easily aroused. Completed stand pivot transfer bed>w/c with min assist. Stood for toilet task (continent episode) with min assist for balance. Completed bathing at shower level with min assist and pt initiating crossover technique with RLE to wash feet. Pt washed 8/10 body parts without cues. Completed dressing at sink with max cues for sequencing and problem solving. Pt required min assist for sit<>stand and standing balance during LB dressing. Pt verbally identified 3 self-care items using min multimodal cues. Provided GivMohr sling for RUE to provide ultimate positioning and comfort when standing with therapy. Pt indicated no discomfort with sling and appeared to like it as he said "thank you." Pt left sitting in w/c with all needs in reach.   Therapy Documentation Precautions:  Precautions Precautions: Fall Precaution Comments: R hemi, expressive aphasia Restrictions Weight Bearing Restrictions: No Other Position/Activity Restrictions: no limitations given in chart regarding knee ROM or WB'ing, x-ray negative for bony abnormality General:   Vital Signs: Therapy Vitals Pulse Rate:  82 BP: 120/92 mmHg Pain: Pain Assessment Pain Assessment: No/denies pain  See FIM for current functional status  Therapy/Group: Individual Therapy  Khamani Fairley, Vara GuardianKayla N 12/05/2013, 10:40 AM

## 2013-12-05 NOTE — Progress Notes (Signed)
Physical Therapy Session Note  Patient Details  Name: Bruce [name] XXXSmith MRN: [id number] Date of Birth: [birth year]  Today's Date: 12/05/2013 PT Individual Time: 1030-1130 and 1530-1630 PT Individual Time Calculation (min): 60 min and 60 min  Short Term Goals: Week 2:  PT Short Term Goal 1 (Week 2): Patient will perform bed mobility on flat surface without bed rails and with minA. PT Short Term Goal 2 (Week 2): Patient will perform functional transfers with minA. PT Short Term Goal 3 (Week 2): Patient will perform gait training x50' with modA. PT Short Term Goal 4 (Week 2): Patient will perform wheelchair mobility x50' with modA. PT Short Term Goal 5 (Week 2): Patient willl negotiate 3 steps with one handrail and modA.  Skilled Therapeutic Interventions/Progress Updates:    AM Session: Patient received sitting in wheelchair. Session focused on functional transfers, R UE/LE NMR, standing balance, and cognitive remediation; see details below. Added R elevating leg rest to patient's wheelchair to facilitate R hamstring/gastroc stretch while seated in wheelchair. Patient returned to room and left seated in wheelchair with seatbelt donned and all needs within reach.  PM Session: Patient received sitting in wheelchair. Session focused on R LE NMR, gait training, and cognitive remediation. L LE stair taps with L UE support on handrail to facilitate increased weight shift to and weight bearing through R LE, patient requires max multimodal cues to count reps to 20. Gait training x25' with L handrail and maxA, patient requires manual facilitation for upright posture, midline orientation, advancement/placement/stabilization of R LE. Patient returned to room and left seated in wheelchair with seatbelt donned and all needs within reach.   Therapy Documentation Precautions:  Precautions Precautions: Fall Precaution Comments: R hemi, expressive aphasia Restrictions Weight Bearing Restrictions: No Other  Position/Activity Restrictions: no limitations given in chart regarding knee ROM or WB'ing, x-ray negative for bony abnormality Pain: Pain Assessment Pain Assessment: Faces Faces Pain Scale: Hurts whole lot (unable to state location of pain) Locomotion : Ambulation Ambulation/Gait Assistance: Not tested (comment)  Other Treatments: Treatments Neuromuscular Facilitation: Right;Upper Extremity;Forced use;Activity to increase sustained activation;Lower Extremity;Activity to increase timing and sequencing;Activity to increase motor control;Activity to increase grading;Activity to increase lateral weight shifting;Activity to increase anterior-posterior weight shifting Weight Bearing Technique Weight Bearing Technique: Yes RUE Weight Bearing Technique: Extended arm seated;Other (comment) (side sitting propped on R elbow) Response to Weight Bearing Technique: Patient unable to tolerate long periods of weight bearing through elbow secondary to increased fear/anxiety with weight shift to the R. Repeated tandem sit<>stands with L foot anterior to facilitate increaed weight bearing through R LE; After each tandem sit<>stand, patient reaches for specific letters posiionted on his L and then place on R to improved lateral weight shifts to B sides, max cues for accuracy with correct letters, mod-max cues for maintaining accuracy with working memory. Additionally, pre-gait activities: forward/retro/side stepping with L LE to facilitate increased weight shift to the R and weight bearing through R LE.  See FIM for current functional status  Therapy/Group: Individual Therapy  Chipper HerbBridget S Febe Champa S. Kailo Kosik, PT, DPT 12/05/2013, 11:56 AM
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
## 2013-12-05 NOTE — Progress Notes (Signed)
Speech Language Pathology Daily Session Notes  Patient Details  Name: Bruce [name] MRN: [id number] Date of Birth: Apr 09, 1980  Today's Date: 12/05/2013  Session 1: SLP Individual Time: 0900-1000 SLP Individual Time Calculation (min): 60 min  Session 2: SLP Individual Time: [id number] SLP Individual Time Calculation (min): 35 min  Short Term Goals: Week 2: SLP Short Term Goal 1 (Week 2): Patient will demonstrate sustained attention to a structured task for 2 minutes with Mod A multimodal cues.  SLP Short Term Goal 2 (Week 2): Patient will name functional items with Max A multimodal cues. SLP Short Term Goal 3 (Week 2): Patient will perfrom verbal, automatic tasks with Max A multimodal cues.  SLP Short Term Goal 4 (Week 2): Patient will perform oral motor exercises to reduce right side lingual/labial weakness with Max A multimodal cues. SLP Short Term Goal 5 (Week 2): Patient will consume Dys 2 textures with use of compensatory strategies with Min A multimodal cues.   Skilled Therapeutic Interventions:  Session 1: Skilled treatment session focused on dysphagia and cognitive-linguistic goals. Upon arrival, patient was sitting upright in his wheelchair and was beginning to eat breakfast. Patient consumed breakfast meal of Dys. 2 textures with thin liquids with an intermittent wet vocal quality that cleared with spontaneous throat clears, suspect due to verbalizing with a full oral cavity of food. Patient also required Min A multimodal cues for utilization of small bites/sips but cleared his right buccal pocketing with Mod I. Patient required total A multimodal cues to name items on breakfast tray but was able to utilize increased spontaneous speech at the phrase level to express wants/needs. Patient participated in the self-care task of brushing his teeth and required Mod A multimodal cues for functional problem solving with the task and demonstrated selective attention to tasks for ~30 minutes  with Min A multimodal cues. Patient left in the wheelchair with quick release in place and all needs within reach. Continue with current plan of care.    Session 2: Skilled treatment session focused on cognitive-linguistic goals. Upon arrival, patient was sitting upright in the wheelchair. Patient requested to use brush his teeth utilizing gestures and performed task with Mod  A multimodal cues and it should be noted that patient cleared a moderate amount residue from his lunch meal of Dys. 2 textures from his oral cavity. SLP facilitated the session by providing Max A multimodal cues to name colors and numbers and Min A multimodal cues and extra time for problem solving, selective attention and attention to right field of enviornment during a basic card game. Patient left in the wheelchair with quick release belt in place and all needs within reach. Continue with current plan of care.   FIM:  Comprehension Comprehension Mode: Auditory Comprehension: 4-Understands basic 75 - 89% of the time/requires cueing 10 - 24% of the time Expression Expression Mode: Verbal Expression: 2-Expresses basic 25 - 49% of the time/requires cueing 50 - 75% of the time. Uses single words/gestures. Social Interaction Social Interaction: 3-Interacts appropriately 50 - 74% of the time - May be physically or verbally inappropriate. Problem Solving Problem Solving: 2-Solves basic 25 - 49% of the time - needs direction more than half the time to initiate, plan or complete simple activities Memory Memory: 3-Recognizes or recalls 50 - 74% of the time/requires cueing 25 - 49% of the time FIM - Eating Eating Activity: 5: Supervision/cues;5: Set-up assist for open containers  Pain Pain Assessment Pain Assessment: No/denies pain  Therapy/Group: Individual Therapy  Zilla Shartzer 12/05/2013, 10:31 AM

## 2013-12-06 ENCOUNTER — Inpatient Hospital Stay (HOSPITAL_COMMUNITY): Payer: Self-pay

## 2013-12-06 ENCOUNTER — Inpatient Hospital Stay (HOSPITAL_COMMUNITY): Payer: Medicaid Other

## 2013-12-06 ENCOUNTER — Inpatient Hospital Stay (HOSPITAL_COMMUNITY): Payer: Self-pay | Admitting: Speech Pathology

## 2013-12-06 MED ORDER — TIZANIDINE HCL 2 MG PO TABS
2.0000 mg | ORAL_TABLET | Freq: Three times a day (TID) | ORAL | Status: DC
  Administered 2013-12-06 – 2013-12-18 (×37): 2 mg via ORAL
  Filled 2013-12-06 (×40): qty 1

## 2013-12-06 MED ORDER — BETHANECHOL CHLORIDE 10 MG PO TABS
10.0000 mg | ORAL_TABLET | Freq: Four times a day (QID) | ORAL | Status: DC
  Administered 2013-12-06 – 2013-12-11 (×22): 10 mg via ORAL
  Filled 2013-12-06 (×28): qty 1

## 2013-12-06 NOTE — Progress Notes (Signed)
Occupational Therapy Session Note  Patient Details  Name: Bruce [name] MRN: [id number] Date of Birth: [birth year]  Today's Date: 12/06/2013 OT Individual Time: [id number] OT Individual Time Calculation (min): 60 min    Short Term Goals: Week 2:  OT Short Term Goal 1 (Week 2): Pt will stand for 45 sec during self-care task with min assist OT Short Term Goal 2 (Week 2): Pt will complete toilet transfer with min assist OT Short Term Goal 3 (Week 2): Pt will wash 10/10 body parts with min cues for initiation and completion OT Short Term Goal 4 (Week 2): Pt will complete toilet task with mod assist  Skilled Therapeutic Interventions/Progress Updates:    Pt seen for ADL retraining with focus on task initiation, functional transfers, cognitive remediation, and standing balance. Pt received supine in bed, easily aroused. Completed stand pivot transfer bed>w/c with mod assist. Stood for toilet task (continent episode) with min assist for balance. Completed bathing at shower level with min assist and pt initiating crossover technique with RLE to wash feet. Pt required min cues to wash all body parts. Completed dressing at sink with mod cues for sequencing and problem solving. Pt required min assist for sit<>stand and standing balance during LB dressing. Completed oral care in standing with light weight bearing through RUE and pt tolerating well. Pt completed oral care 2x in standing with min-mod assist approx 5 min. Pt left sitting in w/c with all needs in reach.     Therapy Documentation Precautions:  Precautions Precautions: Fall Precaution Comments: R hemi, expressive aphasia Restrictions Weight Bearing Restrictions: No Other Position/Activity Restrictions: no limitations given in chart regarding knee ROM or WB'ing, x-ray negative for bony abnormality General:   Vital Signs: Therapy Vitals Temp: 97.9 F (36.6 C) Temp Source: Oral Pulse Rate: 68 Resp: 18 BP: 93/66 mmHg Oxygen  Therapy SpO2: 100 % O2 Device: None (Room air) Pain: Pt with pain in LLE d/t tight hamstrings. Therapist provided stretching and pt with less pain noted.   See FIM for current functional status  Therapy/Group: Individual Therapy  Bruce [name], Bruce [name] 12/06/2013, 9:38 AM

## 2013-12-06 NOTE — Progress Notes (Signed)
Physical Therapy Session Note  Patient Details  Name: Bruce [name] MRN: [id number] Date of Birth: September 30, 1980  Today's Date: 12/06/2013 PT Individual Time: 1000-1100 PT Individual Time Calculation (min): 60 min  Session 2 Time: 1530-1630 Time Calculation (min): 60 min  Short Term Goals: Week 2:  PT Short Term Goal 1 (Week 2): Patient will perform bed mobility on flat surface without bed rails and with minA. PT Short Term Goal 2 (Week 2): Patient will perform functional transfers with minA. PT Short Term Goal 3 (Week 2): Patient will perform gait training x50' with modA. PT Short Term Goal 4 (Week 2): Patient will perform wheelchair mobility x50' with modA. PT Short Term Goal 5 (Week 2): Patient willl negotiate 3 steps with one handrail and modA.  Skilled Therapeutic Interventions/Progress Updates:    Session 1: Pt received seated in w/c receiving nursing care, agreeable to participate in therapy. After pt able to propel w/c 100' x2 to/from rehab gym w/ MinA, mod VC's to use L foot for steering w/c and to avoid obstacles in environment. Pt transferred w/c<> mat table to L each time w/ ModA for stand pivot transfer. Pt participated in NMR treatment for RUE/RLE, see details below. Pt left seated in w/c w/ QRB on, half lap tray on, and all needs within reach.   Session 2: Pt received handed off from SLP, agreeable to participate in therapy. Pt propelled w/c 100' to rehab gym w/ MinA for maintaining attention on task, max vc's for use of L foot to steer. Worked on standing balance/gait activities in parallel bars including forward/backward walking w/ Mod-MaxA, 10x L foot tapping on 5 inch step w/ x1 step up. Pt then ambulated 25' x2 w/ MaxA, L hallway handrail. Pt had GiveMohr sling donned on RUE throughout session. With all ambulation therapist provided assist for advancing R limb, blocking R knee in stance phase, weight shifting pt to R to advance L limb w/o LOB. On way back to room pt had  difficulty using L foot to propel w/c forward, even w/ max tactile and verbal cueing from therapist. Suspect difficulty motor planning due to distractible environment and fatigue. Pt required MaxA to propel w/c back to room. Pt left seated in w/c w/ QRB on and all needs within reach.    Therapy Documentation Precautions:  Precautions Precautions: Fall Precaution Comments: R hemi, expressive aphasia Restrictions Weight Bearing Restrictions: No Other Position/Activity Restrictions: no limitations given in chart regarding knee ROM or WB'ing, x-ray negative for bony abnormality General:   Vital Signs: Therapy Vitals Temp: 97.9 F (36.6 C) Temp Source: Oral Pulse Rate: 68 Resp: 18 BP: 93/66 mmHg Oxygen Therapy SpO2: 100 % O2 Device: None (Room air) Pain:   Mobility:   Locomotion : Ambulation Ambulation/Gait Assistance: 2: Max assist  Trunk/Postural Assessment :    Balance:   Exercises:   Other Treatments: Treatments Neuromuscular Facilitation: Right;Upper Extremity;Forced use;Activity to increase motor control;Activity to increase sustained activation;Lower Extremity;Activity to increase coordination;Activity to increase lateral weight shifting Weight Bearing Technique Weight Bearing Technique: Yes RUE Weight Bearing Technique: Extended arm seated;Other (comment) (side prop on R elbow seate EOM) Response to Weight Bearing Technique: Pt able to tolerate side prop [facility] for 30-40 seconds at a time while using LUE for reaching activity. Pt moved to prone and propped on B elbows, attempted to have pt push up to quadruped., but pt unable to tolerate WBing through R elbow in prone. In standing forced use w/ RLE w/ LLE  stepping to target, Max-TotalA to remain standing.   See FIM for current functional status  Therapy/Group: Individual Therapy  Hosie SpangleGodfrey, Sujey Gundry Hosie SpangleJess Gera Inboden, PT, DPT 12/06/2013, 7:50 AM
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
## 2013-12-06 NOTE — Progress Notes (Signed)
Social Work Patient ID: Bruce [name], male   DOB: [birth year], 33 y.o.   MRN: [id number]  Met yesterday with pt's sister to review team conference.  Sister pleased with report of gains made in mobility and expression.  She remains very supportive, however, simply cannot provide needed care for pt at d/c.  Discussed goals of min assist and better expression capabilities as goals to reach prior to transfer to SNF.  She is agreeable with these goals.  Will continue to follow for support, education and d/c plans.  Keirra Zeimet, LCSW
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
## 2013-12-06 NOTE — Progress Notes (Signed)
Speech Language Pathology Daily Session Note  Patient Details  Name: Bruce [name] MRN: [id number] Date of Birth: Nov 11, 1980  Today's Date: 12/06/2013 SLP Individual Time: Session 1:1300-1400; Session 2: 1500-1530 SLP Individual Time Calculation (min): Session 1:60 min; Session 2: 30 min  Short Term Goals: Week 2: SLP Short Term Goal 1 (Week 2): Patient will demonstrate sustained attention to a structured task for 2 minutes with Mod A multimodal cues.  SLP Short Term Goal 2 (Week 2): Patient will name functional items with Max A multimodal cues. SLP Short Term Goal 3 (Week 2): Patient will perfrom verbal, automatic tasks with Max A multimodal cues.  SLP Short Term Goal 4 (Week 2): Patient will perform oral motor exercises to reduce right side lingual/labial weakness with Max A multimodal cues. SLP Short Term Goal 5 (Week 2): Patient will consume Dys 2 textures with use of compensatory strategies with Min A multimodal cues.   Skilled Therapeutic Interventions:  Session 1: Pt was seen for skilled ST targeting cognitive-linguistic goals.  Upon arrival, pt was seated upright in wheelchair, awake, alert, and agreeable to participate in ST.  SLP facilitated the session with a structured naming task targeting improved functional communication for basic, daily information.  During the abovementioned task, pt benefited from max phonemic placement cues as well as a delayed model to name basic daily objects in pictures for ~60% accuracy.  Pt was also noted with improved naming accuracy when provided with a written aid. During structured drill practice for improved consistency and accuracy of verbal expression, pt produced vowels in isolation and in alternating combinations with max faded to min phonemic placement cues and visual feedback with use of mirror.  SLP also facilitated the session with a structured phrase repetition task with pt demonstrating telegraphic speech which eliminated functional,  non-content words such as articles of nouns.  Pt would benefit from further diagnostic treatment targeting awareness of verbal errors, with a written visual model to expand pt's syntactical structure as a goal of future treatment sessions.     Session 2: Pt was seen for skilled speech therapy targeting cognitive-linguistic goals.  SLP facilitated the session with structured naming practice within automatic verbal sequences.  Pt produced the numbers 1-10 with ~80% accuracy with mod faded to min assist verbal cues and a written visual aid.  SLP increased task complexity during practice reciting the days of the week with pt requiring overall mod-max assist phonemic placement cues and written aids.  Pt was also able to produce 1 functional phrase as well the name of his son over at least 3 consecutive trials with mod faded to min assist verbal cues, visual feedback with use of mirror,and written aids.  Continue per current plan of care.   FIM:  Comprehension Comprehension Mode: Auditory Comprehension: 4-Understands basic 75 - 89% of the time/requires cueing 10 - 24% of the time Expression Expression Mode: Verbal Expression: 2-Expresses basic 25 - 49% of the time/requires cueing 50 - 75% of the time. Uses single words/gestures. Social Interaction Social Interaction: 3-Interacts appropriately 50 - 74% of the time - May be physically or verbally inappropriate. Problem Solving Problem Solving: 2-Solves basic 25 - 49% of the time - needs direction more than half the time to initiate, plan or complete simple activities Memory Memory: 2-Recognizes or recalls 25 - 49% of the time/requires cueing 51 - 75% of the time  Pain Pain Assessment (session 1) Pain Assessment: No/denies pain Pain Assessment (session 2) Pain Assessment: No/denies pain  Therapy/Group:  Individual Therapy  Jackalyn LombardNicole Elijio Staples, M.A. CCC-SLP  Hisayo Delossantos, Melanee SpryNicole L 12/06/2013, 4:31 PM

## 2013-12-07 ENCOUNTER — Inpatient Hospital Stay (HOSPITAL_COMMUNITY): Payer: Medicaid Other | Admitting: Speech Pathology

## 2013-12-07 ENCOUNTER — Inpatient Hospital Stay (HOSPITAL_COMMUNITY): Payer: Medicaid Other

## 2013-12-07 ENCOUNTER — Inpatient Hospital Stay (HOSPITAL_COMMUNITY): Payer: Self-pay | Admitting: Speech Pathology

## 2013-12-07 ENCOUNTER — Inpatient Hospital Stay (HOSPITAL_COMMUNITY): Payer: Medicaid Other | Admitting: *Deleted

## 2013-12-07 NOTE — Progress Notes (Signed)
Occupational Therapy Session Note  Patient Details  Name: [name] MRN: [id number] Date of Birth: 09-16-1980  Today's Date: 12/07/2013 OT Individual Time: [id number] and 1345-1415 OT Individual Time Calculation (min): 75 min and 30 min     Short Term Goals: Week 2:  OT Short Term Goal 1 (Week 2): Pt will stand for 45 sec during self-care task with min assist OT Short Term Goal 2 (Week 2): Pt will complete toilet transfer with min assist OT Short Term Goal 3 (Week 2): Pt will wash 10/10 body parts with min cues for initiation and completion OT Short Term Goal 4 (Week 2): Pt will complete toilet task with mod assist  Skilled Therapeutic Interventions/Progress Updates:    Session 1: Pt seen for ADL retraining with focus on functional transfers, standing balance, and cognitive remediation. Pt received sitting in w/c eager for shower. Pt completed stand pivot transfer w/c<>shower chair with mod assist with use of grab bars. Pt required mod cues for problem solving during bathing (crossover technique, apply soap to wash cloth). Completed dressing with increased difficult d/t pt donning jeans and l/s shirt, requiring mod cues for hemi dressing technique. Completed sit<>stand during self-care tasks with mod-min assist. Pt completed oral care in standing while weight bearing throughout RUE with min assist standing balance. Pt verbalizing more self-care items with mod multimodal cues. Pt with sporadic, automatic 2-3 word phrases during therapy session. Pt left sitting in w/c with sister present and all needs in reach.    Session 2: Pt seen for 1:1 OT session with focus on RUE NMR, functional transfers, and cognitive remediation. Pt received sitting in w/c. Transferred to mat table with mod assist stand pivot. Engaged in weight bearing to RUE while in side lying propped on elbow with pt tolerating well for 30-45 seconds at a time. During weight bearing, pt reaching to point at pictures of family  members and identifying by name. Pt identified all family members with max multimodal cues. Transitioned to sitting EOM then therapist provided muscle tapping and PROM. No active movement noted in RUE. Pt transferred to w/c and left sitting with sister in room.   Therapy Documentation Precautions:  Precautions Precautions: Fall Precaution Comments: R hemi, expressive aphasia Restrictions Weight Bearing Restrictions: No Other Position/Activity Restrictions: no limitations given in chart regarding knee ROM or WB'ing, x-ray negative for bony abnormality General:   Vital Signs:   Pain: Pain Assessment Pain Assessment: No/denies pain Pain Score: 0-No pain  See FIM for current functional status  Therapy/Group: Individual Therapy  [place] Kyler, Vara GuardianKayla N 12/07/2013, 12:12 PM
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
## 2013-12-07 NOTE — Progress Notes (Signed)
Physical Therapy Session Note  Patient Details  Name: Bruce [name] MRN: [id number] Date of Birth: [birth year]  Today's Date: 12/07/2013 PT Individual Time: 0800-0900 PT Individual Time Calculation (min): 60 min   Short Term Goals: Week 2:  PT Short Term Goal 1 (Week 2): Patient will perform bed mobility on flat surface without bed rails and with minA. PT Short Term Goal 2 (Week 2): Patient will perform functional transfers with minA. PT Short Term Goal 3 (Week 2): Patient will perform gait training x50' with modA. PT Short Term Goal 4 (Week 2): Patient will perform wheelchair mobility x50' with modA. PT Short Term Goal 5 (Week 2): Patient willl negotiate 3 steps with one handrail and modA.  Skilled Therapeutic Interventions/Progress Updates:    Patient received sitting in wheelchair. Session focused on functional transfers, postural control in sitting and standing, and R LE/core NMR. See below for R LE NMR. Throughout session, patient instructed to count repititions out loud, min cues for 1-10, mod-max phonemic cues for 10-20. Sitting trunk lengthening/shortening exercise with emphasis on lengthening R side to place numbered cards under L glute. Sitting dynamic balance activity with emphasis on picking rings up off floor, reaching to the R across midline, and tossing rings; min-modA overall. Patient returned to room and left seated in wheelchair with seatbelt donned and all needs within reach.  Therapy Documentation Precautions:  Precautions Precautions: Fall Precaution Comments: R hemi, expressive aphasia Restrictions Weight Bearing Restrictions: No Other Position/Activity Restrictions: no limitations given in chart regarding knee ROM or WB'ing, x-ray negative for bony abnormality Pain: Pain Assessment Pain Assessment: No/denies pain Pain Score: 0-No pain Locomotion : Ambulation Ambulation/Gait Assistance: Not tested (comment)  Other Treatments: Treatments Neuromuscular  Facilitation: Right;Lower Extremity;Activity to increase motor control;Activity to increase sustained activation;Forced use (Standing L LE stair taps and standing L LE ball roll outs to facilitate increased weight shift to R side and increased weight beairng through R LE; modA overall for intermittent R knee buckling.)  See FIM for current functional status  Therapy/Group: Individual Therapy  Chipper HerbBridget S Rhema Boyett S. Haji Delaine, PT, DPT 12/07/2013, 11:58 AM

## 2013-12-07 NOTE — Plan of Care (Signed)
Problem: RH SAFETY Goal: RH STG ADHERE TO SAFETY PRECAUTIONS W/ASSISTANCE/DEVICE STG Adhere to Safety Precautions With Min Assistance/Device.  Outcome: Progressing No unsafe behavior noted

## 2013-12-07 NOTE — Progress Notes (Signed)
Speech Language Pathology Daily Session Note  Patient Details  Name: Bruce [name] XXXSmith MRN: [id number] Date of Birth: 10-26-1980  Today's Date: 12/07/2013 SLP Individual Time: [id number] SLP Individual Time Calculation (min): 45 min  Short Term Goals: Week 2: SLP Short Term Goal 1 (Week 2): Patient will demonstrate sustained attention to a structured task for 2 minutes with Mod A multimodal cues.  SLP Short Term Goal 2 (Week 2): Patient will name functional items with Max A multimodal cues. SLP Short Term Goal 3 (Week 2): Patient will perfrom verbal, automatic tasks with Max A multimodal cues.  SLP Short Term Goal 4 (Week 2): Patient will perform oral motor exercises to reduce right side lingual/labial weakness with Max A multimodal cues. SLP Short Term Goal 5 (Week 2): Patient will consume Dys 2 textures with use of compensatory strategies with Min A multimodal cues.   Skilled Therapeutic Interventions: Skilled treatment session focused on speech goals and overall functional communication. SLP facilitated the session by providing pictures of functional items. Patient was able to accurately name these items with 83% accuracy with written aid and moderate phonemic and sentence completion cues.  Patient also demonstrated increased ability to self-correct verbal errors with extra time. Patient independently requested to use the bathroom with the use of gestures and yes/no questions. Patient left in chair with quick release belt in place and all needs within reach. Continue with current plan of care.    FIM:  Comprehension Comprehension Mode: Auditory Comprehension: 4-Understands basic 75 - 89% of the time/requires cueing 10 - 24% of the time Expression Expression Mode: Verbal Expression: 2-Expresses basic 25 - 49% of the time/requires cueing 50 - 75% of the time. Uses single words/gestures. Social Interaction Social Interaction: 3-Interacts appropriately 50 - 74% of the time - May be physically  or verbally inappropriate. Problem Solving Problem Solving: 3-Solves basic 50 - 74% of the time/requires cueing 25 - 49% of the time Memory Memory: 3-Recognizes or recalls 50 - 74% of the time/requires cueing 25 - 49% of the time  Pain Pain Assessment Pain Assessment: No/denies pain Pain Score: 0-No pain  Therapy/Group: Individual Therapy  Damarko Stitely 12/07/2013, 12:27 PM

## 2013-12-07 NOTE — Progress Notes (Signed)
Speech Language Pathology Daily Session Note  Patient Details  Name: Bruce [name] MRN: [id number] Date of Birth: 1980/11/14  Today's Date: 12/07/2013 SLP Individual Time: 1431-1531 SLP Individual Time Calculation (min): 60 min  Short Term Goals: Week 2: SLP Short Term Goal 1 (Week 2): Patient will demonstrate sustained attention to a structured task for 2 minutes with Mod A multimodal cues.  SLP Short Term Goal 2 (Week 2): Patient will name functional items with Max A multimodal cues. SLP Short Term Goal 3 (Week 2): Patient will perfrom verbal, automatic tasks with Max A multimodal cues.  SLP Short Term Goal 4 (Week 2): Patient will perform oral motor exercises to reduce right side lingual/labial weakness with Max A multimodal cues. SLP Short Term Goal 5 (Week 2): Patient will consume Dys 2 textures with use of compensatory strategies with Min A multimodal cues.   Skilled Therapeutic Interventions:  Pt was seen for skilled ST targeting cognitive-linguistic goals. Upon arrival, pt was upright in wheelchair, awake, alert, and agreeable to participate in ST.  [name] was also present at the beginning of today's therapy session.  Pt was able to produce his sister's name, his niece's name, and his son's name with mod assist phonemic placement cues for 100% accuracy.  SLP facilitated the session with structured naming practice.  Pt required overall mod assist sentence completion and articulatory cues to name basic household objects in pictures for ~80% accuracy. Articulatory errors were characterized most consistently by omission, substitutions, fronting, and cluster reductions. SLP noted pt with increased attempts to initiate communication in comparison to previous therapy sessions with improving awareness of verbal errors at the phrase level.  SLP encouraged pt to continue attempts at functional communication via supported conversation techniques including verifying communicative intent with  question cues.  Pt responded to the abovementioned question with yes/no's; however, he still struggled to fully convey needs/wants out of context.  Continue per current plan of care.   FIM:  Comprehension Comprehension Mode: Auditory Comprehension: 4-Understands basic 75 - 89% of the time/requires cueing 10 - 24% of the time Expression Expression Mode: Verbal Expression: 3-Expresses basic 50 - 74% of the time/requires cueing 25 - 50% of the time. Needs to repeat parts of sentences. Social Interaction Social Interaction: 3-Interacts appropriately 50 - 74% of the time - May be physically or verbally inappropriate. Problem Solving Problem Solving: 3-Solves basic 50 - 74% of the time/requires cueing 25 - 49% of the time Memory Memory: 3-Recognizes or recalls 50 - 74% of the time/requires cueing 25 - 49% of the time  Pain Pain Assessment Pain Assessment: No/denies pain  Therapy/Group: Individual Therapy  Bruce [name], M.A. CCC-SLP  Bruce [name], Bruce [name] 12/07/2013, 7:13 PM

## 2013-12-08 ENCOUNTER — Inpatient Hospital Stay (HOSPITAL_COMMUNITY): Payer: Medicaid Other | Admitting: *Deleted

## 2013-12-08 ENCOUNTER — Inpatient Hospital Stay (HOSPITAL_COMMUNITY): Payer: Self-pay | Admitting: Occupational Therapy

## 2013-12-08 ENCOUNTER — Inpatient Hospital Stay (HOSPITAL_COMMUNITY): Payer: Medicaid Other | Admitting: Speech Pathology

## 2013-12-08 ENCOUNTER — Inpatient Hospital Stay (HOSPITAL_COMMUNITY): Payer: Medicaid Other

## 2013-12-08 NOTE — Progress Notes (Signed)
Occupational Therapy Session Note  Patient Details  Name: Bruce [name] MRN: [id number] Date of Birth: 1980-10-08  Today's Date: 12/08/2013 OT Individual Time: 1500-1600 OT Individual Time Calculation (min): 60 min    Short Term Goals: Week 2:  OT Short Term Goal 1 (Week 2): Pt will stand for 45 sec during self-care task with min assist OT Short Term Goal 1 - Progress (Week 2): Met OT Short Term Goal 2 (Week 2): Pt will complete toilet transfer with min assist OT Short Term Goal 2 - Progress (Week 2): Progressing toward goal OT Short Term Goal 3 (Week 2): Pt will wash 10/10 body parts with min cues for initiation and completion OT Short Term Goal 3 - Progress (Week 2): Progressing toward goal OT Short Term Goal 4 (Week 2): Pt will complete toilet task with mod assist OT Short Term Goal 4 - Progress (Week 2): Met OT Short Term Goal 5 - Progress (Week 2): Met  Skilled Therapeutic Interventions/Progress Updates:  Upon entering the room, pt seated in wheelchair with R UE arm on arm tray. Pt with no c/o pain this session. Pt oriented to self, location, situation, and time. Pt also able to state therapist name at end of session after this being the second time since rehab stay working with him. OT assisted pt to day room where pt stood for 2 bouts of 11 minutes and 9 minutes at table top in R UE weight bearing position. Pt engaging in standing while playing UNO with therapist. Pt min requiring verbal cues and tactile cues for weight shift to the R in standing. Standing balance was steady assist to Mod A pending pt attention to task. Pt engaging in problem solving of activity with no verbal cues for turn taking and min verbal questioning cues for participation on what card to use. Therapist provided PROM to R UE in all planes x 10 reps each. No trace movement detected but pt tolerated PROM exercises well. Pt showed safety awareness of R UE by picking it up to place back on arm tray at end of  session. Verbal cues to lock breaks for standing. Pt propelled wheelchair back to room with L UE and L LE and min A from therapist. Pt seated in wheelchair with call bell and all other items within reach awaiting next therapist.   Therapy Documentation Precautions:  Precautions Precautions: Fall Precaution Comments: R hemi, expressive aphasia Restrictions Weight Bearing Restrictions: No Other Position/Activity Restrictions: no limitations given in chart regarding knee ROM or WB'ing, x-ray negative for bony abnormality General:   Vital Signs: Therapy Vitals Temp: 97.1 F (36.2 C) Temp Source: Oral Pulse Rate: 71 Resp: 17 BP: 105/64 mmHg Patient Position (if appropriate): Sitting Oxygen Therapy SpO2: 99 % O2 Device: Not Delivered Pain: Pain Assessment Pain Assessment: No/denies pain ADL: ADL ADL Comments: see FIM Exercises:   Other Treatments:    See FIM for current functional status  Therapy/Group: Individual Therapy  Phineas Semen 12/08/2013, 5:12 PM

## 2013-12-08 NOTE — Progress Notes (Signed)
Speech Language Pathology Weekly Progress and Session Note  Patient Details  Name: Bruce [name] MRN: [id number] Date of Birth: 1981-01-08  Beginning of progress report period: December 01, 2013 End of progress report period: December 08, 2013  Today's Date: 12/08/2013 SLP Individual Time: 7026-3785 SLP Individual Time Calculation (min): 45 min  Short Term Goals: Week 2: SLP Short Term Goal 1 (Week 2): Patient will demonstrate sustained attention to a structured task for 2 minutes with Mod A multimodal cues.  SLP Short Term Goal 1 - Progress (Week 2): Met SLP Short Term Goal 2 (Week 2): Patient will name functional items with Max A multimodal cues. SLP Short Term Goal 2 - Progress (Week 2): Met SLP Short Term Goal 3 (Week 2): Patient will perfrom verbal, automatic tasks with Max A multimodal cues.  SLP Short Term Goal 3 - Progress (Week 2): Met SLP Short Term Goal 4 (Week 2): Patient will perform oral motor exercises to reduce right side lingual/labial weakness with Max A multimodal cues. SLP Short Term Goal 4 - Progress (Week 2): Discontinued (comment) SLP Short Term Goal 5 (Week 2): Patient will consume Dys 2 textures with use of compensatory strategies with Min A multimodal cues.  SLP Short Term Goal 5 - Progress (Week 2): Met    New Short Term Goals: Week 3: SLP Short Term Goal 1 (Week 3): Patient will demonstrate selective attention to a structured task for 45 minutes with Min A multimodal cues.  SLP Short Term Goal 2 (Week 3): Patient will name functional items with Min A multimodal cues. SLP Short Term Goal 3 (Week 3): Patient will perfrom verbal, automatic tasks with Min A multimodal cues.  SLP Short Term Goal 4 (Week 3): Patient will self-monitor and correct verbal errors with Mod A multimodal cues.  SLP Short Term Goal 5 (Week 3): Patient will consume Dys 2 textures with use of compensatory strategies with Min A multimodal cues.  SLP Short Term Goal 6 (Week 3): Patient will  demonstrate efficient mastication with trials of Dys. 3 textures with minimal buccal pocketing with supervision multimodal cues.   Weekly Progress Updates: Patient has made functional gains and has met 4 out of 5 short term goals this reporting period due to improved attention, ability to name functional items with multimodal cues, ability to complete automatic verbal sequences and swallowing function. Oral-Motor goal was not addressed during this reporting period and has been discontinued. Currently, patient is consuming Dys. 2 textures with minimal overt s/s of aspiration but requires supervision multimodal cues to utilize swallowing compensatory strategies. Patient also continues to require Mod assist to perform functional and familiar cognitive tasks safely and overall Mod assist for verbal expression in regards to naming tasks and completing automatic verbal sequences with increased ability to self-monitor and correct errors.  Patient and family education is ongoing. As a result, patient would benefit from continued skilled SLP intervention to maximize cognitive-linguistic abilities and overall swallow function in order to maximize his functional independence prior to discharge with 24/7 Supervision.   Intensity: Minumum of 1-2 x/day, 30 to 90 minutes Frequency: 5 out of 7 days Duration/Length of Stay: TBD due to possbile SNF placement  Treatment/Interventions: Functional tasks;Patient/family education;Dysphagia/aspiration precaution training;Cueing hierarchy;Cognitive remediation/compensation;Environmental controls;Internal/external aids;Multimodal communication approach;Speech/Language facilitation;Therapeutic Activities   Daily Session  Skilled Therapeutic Interventions: Skilled treatment session focused on dysphagia and cognitive-linguistic goals. Upon arrival, patient was sitting upright in his wheelchair and was consuming his breakfast meal of Dys. 2 textures with thin liquids.  Patient  required supervision multimodal cues to self-monitor and correct right buccal pocketing and to utilize small bites/sips. Patient demonstrated intermittent throat clearing X 3, suspect due to talking to this clincian with a full oral cavity.  SLP noted pt with increased attempts to initiate communication at the phrase level in comparison to previous therapy sessions with improving awareness of verbal errors and increased attempts to self-coorect. SLP encouraged pt to continue attempts at functional communication via supported conversation techniques including verifying communicative intent with question cues. Patient eventually able to convey his wants/needs but his articulatory errors were characterized most consistently by omissions and substitutions. Patient left in wheelchair with quick release belt in place and all needs within reach. Continue with current plan of care.  FIM:  Comprehension Comprehension Mode: Auditory Comprehension: 4-Understands basic 75 - 89% of the time/requires cueing 10 - 24% of the time Expression Expression Mode: Verbal Expression: 3-Expresses basic 50 - 74% of the time/requires cueing 25 - 50% of the time. Needs to repeat parts of sentences. Social Interaction Social Interaction: 3-Interacts appropriately 50 - 74% of the time - May be physically or verbally inappropriate. Problem Solving Problem Solving: 3-Solves basic 50 - 74% of the time/requires cueing 25 - 49% of the time Memory Memory: 3-Recognizes or recalls 50 - 74% of the time/requires cueing 25 - 49% of the time FIM - Eating Eating Activity: 5: Supervision/cues;5: Set-up assist for open containers Pain No/Denies Pain   Therapy/Group: Individual Therapy  Laconda Basich, [place] 12/08/2013, 9:45 AM

## 2013-12-08 NOTE — Plan of Care (Signed)
Problem: RH Bathing Goal: LTG Patient will bathe with assist, cues/equipment (OT) LTG: Patient will bathe specified number of body parts with assist with/without cues using equipment (position) (OT)  Downgraded to min assist due to slow progress in therapy.  Problem: RH Dressing Goal: LTG Patient will perform upper body dressing (OT) LTG Patient will perform upper body dressing with assist, with/without cues (OT).  Downgraded to min assist due to slow progress in therapy.     Problem: RH Toilet Transfers Goal: LTG Patient will perform toilet transfers w/assist (OT) LTG: Patient will perform toilet transfers with assist, with/without cues using equipment (OT)  Downgraded to min assist due to slow progress in therapy.

## 2013-12-08 NOTE — Plan of Care (Signed)
Problem: RH Bed Mobility Goal: LTG Patient will perform bed mobility with assist (PT) LTG: Patient will perform bed mobility with assistance, with/without cues (PT).  Goal downgraded secondary to slow progress.  Problem: RH Bed to Chair Transfers Goal: LTG Patient will perform bed/chair transfers w/assist (PT) LTG: Patient will perform bed/chair transfers with assistance, with/without cues (PT).  Goal downgraded secondary to slow progress.

## 2013-12-08 NOTE — Progress Notes (Signed)
Occupational Therapy Weekly Progress Note  Patient Details  Name: Bruce [name] MRN: [id number] Date of Birth: 08-Jun-1980  Beginning of progress report period: December 01, 2013 End of progress report period: December 08, 2013  Today's Date: 12/08/2013 OT Individual Time: 0730-0830 OT Individual Time Calculation (min): 60 min    Patient has met 2 of 4 short term goals.  Patient has made good progress during this reporting period. Patient consistently requires mod assist functional transfers, with occasional min assist when transferring to left side. Patient requires min-mod assist for standing balance during self care task with total assist for optimal positioning of RUE. Patient is demonstrating improved recall and problem solving during self-care tasks with use of hemi dressing technique with mod cues. Patient with no active movement in RUE at this time however tolerates weight bearing and PROM well. Patient demonstrates increased awareness of RUE/RLE during functional transfers and self-care tasks with supervision cues. Patient's biggest barriers at this time are cognitive-linguistic deficits, specifically expressive aphasia and sustained attention. Patient is currently demonstrating behaviors consistent with [place] Level VI.   Patient continues to demonstrate the following deficits: R hemiplegia, decreased sustained attention, cognitive-linguistic deficits, decreased balance, decreased strength, decreased coordination, decreased postural control, decreased awareness, decreased activity tolerance, decreased functional use RUE/RLE and therefore will continue to benefit from skilled OT intervention to enhance overall performance with BADLs, balance, activity tolerance, attention, cognition, and ability to compensate for deficits.  Patient not progressing toward long term goals.  See goal revision..  Plan of care revisions: Downgraded to min assist overall.  OT Short Term Goals Week 2:  OT Short  Term Goal 1 (Week 2): Pt will stand for 45 sec during self-care task with min assist OT Short Term Goal 1 - Progress (Week 2): Met OT Short Term Goal 2 (Week 2): Pt will complete toilet transfer with min assist OT Short Term Goal 2 - Progress (Week 2): Progressing toward goal OT Short Term Goal 3 (Week 2): Pt will wash 10/10 body parts with min cues for initiation and completion OT Short Term Goal 3 - Progress (Week 2): Progressing toward goal OT Short Term Goal 4 (Week 2): Pt will complete toilet task with mod assist OT Short Term Goal 4 - Progress (Week 2): Met OT Short Term Goal 5 - Progress (Week 2): Met Week 3:  OT Short Term Goal 1 (Week 3): Pt will complete UB dressing with min assist and min cues for problem solving  OT Short Term Goal 2 (Week 3): Pt will complete toilet task with min assist  OT Short Term Goal 3 (Week 3): Pt will complete bathing with min assist and supervision cues for sequencing and problem solving OT Short Term Goal 4 (Week 3): Pt will demonstrate sustained attention to self-care task for 2 min with min cues  Skilled Therapeutic Interventions/Progress Updates:    Pt seen for ADL retraining with focus on functional transfers, standing balance, and cognitive remediation. Pt received supine in bed. Pt completed stand pivot transfers bed>w/c with mod assist then stood at toilet with min assist to urinate. Transferred w/c<>shower chair with mod assist with use of grab bars. Pt required mod cues for problem solving during bathing (crossover technique, apply soap to wash cloth). Pt required mod cues for sustained attention as he was perseverating on floor being dirty (towel was placed to avoid pt's contact with floor). Completed dressing with mod assist and max cues for problem solving. Completed sit<>stand during self-care tasks with mod-min assist.  Pt demonstrating improved ability to identify items and request needs using gestures and sentence completion cues. Pt left sitting  in w/c with all needs in reach.   Therapy Documentation Precautions:  Precautions Precautions: Fall Precaution Comments: R hemi, expressive aphasia Restrictions Weight Bearing Restrictions: No Other Position/Activity Restrictions: no limitations given in chart regarding knee ROM or WB'ing, x-ray negative for bony abnormality General:   Vital Signs: Therapy Vitals Temp: 98.2 F (36.8 C) Temp Source: Oral Pulse Rate: 57 Resp: 20 BP: 112/75 mmHg Oxygen Therapy SpO2: 100 % O2 Device: Not Delivered Pain: No report of pain during therapy session.   See FIM for current functional status  Therapy/Group: Individual Therapy  Duayne Cal 12/08/2013, 7:26 AM
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
## 2013-12-08 NOTE — Progress Notes (Signed)
Physical Therapy Weekly Progress Note  Patient Details  Name: Bruce [name] MRN: [id number] Date of Birth: March 19, 1980  Beginning of progress report period: December 01, 2013 End of progress report period: December 08, 2013  Today's Date: 12/08/2013 PT Individual Time: 1600-1700 PT Individual Time Calculation (min): 60 min   Patient has met 2 of 5 short term goals.  Patient has made good progress during first reporting period on rehab. Patient is currently functioning at [facility] level overall for bed mobility, modA for functional transfers and stair negotiation, maxA for gait, and standing balance. Patient's greatest limitations are related to cognitive-linguistic deficits, specifically his expressive language, as well as sustained attention. Patient is currently demonstrating behaviors consistent with [place] Level VI.   Patient continues to demonstrate the following deficits: R hemiplegia, decreased balance, postural control, ability to compensate for deficits, functional use of R side, sustained attention, awareness, coordination and therefore will continue to benefit from skilled PT intervention to enhance overall performance with activity tolerance, balance, postural control, ability to compensate for deficits, functional use of  right upper extremity and right lower extremity, attention, awareness and coordination.  Patient not progressing toward long term goals.  See goal revision..  Plan of care revisions: Goals downgraded to minA overall at wheelchair level; modA gait and stair goals maintained..  PT Short Term Goals Week 1:  PT Short Term Goal 1 (Week 1): Pt will perform supine<>sit with min A with HOB flat using bed rail. PT Short Term Goal 1 - Progress (Week 1): Progressing toward goal PT Short Term Goal 2 (Week 1): Pt will transfer from bed<>chair (to both R and L sides) with min A and min cueing. PT Short Term Goal 2 - Progress (Week 1): Progressing toward goal PT Short Term Goal 3  (Week 1): Pt will peform static standing x30 seconds with mod A of single therapist. PT Short Term Goal 3 - Progress (Week 1): Met PT Short Term Goal 4 (Week 1): Pt will perform w/c mobility x50' with mod A and 50% cueing for attention to R side. PT Short Term Goal 4 - Progress (Week 1): Progressing toward goal PT Short Term Goal 5 (Week 1): Pt will perform gait x30' with +2Total A. PT Short Term Goal 5 - Progress (Week 1): Met Week 2:  PT Short Term Goal 1 (Week 2): Patient will perform bed mobility on flat surface without bed rails and with minA. PT Short Term Goal 1 - Progress (Week 2): Met PT Short Term Goal 2 (Week 2): Patient will perform functional transfers with minA. PT Short Term Goal 2 - Progress (Week 2): Progressing toward goal PT Short Term Goal 3 (Week 2): Patient will perform gait training x50' with modA. PT Short Term Goal 3 - Progress (Week 2): Progressing toward goal PT Short Term Goal 4 (Week 2): Patient will perform wheelchair mobility x50' with modA. PT Short Term Goal 4 - Progress (Week 2): Met PT Short Term Goal 5 (Week 2): Patient willl negotiate 3 steps with one handrail and modA. PT Short Term Goal 5 - Progress (Week 2): Progressing toward goal Week 3:  PT Short Term Goal 1 (Week 3): STGs=LTGs  Skilled Therapeutic Interventions/Progress Updates:    Patient received sitting in wheelchair. Session focused on wheelchair mobility, functional transfers, and standing balance. Patient still does not have shoes, so wheelchair cushion removed to improved ability to utilize L LE to propel wheelchair. Wheelchair mobility 200' x1in controlled environment and 97' x1 on carpeted surface  with L UE/LE and supervision/cues for obstacle negotiation. Tandem standing with L LE positioned on box while patient completes pipe tree activity to facilitate increased weight bearing through R LE, requires mod-maxA for standing balance, no cues for pipe tree activity.  Functional transfers  wheelchair<>chair with mod-maxA (mod for squat pivot, max for stand pivot due to poor movement/placement of R LE). Patient requires increased time, but able to communicate that he wants to remove the brief he is wearing and put on boxers. Patient returned to room and performed several sit<>stands at sink with modA to doff shorts and brief and don boxers and shorts.  Patient returned to room and left seated in wheelchair with seatbelt donned and all needs within reach.  Therapy Documentation Precautions:  Precautions Precautions: Fall Precaution Comments: R hemi, expressive aphasia Restrictions Weight Bearing Restrictions: No Other Position/Activity Restrictions: no limitations given in chart regarding knee ROM or WB'ing, x-ray negative for bony abnormality Pain: Pain Assessment Pain Assessment: No/denies pain Locomotion : Ambulation Ambulation/Gait Assistance: Not tested (comment) Wheelchair Mobility Distance: 200   See FIM for current functional status  Therapy/Group: Individual Therapy  Lillia Abed. Destinie Thornsberry, PT, DPT 12/08/2013, 5:05 PM

## 2013-12-08 NOTE — Progress Notes (Signed)
Speech Language Pathology Daily Session Note  Patient Details  Name: Bruce [name] MRN: [id number] Date of Birth: 09-25-1980  Today's Date: 12/08/2013 SLP Individual Time: 1345-1430 SLP Individual Time Calculation (min): 45 min  Short Term Goals: Week 3: SLP Short Term Goal 1 (Week 3): Patient will demonstrate selective attention to a structured task for 45 minutes with Min A multimodal cues.  SLP Short Term Goal 2 (Week 3): Patient will name functional items with Min A multimodal cues. SLP Short Term Goal 3 (Week 3): Patient will perfrom verbal, automatic tasks with Min A multimodal cues.  SLP Short Term Goal 4 (Week 3): Patient will self-monitor and correct verbal errors with Mod A multimodal cues.  SLP Short Term Goal 5 (Week 3): Patient will consume Dys 2 textures with use of compensatory strategies with Min A multimodal cues.  SLP Short Term Goal 6 (Week 3): Patient will demonstrate efficient mastication with trials of Dys. 3 textures with minimal buccal pocketing with supervision multimodal cues.   Skilled Therapeutic Interventions: Skilled treatment session focused on cognitive-linguistic goals. Upon arrival, patient was sitting upright in his wheelchair but appeared anxious. Patient reported he was "bored" and did not like that he had to sit in his wheelchair in his room "all day." Patient appeared anxious throughout the remainder of the session and was difficult to redirect. Patient preservative on asking questions in regards to his current condition and "how long" the recovery process will take.  Patient required Max A multimodal cues to name functional items and to generative a verbal response in regards to the function of the item.  Patient left in room with quick release belt in place and all needs within reach. Continue with current plan of care.    FIM:  Comprehension Comprehension Mode: Auditory Comprehension: 4-Understands basic 75 - 89% of the time/requires cueing 10 - 24%  of the time Expression Expression Mode: Verbal Expression: 3-Expresses basic 50 - 74% of the time/requires cueing 25 - 50% of the time. Needs to repeat parts of sentences. Social Interaction Social Interaction: 3-Interacts appropriately 50 - 74% of the time - May be physically or verbally inappropriate. Problem Solving Problem Solving: 3-Solves basic 50 - 74% of the time/requires cueing 25 - 49% of the time Memory Memory: 3-Recognizes or recalls 50 - 74% of the time/requires cueing 25 - 49% of the time  Pain Pain Assessment Pain Assessment: No/denies pain  Therapy/Group: Individual Therapy  Sherece Gambrill 12/08/2013, 4:13 PM

## 2013-12-09 ENCOUNTER — Inpatient Hospital Stay (HOSPITAL_COMMUNITY): Payer: Medicaid Other | Admitting: Physical Therapy

## 2013-12-09 ENCOUNTER — Inpatient Hospital Stay (HOSPITAL_COMMUNITY): Payer: Medicaid Other

## 2013-12-09 NOTE — Progress Notes (Signed)
Occupational Therapy Session Note  Patient Details  Name: Bruce [name] MRN: [id number] Date of Birth: 16-Nov-1980  Today's Date: 12/09/2013 OT Individual Time: 0865-7846 OT Individual Time Calculation (min): 32 min    Short Term Goals: Week 2:  OT Short Term Goal 1 (Week 2): Pt will stand for 45 sec during self-care task with min assist OT Short Term Goal 1 - Progress (Week 2): Met OT Short Term Goal 2 (Week 2): Pt will complete toilet transfer with min assist OT Short Term Goal 2 - Progress (Week 2): Progressing toward goal OT Short Term Goal 3 (Week 2): Pt will wash 10/10 body parts with min cues for initiation and completion OT Short Term Goal 3 - Progress (Week 2): Progressing toward goal OT Short Term Goal 4 (Week 2): Pt will complete toilet task with mod assist OT Short Term Goal 4 - Progress (Week 2): Met OT Short Term Goal 5 - Progress (Week 2): Met  Skilled Therapeutic Interventions/Progress Updates:    Pt seen for 1:1 OT session with focus on postural control in standing, cognitive remediation, and RUE NMR. Pt received sitting in w/c. Propelled self in w/c to day room with use of LUE/LLE. Engaged in card game of BLINK while standing and weight bearing through [place]. Positioned cards to right side to facilitate weight shift. Pt required min-max assist, mod cues and manual facilitation for postural control and standing balance. Pt demonstrated good problem solving skills during game, requiring min cues to complete. Pt required mod cues for sustained attention to task. Pt returned to room and left sitting in w/c with all needs in reach.   Therapy Documentation Precautions:  Precautions Precautions: Fall Precaution Comments: R hemi, expressive aphasia Restrictions Weight Bearing Restrictions: No Other Position/Activity Restrictions: no limitations given in chart regarding knee ROM or WB'ing, x-ray negative for bony abnormality General:   Vital Signs:   Pain: No report of  pain during therapy session.  See FIM for current functional status  Therapy/Group: Individual Therapy  Duayne Cal 12/09/2013, 1:54 PM
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
## 2013-12-09 NOTE — Progress Notes (Signed)
Physical Therapy Session Note  Patient Details  Name: Bruce [name] XXXSmith MRN: [id number] Date of Birth: September 16, 1980  Today's Date: 12/09/2013 PT Individual Time: 0935-1005 PT Individual Time Calculation (min): 30 min   Short Term Goals: Week 3:  PT Short Term Goal 1 (Week 3): STGs=LTGs  Skilled Therapeutic Interventions/Progress Updates:    Therapeutic Activity: Pt received in bed with alarm on, agreeable to PT session, friendly but using curse words when unable to come up with correct words throughout session. Pt requested to put clothes on prior to leaving room. PT instructs pt in donning pants and socks req max A for lower body dressing. PT instructs pt in supine to sit transfer from flat bed without rails req min A to the R. PT instructs pt in donning shirt edge of bed req mod A for help and min-mod A for dynamic sitting balance, especially with vision occluded.  PT instructs pt in stand-step transfer to the L from bed to w/c req min A.   Gait Training: PT instructs pt in ambulation with L wall rail x 30' req min A for R LE progression and balance - pt tends to lean body on L wall rail and sporadically lets go of rail to adjust pants.  PT instructs pt in ambulation with L HW x 20' with 2nd assist for w/c follow, req mod A for balance and verbal cues to hold walker.  PT stabilizes pt's R knee during stance phase, preventing hyperextension of the knee (which is tendency to happen during stance for stability and due to pt decreased motor control of R knee), as well as progressing R LE during swing phase due to hemiplegia and inability for pt to activate leg to swing it, except for passive structures pulling R leg forward during R terminal stance phase.   Pt very agreeable to therapy session. Sustained attention to task impaired during session, but PT able to redirect focus to task easily. Continue per PT POC.   Therapy Documentation Precautions:  Precautions Precautions: Fall Precaution  Comments: R hemi, expressive aphasia Restrictions Weight Bearing Restrictions: No Other Position/Activity Restrictions: no limitations given in chart regarding knee ROM or WB'ing, x-ray negative for bony abnormality Pain: Pain Assessment Pain Assessment: 0-10 Pain Score: 4  Pain Type: Acute pain Pain Location: Leg Pain Orientation: Right Pain Descriptors / Indicators: Aching Pain Onset: On-going Pain Intervention(s): Repositioned Multiple Pain Sites: No Locomotion : Ambulation Ambulation/Gait Assistance: 1: +2 Total assist   See FIM for current functional status  Therapy/Group: Individual Therapy  [name] 12/09/2013, 10:14 AM

## 2013-12-09 NOTE — Progress Notes (Signed)
Physical Therapy Session Note  Patient Details  Name: Bruce [name] MRN: [id number] Date of Birth: 07-23-1980  Today's Date: 12/09/2013 PT Individual Time: 4403-4742 PT Individual Time Calculation (min): 30 min   Short Term Goals: Week 1:  PT Short Term Goal 1 (Week 1): Pt will perform supine<>sit with min A with HOB flat using bed rail. PT Short Term Goal 1 - Progress (Week 1): Progressing toward goal PT Short Term Goal 2 (Week 1): Pt will transfer from bed<>chair (to both R and L sides) with min A and min cueing. PT Short Term Goal 2 - Progress (Week 1): Progressing toward goal PT Short Term Goal 3 (Week 1): Pt will peform static standing x30 seconds with mod A of single therapist. PT Short Term Goal 3 - Progress (Week 1): Met PT Short Term Goal 4 (Week 1): Pt will perform w/c mobility x50' with mod A and 50% cueing for attention to R side. PT Short Term Goal 4 - Progress (Week 1): Progressing toward goal PT Short Term Goal 5 (Week 1): Pt will perform gait x30' with +2Total A. PT Short Term Goal 5 - Progress (Week 1): Met  Skilled Therapeutic Interventions/Progress Updates:  Pt was seen bedside in the pm. Pt transferred supine to edge of bed with mod A. Pt tolerated edge of bed about 20 minutes with S and L UE support. Pt transferred sit to stand with hemi walker and min to mod A. Pt ambulated with hemi walker and max A, 15 feet x 3 with physical assist required to advance R LE and verbal cues for technique. Pt transferred edge of bed to supine with mod A.   Therapy Documentation Precautions:  Precautions Precautions: Fall Precaution Comments: R hemi, expressive aphasia Restrictions Weight Bearing Restrictions: No Other Position/Activity Restrictions: no limitations given in chart regarding knee ROM or WB'ing, x-ray negative for bony abnormality General:   Pain: No c/o pain.    Locomotion : Ambulation Ambulation/Gait Assistance: 2: Max assist   See FIM for current  functional status  Therapy/Group: Individual Therapy  Dub Amis 12/09/2013, 3:34 PM

## 2013-12-10 ENCOUNTER — Inpatient Hospital Stay (HOSPITAL_COMMUNITY): Payer: Medicaid Other | Admitting: Physical Therapy
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
## 2013-12-10 NOTE — Progress Notes (Signed)
Physical Therapy Session Note  Patient Details  Name: Bruce [name] MRN: [id number] Date of Birth: 09-05-1980  Today's Date: 12/10/2013 PT Individual Time: 0900-1000 PT Individual Time Calculation (min): 60 min   Short Term Goals: Week 1:  PT Short Term Goal 1 (Week 1): Pt will perform supine<>sit with min A with HOB flat using bed rail. PT Short Term Goal 1 - Progress (Week 1): Progressing toward goal PT Short Term Goal 2 (Week 1): Pt will transfer from bed<>chair (to both R and L sides) with min A and min cueing. PT Short Term Goal 2 - Progress (Week 1): Progressing toward goal PT Short Term Goal 3 (Week 1): Pt will peform static standing x30 seconds with mod A of single therapist. PT Short Term Goal 3 - Progress (Week 1): Met PT Short Term Goal 4 (Week 1): Pt will perform w/c mobility x50' with mod A and 50% cueing for attention to R side. PT Short Term Goal 4 - Progress (Week 1): Progressing toward goal PT Short Term Goal 5 (Week 1): Pt will perform gait x30' with +2Total A. PT Short Term Goal 5 - Progress (Week 1): Met  Skilled Therapeutic Interventions/Progress Updates:  Pt was seen bedside in the am. Pt transferred supine to edge of bed with side rail and mod A. Pt transferred edge of bed to w/c to R side with mod A and verbal cues. Pt propelled w/c with L UE and LE about 125 feet with min A and verbal cues. Pt performed multiple sit to stand with hemiwalker and min to mod A. Pt ambulated with hemiwalker about 25 feet with mod to max A and second person to follow with w/c. Pt ambulated with hemiwalker, 50 feet x 2 with mod A and verbal cues for sequencing. Pt requires physical assist to advance R LE.   Therapy Documentation Precautions:  Precautions Precautions: Fall Precaution Comments: R hemi, expressive aphasia Restrictions Weight Bearing Restrictions: No Other Position/Activity Restrictions: no limitations given in chart regarding knee ROM or WB'ing, x-ray negative for  bony abnormality General:   Pain: No c/o pain.    Locomotion : Ambulation Ambulation/Gait Assistance: 3: Mod assist   See FIM for current functional status  Therapy/Group: Individual Therapy  Dub Amis 12/10/2013, 12:49 PM

## 2013-12-11 ENCOUNTER — Encounter (HOSPITAL_COMMUNITY): Payer: Self-pay

## 2013-12-11 ENCOUNTER — Inpatient Hospital Stay (HOSPITAL_COMMUNITY): Payer: Medicaid Other | Admitting: *Deleted

## 2013-12-11 ENCOUNTER — Inpatient Hospital Stay (HOSPITAL_COMMUNITY): Payer: Medicaid Other | Admitting: Speech Pathology

## 2013-12-11 ENCOUNTER — Inpatient Hospital Stay (HOSPITAL_COMMUNITY): Payer: Medicaid Other

## 2013-12-11 NOTE — Progress Notes (Signed)
Physical Therapy Session Note  Patient Details  Name: Bruce [name] MRN: [id number] Date of Birth: 07/18/1980  Today's Date: 12/11/2013 PT Individual Time: 0900-1000 PT Individual Time Calculation (min): 60 min   Short Term Goals: Week 3:  PT Short Term Goal 1 (Week 3): STGs=LTGs  Skilled Therapeutic Interventions/Progress Updates:    Patient received sitting in wheelchair. Session focused on wheelchair mobility, gait training, stair negotiation, and functional transfers. Patient performed wheelchair mobility 150' x1 with L UE/LE and supervision/verbal cues for obstacle/people negotiation. R DF ace wrap donned for remainder of session. Gait training 5735' x1 with hemi walker and modA. Noted improved tone in R hamstrings, improved ability to maintain contact with floor during stance phase. Additionally, patient without any instances of knee buckling with weight bearing. Patient demonstrating ability to slightly advance R LE with use of adductors for compensation.   Block practice squat pivot transfers wheelchair<>mat to B sides (B sides to wheelchair and B sides to mat) with minA progressing to close supervision. Patient continues to require manual facilitation to maintain weight bearing through R LE when transferring to the R.  Stair negotiation x3 steps with L handrail and modA. Patient requires manual facilitation for placement of R LE both ascending and descending steps. NuStep Level 1 x7' with L UE and B LE with emphasis on flexion/extension patterns of R LE.  Patient returned to room and left sitting in wheelchair with seatbelt donned and all needs within reach. R LE elevating leg rest and R half lap tray added.  Therapy Documentation Precautions:  Precautions Precautions: Fall Precaution Comments: R hemi, expressive aphasia Restrictions Weight Bearing Restrictions: No Other Position/Activity Restrictions: no limitations given in chart regarding knee ROM or WB'ing, x-ray negative  for bony abnormality Pain: Pain Assessment Pain Assessment: No/denies pain Pain Score: 0-No pain Locomotion : Ambulation Ambulation/Gait Assistance: 3: Mod assist Wheelchair Mobility Distance: 150   See FIM for current functional status  Therapy/Group: Individual Therapy  Chipper HerbBridget S Clemente Dewey S. Corbyn Wildey, PT, DPT 12/11/2013, 9:52 AM

## 2013-12-11 NOTE — Progress Notes (Signed)
Physical Therapy Session Note  Patient Details  Name: Bruce [name] MRN: [id number] Date of Birth: 1980-05-02  [name] Date: 12/11/2013 PT Individual Time: 1530-1630 PT Individual Time Calculation (min): 60 min   Short Term Goals: Week 3:  PT Short Term Goal 1 (Week 3): STGs=LTGs  Skilled Therapeutic Interventions/Progress Updates:  1:1. Pt received sitting in w/c, calling out and stated need to use bathroom. Focus this session on functional w/c propulsion, functional transfers and NMR. Pt req mod A to stand for toileting, but able to stand and manage clothing and pericare. Pt req supervision for w/c propulsion 160'x2 with use of L UE/LE and min cues for obstacle negotiation as well as management to of brakes. Pt req min A for squat pivot t/f w/c<>tx mat.   Pt engaged in standing horseshoe toss to challenge balance, facilitate anterior lean and weightshifting onto R LE while grasping horseshoes with L UE in all quadrants as well as from floor. Pt req min-max A for balance with cues for emergent awareness of body position. Therapist manually facilitating trunk flexion at hip level as well as preventing R genu recurvatum.   Pt challenged with floor transfer to target problem solving and R LE coordination, tone management and increased WB. Pt req max A for completion with multimodal cues.   Pt unable to tolerate prone position to stretch R hamstrings due to tight hip flexor musculature. Passive stretching to hip flexors +/- knee straight to incorporate hamstrings in sidelying. Min A for t/f sup<>sit.   RN aware and present to provide pain medicine at end of session due to pt reporting pain in head and R LE. Pt left sitting in w/c at end of session w/ all needs in reach and quick release belt in place.   Therapy Documentation Precautions:  Precautions Precautions: Fall Precaution Comments: R hemi, expressive aphasia Restrictions Weight Bearing Restrictions: No Other Position/Activity  Restrictions: no limitations given in chart regarding knee ROM or WB'ing, x-ray negative for bony abnormality Vital Signs: Therapy Vitals Temp: 98.1 F (36.7 C) Temp Source: Oral Pulse Rate: 62 Resp: 18 BP: 109/61 mmHg Patient Position (if appropriate): Lying Oxygen Therapy SpO2: 100 %  See FIM for current functional status  Therapy/Group: Individual Therapy  Bruce [name], Bruce [name] 12/11/2013, 5:29 PM

## 2013-12-11 NOTE — Progress Notes (Signed)
Speech Language Pathology Daily Session Notes  Patient Details  Name: Bruce [name] MRN: [id number] Date of Birth: 22-Sep-1980  Today's Date: 12/11/2013  Session  1: SLP Individual Time: 1015-1100 SLP Individual Time Calculation (min): 45 min  Session 2: SLP Individual Time: [id number] SLP Individual Time Calculation (min): 45 min  Short Term Goals: Week 3: SLP Short Term Goal 1 (Week 3): Patient will demonstrate selective attention to a structured task for 45 minutes with Min A multimodal cues.  SLP Short Term Goal 2 (Week 3): Patient will name functional items with Min A multimodal cues. SLP Short Term Goal 3 (Week 3): Patient will perfrom verbal, automatic tasks with Min A multimodal cues.  SLP Short Term Goal 4 (Week 3): Patient will self-monitor and correct verbal errors with Mod A multimodal cues.  SLP Short Term Goal 5 (Week 3): Patient will consume Dys 2 textures with use of compensatory strategies with Min A multimodal cues.  SLP Short Term Goal 6 (Week 3): Patient will demonstrate efficient mastication with trials of Dys. 3 textures with minimal buccal pocketing with supervision multimodal cues.   Skilled Therapeutic Interventions:  Session 1: Skilled treatment session focused on speech and cognitive-linguistic goals. Upon arrival, patient was sitting upright in the wheelchair and was agreeable to participate in treatment session. SLP facilitated the session by providing total A for naming of functional items despite Max A multimodal cues with patient's verbal errors consisting of omissions and substitutions.  Patient was able to self-monitor errors but required total A to self-correct. The patient also required Min A multimodal cues to identify functional items from a field of four.  Suspect overall function with structured tasks was impacting by patient's decreased attention and constant talking/laughing throughout the session.  SLP also facilitated session with a basic but  functional conversation in regards to familiar staff, etc. Patient demonstrated increased ability to express his wants/needs spontaneously at the phrase level throughout the conversation with Mod A multimodal cues. Patient was left in wheelchair with quick release belt in place and all needs within reach. Continue with current plan of care.    Session 2: Skilled treatment session focused on cognitive-linguistic and dysphagia goals. Upon arrival, patient was asleep but was easily aroused and agreeable to participate in treatment session. Patient was transferred from the bed to the wheelchair with Mod A via the NT and patient required Min A multimodal cues for sequencing. SLP facilitated the session by providing trials of Dys. 3 textures without overt s/s of aspiration. Patient demonstrated efficient mastication with minimal oral residue and buccal pocketing that the patient self-monitored and corrected with supervision verbal cues. Patient also reported he masticates his food on the right side of his oral cavity to reduce pocketing. Patient required Mod A multimodal cues to alternate attention between functional conversation and trials throughout the session. Patient also demonstrated increased spontaneous speech at the phrase level. Patient left in wheelchair with quick release in place and all needs within reach. Continue with current plan of care.   FIM:  Comprehension Comprehension Mode: Auditory Comprehension: 4-Understands basic 75 - 89% of the time/requires cueing 10 - 24% of the time Expression Expression Mode: Verbal Expression: 3-Expresses basic 50 - 74% of the time/requires cueing 25 - 50% of the time. Needs to repeat parts of sentences. Social Interaction Social Interaction: 3-Interacts appropriately 50 - 74% of the time - May be physically or verbally inappropriate. Problem Solving Problem Solving: 3-Solves basic 50 - 74% of the time/requires cueing 25 -  49% of the time Memory Memory:  3-Recognizes or recalls 50 - 74% of the time/requires cueing 25 - 49% of the time FIM - Eating Eating Activity: 5: Supervision/cues  Pain Pain Assessment Pain Assessment: No/denies pain  Therapy/Group: Individual Therapy  Bruce [name] 12/11/2013, 4:17 PM

## 2013-12-11 NOTE — Plan of Care (Signed)
Problem: RH PAIN MANAGEMENT Goal: RH STG PAIN MANAGED AT OR BELOW PT'S PAIN GOAL < 4  Outcome: Progressing No c/o pain today

## 2013-12-11 NOTE — Progress Notes (Signed)
Occupational Therapy Session Note  Patient Details  Name: Bruce [name] MRN: [id number] Date of Birth: 1980-12-20  Today's Date: 12/11/2013 OT Individual Time: 0800-0900 OT Individual Time Calculation (min): 60 min    Short Term Goals: Week 2:  OT Short Term Goal 1 (Week 2): Pt will stand for 45 sec during self-care task with min assist OT Short Term Goal 1 - Progress (Week 2): Met OT Short Term Goal 2 (Week 2): Pt will complete toilet transfer with min assist OT Short Term Goal 2 - Progress (Week 2): Progressing toward goal OT Short Term Goal 3 (Week 2): Pt will wash 10/10 body parts with min cues for initiation and completion OT Short Term Goal 3 - Progress (Week 2): Progressing toward goal OT Short Term Goal 4 (Week 2): Pt will complete toilet task with mod assist OT Short Term Goal 4 - Progress (Week 2): Met OT Short Term Goal 5 - Progress (Week 2): Met  Skilled Therapeutic Interventions/Progress Updates:    Pt engaged in BADL retraining including bathing at shower level and dressing with sit<>stand from w/c.  Pt indicated he needed to use toilet and voided while standing at toilet, requiring min A for balance.  Pt required min verbal cues for sequencing during bathing and mod A while standing to bathe buttocks and periarea.  Pt required mod A while standing to pull up pants at sink.  Pt initiated hemi dressing techniques without verbal cues.  Focus on activity tolerance, transfers, sit<>stand, task initiation, sequencing, attention to task, hemi dressing and bathing techniques, and safety awareness.  Therapy Documentation Precautions:  Precautions Precautions: Fall Precaution Comments: R hemi, expressive aphasia Restrictions Weight Bearing Restrictions: No Other Position/Activity Restrictions: no limitations given in chart regarding knee ROM or WB'ing, x-ray negative for bony abnormality Pain: Pain Assessment Pain Assessment: 0-10 Pain Score: Asleep Pain Type: Acute  pain Pain Location: Head Pain Intervention(s): Medication (See eMAR) ADL: ADL ADL Comments: see FIM  See FIM for current functional status  Therapy/Group: Individual Therapy  Leroy Libman 12/11/2013, 9:05 AM
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
## 2013-12-12 ENCOUNTER — Inpatient Hospital Stay (HOSPITAL_COMMUNITY): Payer: Medicaid Other

## 2013-12-12 ENCOUNTER — Inpatient Hospital Stay (HOSPITAL_COMMUNITY): Payer: Medicaid Other | Admitting: *Deleted

## 2013-12-12 ENCOUNTER — Inpatient Hospital Stay (HOSPITAL_COMMUNITY): Payer: Medicaid Other | Admitting: Physical Therapy

## 2013-12-12 ENCOUNTER — Inpatient Hospital Stay (HOSPITAL_COMMUNITY): Payer: Self-pay | Admitting: Speech Pathology

## 2013-12-12 ENCOUNTER — Inpatient Hospital Stay (HOSPITAL_COMMUNITY): Payer: Medicaid Other | Admitting: Speech Pathology
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
## 2013-12-12 NOTE — Progress Notes (Addendum)
Physical Therapy Session Note  Patient Details  Name: Bruce [name] MRN: [id number] Date of Birth: [birth year]  Today's Date: 12/12/2013 PT Individual Time: 1545-1615 PT Individual Time Calculation (min): 30 min   Short Term Goals: Week 3:  PT Short Term Goal 1 (Week 3): STGs=LTGs  Skilled Therapeutic Interventions/Progress Updates:    Pt received seated in w/c; agreeable to therapy. Session focused on gait training with emphasis on RLE advancement. Pt performed w/c mobility to/from gym with L hemi technique, supervision. Pt performed multiple trials of gait x30' in controlled environment with L rail requiring min A for stability, manual facilitation of R anterior pelvic rotation, anterior pelvic tilt, and manual advancement of RLE (when unable to compensate with R hip adductors). With RLE advancement in neutral pelvic/hip alignment, noted increased tone in R ankle plantarflexors causing R genu recurvatum. Also noted increase in RLE flexor synergy (hip flexion, adduction, internal rotation) causing pain with weightbearing. Tone/flexor synergy and pain did not appear to diminish with weightbearing. Session ended in pt room, where pt was left seated in w/c with quick release belt in place for safety, NT present, and all needs within reach.  Therapy Documentation Precautions:  Precautions Precautions: Fall Precaution Comments: R hemi, expressive aphasia Restrictions Weight Bearing Restrictions: No Other Position/Activity Restrictions: no limitations given in chart regarding knee ROM or WB'ing, x-ray negative for bony abnormality Vital Signs: Therapy Vitals Temp: 99.3 F (37.4 C) Temp Source: Oral Pulse Rate: 64 Resp: 15 BP: 91/72 mmHg Patient Position (if appropriate): Sitting Oxygen Therapy SpO2: 100 % O2 Device: Not Delivered Pain: Pain Assessment Pain Assessment: No/denies pain Locomotion : Ambulation Ambulation/Gait Assistance: Not tested (comment) Wheelchair  Mobility Distance: 150   See FIM for current functional status  Therapy/Group: Individual Therapy  Hobble, Lorenda IshiharaBlair A 12/12/2013, 4:53 PM

## 2013-12-12 NOTE — Progress Notes (Signed)
Speech Language Pathology Daily Session Notes  Patient Details  Name: Bruce [name] XXXSmith MRN: [id number] Date of Birth: Nov 12, 1980  Today's Date: 12/12/2013  Session 1: SLP Individual Time: 1015-1100 SLP Individual Time Calculation (min): 45 min  Session 2: SLP Group Time: [id number] SLP Group Time Calculation (min): 30 min  Short Term Goals: Week 3: SLP Short Term Goal 1 (Week 3): Patient will demonstrate selective attention to a structured task for 45 minutes with Min A multimodal cues.  SLP Short Term Goal 2 (Week 3): Patient will name functional items with Min A multimodal cues. SLP Short Term Goal 3 (Week 3): Patient will perfrom verbal, automatic tasks with Min A multimodal cues.  SLP Short Term Goal 4 (Week 3): Patient will self-monitor and correct verbal errors with Mod A multimodal cues.  SLP Short Term Goal 5 (Week 3): Patient will consume Dys 2 textures with use of compensatory strategies with Min A multimodal cues.  SLP Short Term Goal 6 (Week 3): Patient will demonstrate efficient mastication with trials of Dys. 3 textures with minimal buccal pocketing with supervision multimodal cues.   Skilled Therapeutic Interventions:  Session 1: Skilled treatment session focused on cognitive-linguistic goals. SLP facilitated session by providing Min A multimodal cues for approximations of targeted words during a sentence completion task and Max A visual, written and demonstration cues to self-monitor and correct verbal errors that consisted of omission and substitutions.  Patient demonstrates emergent awareness of errors but continues to perseverate on "why" despite multiple explanations, etc. Patient left in chair with quick release belt in place. Continue with current plan of care.    Session 2: Skilled treatment session focused on dysphagia goals. SLP facilitated session by providing intermittent supervision verbal cues for utilization of swallowing compensatory strategies during upgraded  lunch tray of Dys. 3 textures. Patient demonstrated efficient mastication with minimal buccal pocketing and no overt s/s of aspiration, therefore, recommend patient upgrade to Dys. 3 textures. Patient demonstrated increased ability to express basic wants/needs at the phrase level, especially in regards to pain. Patient left in wheelchair with quick release belt in place and all needs within reach. Continue with current plan of care.   FIM:  Comprehension Comprehension Mode: Auditory Comprehension: 4-Understands basic 75 - 89% of the time/requires cueing 10 - 24% of the time Expression Expression Mode: Verbal Expression: 3-Expresses basic 50 - 74% of the time/requires cueing 25 - 50% of the time. Needs to repeat parts of sentences. Social Interaction Social Interaction: 3-Interacts appropriately 50 - 74% of the time - May be physically or verbally inappropriate. Problem Solving Problem Solving: 3-Solves basic 50 - 74% of the time/requires cueing 25 - 49% of the time Memory Memory: 3-Recognizes or recalls 50 - 74% of the time/requires cueing 25 - 49% of the time FIM - Eating Eating Activity: 5: Supervision/cues  Pain Headache, Unable to rate. RN notified and utilized aromatherapy and administered pain medications.   Therapy/Group: Individual Therapy  Abrahm Mancia 12/12/2013, 4:36 PM
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
## 2013-12-12 NOTE — Progress Notes (Signed)
Occupational Therapy Session Note  Patient Details  Name: Bruce [name] MRN: [id number] Date of Birth: [birth year]  Today's Date: 12/12/2013 OT Individual Time: 0815-0900 OT Individual Time Calculation (min): 45 min    Short Term Goals: Week 2:  OT Short Term Goal 1 (Week 2): Pt will stand for 45 sec during self-care task with min assist OT Short Term Goal 1 - Progress (Week 2): Met OT Short Term Goal 2 (Week 2): Pt will complete toilet transfer with min assist OT Short Term Goal 2 - Progress (Week 2): Progressing toward goal OT Short Term Goal 3 (Week 2): Pt will wash 10/10 body parts with min cues for initiation and completion OT Short Term Goal 3 - Progress (Week 2): Progressing toward goal OT Short Term Goal 4 (Week 2): Pt will complete toilet task with mod assist OT Short Term Goal 4 - Progress (Week 2): Met OT Short Term Goal 5 - Progress (Week 2): Met Week 3:  OT Short Term Goal 1 (Week 3): Pt will complete UB dressing with min assist and min cues for problem solving  OT Short Term Goal 2 (Week 3): Pt will complete toilet task with min assist  OT Short Term Goal 3 (Week 3): Pt will complete bathing with min assist and supervision cues for sequencing and problem solving OT Short Term Goal 4 (Week 3): Pt will demonstrate sustained attention to self-care task for 2 min with min cues  Skilled Therapeutic Interventions/Progress Updates:    Pt seen for ADL retraining with focus on functional transfers, standing balance, and cognitive awareness. Pt received supine in bed requesting to go to bathroom. Pt completed stand pivot transfer w/c>toilet (to right) with mod assist. Stood with min-mod assist for clothing management. Pt with supervision for sitting balance on toilet and demonstrated use of call light when completed task. Completed stand pivot transfer toilet>w/c (to left) with min assist. Completed hand hygiene at sink from w/c level with pt initiating washing R hand. Did not  complete bathing this AM d/t time. Completed dressing at sink with mod cues for problem solving with crossover and hemi dressing techniques. Pt stood with min-mod assist and cues for anterior weight shift in standing for postural control. Pt left sitting in w/c with all needs in reach.   Therapy Documentation Precautions:  Precautions Precautions: Fall Precaution Comments: R hemi, expressive aphasia Restrictions Weight Bearing Restrictions: No Other Position/Activity Restrictions: no limitations given in chart regarding knee ROM or WB'ing, x-ray negative for bony abnormality General:   Vital Signs: Therapy Vitals Temp: 97.8 F (36.6 C) Temp Source: Oral Pulse Rate: 63 Resp: 18 BP: 102/68 mmHg Patient Position (if appropriate): Lying Oxygen Therapy SpO2: 100 % O2 Device: Not Delivered Pain: Pain Assessment Pain Assessment: Faces Faces Pain Scale: Hurts little more  See FIM for current functional status  Therapy/Group: Individual Therapy  Rush Salce, Quillian Quince 12/12/2013, 10:10 AM

## 2013-12-12 NOTE — Progress Notes (Signed)
Physical Therapy Session Note  Patient Details  Name: Bruce [name] MRN: [id number] Date of Birth: 1980-04-30  Today's Date: 12/12/2013 PT Individual Time: 0912-1000 and 1300-1414 PT Individual Time Calculation (min): 48 min and 74 min  Short Term Goals: Week 3:  PT Short Term Goal 1 (Week 3): STGs=LTGs  Skilled Therapeutic Interventions/Progress Updates:    AM Session: Patient received sitting in wheelchair. Session focused on wheelchair mobility, gait training, and R LE NMR. Wheelchair mobility >150' with minA to initiate propulsion, progressing to supervision with use of L hemi technique. R DF ace wrap donned at beginning of session. Patient performed gait training 6350' x1 with hemiwalker and modA in controlled environment. Patient requires mod-max cues overall for redirection to task as patient highly internally/externally distractible. Patient continues to requires manual facilitation for lateral weight shift to the R and for complete advancement of R LE. Noted patient with use of adductors to advance R LE to neutral, requires assist to advance ahead of L LE. Patient without any instances of R knee buckling with weight bearing.  Sit>supine on mat with minA with patient positioned supine on edge of mat with R LE off mat to facilitate R hip flexor stretch and assess strength against gravity. Patient demonstrates compensations in attempt to flex R hip, noted palpable muscle contraction, but no movement against gravity. Supine>sit with modA from R side. R LE NMR activity with cognitive task of identifying letters and naming words of each subsequent letter: sit<>stand with L LE elevated on 4 in step to facilitate increased weight bearing through R LE with minA overall; in standing tapping letter ball back to student therapist with min-maxA for standing with L LE elevated on step.  Patient continues to requires repeated max cues for attention to task during session. Patient returned to room and  left seated in wheelchair with seatbelt donned and all needs within reach.  PM Session: Patient received supine in bed. Session focused on functional transfers, wheelchair mobility, R LE NMR, and stair negotiation. Patient performing sit>supine with supervision and hemi technique, minA for supine>sit from mat from L side. Squat pivot transfers with min guard progressing to close supervision when given mod verbal cues. Supine lumbar rotations and bridging. See below for R LE NMR details. Wheelchair mobility >150' x1 with supervision and L hemi technique. Stair negotiation x3 steps with L handrail and modA, patient able to recall proper sequencing with ascending/descending.  Wheelchair obstacle course x4 with instruction to remain quiet throughout in order to facilitate improved sustained attention and impulsive speech. Patient continues to laugh excessively throughout session, which impacts ability to attend to tasks. Patient returned to room and left seated in wheelchair with seatbelt donned and all needs within reach.  Therapy Documentation Precautions:  Precautions Precautions: Fall Precaution Comments: R hemi, expressive aphasia Restrictions Weight Bearing Restrictions: No Other Position/Activity Restrictions: no limitations given in chart regarding knee ROM or WB'ing, x-ray negative for bony abnormality Pain: Pain Assessment Pain Assessment: Faces Faces Pain Scale: Hurts little more Locomotion : Ambulation Ambulation/Gait Assistance: 3: Mod assist Wheelchair Mobility Distance: 150  Other Treatments: Treatments Neuromuscular Facilitation: Right;Lower Extremity;Forced use;Activity to increase grading;Activity to increase sustained activation Weight Bearing Technique Weight Bearing Technique: Yes RUE Weight Bearing Technique: High kneeling LUE Weight Bearing Technique: High kneeling Response to Weight Bearing Technique: Patient performed pre-gait activities in tall kneeling: R LE  forward/retro stepping with manual facilitation to do so, verbal cues for L weight shift; mini squats in tall kneeling; L hand  on R hand on kaye bench to increase weight bearing through R UE.  See FIM for current functional status  Therapy/Group: Individual Therapy  Chipper HerbBridget S Creighton Longley S. Johnetta Sloniker, PT, DPT 12/12/2013, 2:49 PM

## 2013-12-13 ENCOUNTER — Inpatient Hospital Stay (HOSPITAL_COMMUNITY): Payer: Medicaid Other

## 2013-12-13 ENCOUNTER — Inpatient Hospital Stay (HOSPITAL_COMMUNITY): Payer: Medicaid Other | Admitting: Speech Pathology

## 2013-12-13 ENCOUNTER — Ambulatory Visit (HOSPITAL_COMMUNITY): Payer: Self-pay
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
## 2013-12-13 NOTE — Progress Notes (Signed)
Physical Therapy Session Note  Patient Details  Name: Bruce [name] XXXSmith MRN: [id number] Date of Birth: Jan 20, 1981  Today's Date: 12/13/2013 PT Individual Time: 1100-1200 PT Individual Time Calculation (min): 60 min   Short Term Goals: Week 3:  PT Short Term Goal 1 (Week 3): STGs=LTGs  Skilled Therapeutic Interventions/Progress Updates:  1:1. Pt received sitting in w/c, ready for therapy but report of pain in head and R LE. RN present to provide medication. Focus this session on functional w/c propulsion and supine NMR. Pt req min A for w/c propulsion 150'x2 with L UE/LE due to increased tone in R LE, difficulty maintaining placement on leg rest. Pt req min A for squat pivot t/f w/c<>tx mat and t/f sup<>sit.   In supine, focus on R LE tone management. At rest, R LE positioned in hip add and knee flexion. Initial emphasis on rhythmic relaxation of B LE in hooklying position, progressing to passive isolated hamstring and adductor stretching then combination of, excellent tolerance to passive D1 LE PNF to target muscle relaxation in a functional pattern. Unable to facilitate voluntary motor control for assist during stretching or PNF. Pt reporting significant improvement in pain, both headache and R LE upon completion.   Pt challenged to limit internal/external distractions during session as well as promote relaxation through minimal talking. Pt did excellent job of being quiet throughout session. Pt left sitting in w/c at end of session w/ all needs in reach, quick release belt in place and R LE elevated on chair for prolonged stretch.   Therapy Documentation Precautions:  Precautions Precautions: Fall Precaution Comments: R hemi, expressive aphasia Restrictions Weight Bearing Restrictions: No Other Position/Activity Restrictions: no limitations given in chart regarding knee ROM or WB'ing, x-ray negative for bony abnormality Pain: Pain Assessment Pain Assessment: 0-10 Pain Score: 10-Worst  pain ever Pain Type: Acute pain Pain Location: Head Pain Descriptors / Indicators: Spasm Pain Frequency: Intermittent Pain Onset: On-going Patients Stated Pain Goal: 10 Pain Intervention(s): Medication (See eMAR)  See FIM for current functional status  Therapy/Group: Individual Therapy  [name], Shary Lamos S 12/13/2013, 12:10 PM

## 2013-12-13 NOTE — Progress Notes (Signed)
Speech Language Pathology Daily Session Notes  Patient Details  Name: Bruce [name] MRN: [id number] Date of Birth: September 07, 1980  Today's Date: 12/13/2013  Session 1: SLP Individual Time: [id number] SLP Individual Time Calculation (min): 45 min  Session 2: SLP Individual Time: 1445-1530 SLP Individual Time Calculation (min): 45 min  Short Term Goals: Week 3: SLP Short Term Goal 1 (Week 3): Patient will demonstrate selective attention to a structured task for 45 minutes with Min A multimodal cues.  SLP Short Term Goal 2 (Week 3): Patient will name functional items with Min A multimodal cues. SLP Short Term Goal 3 (Week 3): Patient will perfrom verbal, automatic tasks with Min A multimodal cues.  SLP Short Term Goal 4 (Week 3): Patient will self-monitor and correct verbal errors with Mod A multimodal cues.  SLP Short Term Goal 5 (Week 3): Patient will consume Dys 2 textures with use of compensatory strategies with Min A multimodal cues.  SLP Short Term Goal 6 (Week 3): Patient will demonstrate efficient mastication with trials of Dys. 3 textures with minimal buccal pocketing with supervision multimodal cues.   Skilled Therapeutic Interventions:  Session 1: Skilled treatment session focused on speech and cognitive-linguistic goals.  Upon arrival, patient was sitting upright in the wheelchair while holding his head.  Patient independently reported he had a severe headache. RN made aware and medications were given.  Patient was initially perseverative on his headache and demonstrated verbosity but was eventually redirected to a sequencing task with Mod  A verbal and visual cues. Patient sequenced 4 step picture cards with 100% accuracy with Mod I and 6 step picture cards with 60% accuracy with Mod A multimodal cues to self-monitor and correct errors.  Patient was also able to independently scan and attend to the right field of environment throughout the task. Patient left in wheelchair with quick  release belt in place and all needs within reach. Continue with current plan of care.     Session 2: Skilled treatment session focused on speech and cognitive-linguistic goals. Upon arrival, patient was sitting upright in the wheelchair while holding his head.  Patient independently reported he had a severe headache. RN made aware and medications were given. Patient participated in a basic reading comprehension and written expression task at the word level. Patient demonstrated 100% accuracy for reading comprehension and required Min A question and verbal cues for word-finding during a generative naming task and Mod A multimodal cues for appropriate spelling. Patient's sister present at end of session and educated on patient's current progress, SLP goals and current barriers ( attention and verbosity). Patient's sister verbalized understanding and asked appropriate questions. Patient was left in the wheelchair with the quick release belt in place and all needs within reach. Continue with current plan of care.   FIM:  Comprehension Comprehension Mode: Auditory Comprehension: 4-Understands basic 75 - 89% of the time/requires cueing 10 - 24% of the time Expression Expression Mode: Verbal Expression: 3-Expresses basic 50 - 74% of the time/requires cueing 25 - 50% of the time. Needs to repeat parts of sentences. Social Interaction Social Interaction: 3-Interacts appropriately 50 - 74% of the time - May be physically or verbally inappropriate. Problem Solving Problem Solving: 3-Solves basic 50 - 74% of the time/requires cueing 25 - 49% of the time Memory Memory: 3-Recognizes or recalls 50 - 74% of the time/requires cueing 25 - 49% of the time  Pain 10/10 in head, RN made aware and medications were administered   Therapy/Group: Individual Therapy  Bruce [name] 12/13/2013, 11:10 AM

## 2013-12-13 NOTE — Progress Notes (Signed)
Occupational Therapy Session Note  Patient Details  Name: Bruce [name] MRN: [id number] Date of Birth: April 04, 1980  Today's Date: 12/13/2013 OT Individual Time: [id number] and 1300-1400 OT Individual Time Calculation (min): 60 min and 60 min     Short Term Goals: Week 3:  OT Short Term Goal 1 (Week 3): Pt will complete UB dressing with min assist and min cues for problem solving  OT Short Term Goal 2 (Week 3): Pt will complete toilet task with min assist  OT Short Term Goal 3 (Week 3): Pt will complete bathing with min assist and supervision cues for sequencing and problem solving OT Short Term Goal 4 (Week 3): Pt will demonstrate sustained attention to self-care task for 2 min with min cues  Skilled Therapeutic Interventions/Progress Updates:    Session 1: Pt seen for ADL retraining with focus on sustained attention, functional transfers, activity tolerance, and cognitive remediation. Pt received supine in bed initially declining therapy d/t headache. Provided PROM to RUE and RLE. Pt motivated to shower this AM, therefore agreeable to shower. Completed squat pivot transfer bed>w/c with min guard assist and mod cues. Completed toileting in standing with mod assist standing balance. Completed bathing at shower level with mod cues from problem solving crossover technique to wash RLE. Pt required max cues for sustained attention to task d/t internal distractions. Completed dressing from w/c level with mod cues for hemi dressing technique and pt managing crossover technique himself with increased time. Completed sit<>stand at sink with min assist and cues for achieving balance before attempting to manage clothing over waist. Pt donning sweatshirt with increased time and min cues for hemi dressing. Pt left sitting in w/c with NT to assist with breakfast.   Session 2: Pt seen for therapeutic co-tx with RT with focus on standing balance, postural control, cognitive remediation, and R NMR. Pt received  sitting in w/c talking excessively with no audience. Pt required max cues to redirect throughout session. Pt propelled self to family room with min cues to remain silent to increase attention to task. Engaged in games of black jack in sitting and standing with emphasis on standing balance and weight shifting to right when reaching for cards. Pt tolerated standing well and required min assist for balance. Pt demonstrated ~90% accuracy with adding cards. Engaged in baseball game in standing with min assist for balance and emphasis on NMR to trunk and weight shifting to R with swinging. Pt cued to keep counts of hits up to 10, however pt attended for 4-5 hits. Pt returned to room and left with all needs in reach.   Therapy Documentation Precautions:  Precautions Precautions: Fall Precaution Comments: R hemi, expressive aphasia Restrictions Weight Bearing Restrictions: No Other Position/Activity Restrictions: no limitations given in chart regarding knee ROM or WB'ing, x-ray negative for bony abnormality General:   Vital Signs: Therapy Vitals Temp: 97.9 F (36.6 C) Temp Source: Oral Pulse Rate: 62 Resp: 16 BP: 113/63 mmHg Patient Position (if appropriate): Lying Oxygen Therapy SpO2: 100 % O2 Device: Not Delivered Pain: Pt with report of "headache" and pain from muscle spasms in BLE. RN notified. Pt not providing numerical rating of pain stating it hurt "real bad."  See FIM for current functional status  Therapy/Group: Individual Therapy  Daneil Danerkinson, Isamu Trammel N 12/13/2013, 9:26 AM

## 2013-12-14 ENCOUNTER — Inpatient Hospital Stay (HOSPITAL_COMMUNITY): Payer: Medicaid Other | Admitting: *Deleted

## 2013-12-14 ENCOUNTER — Inpatient Hospital Stay (HOSPITAL_COMMUNITY): Payer: Medicaid Other | Admitting: Speech Pathology

## 2013-12-14 ENCOUNTER — Inpatient Hospital Stay (HOSPITAL_COMMUNITY): Payer: Medicaid Other

## 2013-12-14 NOTE — Progress Notes (Signed)
Speech Language Pathology Daily Session Note  Patient Details  Name: Bruce [name] XXXSmith MRN: [id number] Date of Birth: Feb 01, 1981  Today's Date: 12/14/2013 SLP Group Time: 0900-1000 SLP Group Time Calculation (min): 60 min  Short Term Goals: Week 3: SLP Short Term Goal 1 (Week 3): Patient will demonstrate selective attention to a structured task for 45 minutes with Min A multimodal cues.  SLP Short Term Goal 2 (Week 3): Patient will name functional items with Min A multimodal cues. SLP Short Term Goal 3 (Week 3): Patient will perfrom verbal, automatic tasks with Min A multimodal cues.  SLP Short Term Goal 4 (Week 3): Patient will self-monitor and correct verbal errors with Mod A multimodal cues.  SLP Short Term Goal 5 (Week 3): Patient will consume Dys 2 textures with use of compensatory strategies with Min A multimodal cues.  SLP Short Term Goal 6 (Week 3): Patient will demonstrate efficient mastication with trials of Dys. 3 textures with minimal buccal pocketing with supervision multimodal cues.   Skilled Therapeutic Interventions: Skilled treatment session focused on cognitive-linguistic goals. SLP facilitated session by providing Mod A question cues for selective attention to a task in mildly distracting environment and for recall of newly learned information. Patient participated in a basic card task with another patient and required extra time and Min A multimodal cues for functional problem solving with task. Patient also required Max A multimodal cues for verbal expression at the word level in regards to naming the colors and numbers on the cards. Patient handed off to OT. Continue with current plan of care.    FIM:  Comprehension Comprehension Mode: Auditory Comprehension: 4-Understands basic 75 - 89% of the time/requires cueing 10 - 24% of the time Expression Expression Mode: Verbal Expression: 3-Expresses basic 50 - 74% of the time/requires cueing 25 - 50% of the time. Needs to  repeat parts of sentences. Social Interaction Social Interaction: 4-Interacts appropriately 75 - 89% of the time - Needs redirection for appropriate language or to initiate interaction. Problem Solving Problem Solving: 3-Solves basic 50 - 74% of the time/requires cueing 25 - 49% of the time Memory Memory: 3-Recognizes or recalls 50 - 74% of the time/requires cueing 25 - 49% of the time  Pain Pain Assessment Pain Assessment: Faces Pain Score: 6  Faces Pain Scale: Hurts even more (unable to say where) Pain Type: Acute pain Pain Location: Head Pain Descriptors / Indicators: Aching;Headache Pain Frequency: Intermittent Pain Onset: Gradual Patients Stated Pain Goal: 3 Pain Intervention(s): Medication (See eMAR)  Therapy/Group: Group Therapy  Parilee Hally 12/14/2013, 11:30 AM

## 2013-12-14 NOTE — Progress Notes (Signed)
Occupational Therapy Session Note  Patient Details  Name: Bruce [name] MRN: [id number] Date of Birth: March 29, 1980  Today's Date: 12/14/2013 OT Individual Time: 1000-1100 OT Individual Time Calculation (min): 60 min    Short Term Goals: Week 3:  OT Short Term Goal 1 (Week 3): Pt will complete UB dressing with min assist and min cues for problem solving  OT Short Term Goal 2 (Week 3): Pt will complete toilet task with min assist  OT Short Term Goal 3 (Week 3): Pt will complete bathing with min assist and supervision cues for sequencing and problem solving OT Short Term Goal 4 (Week 3): Pt will demonstrate sustained attention to self-care task for 2 min with min cues  Skilled Therapeutic Interventions/Progress Updates:    Pt seen for ADL retaining with focus on sustained attention, functional transfers, standing balance, and awareness. Pt received sitting in w/c requesting to toilet. Pt completed stand pivot transfer w/c<>toilet with min assist and mod cues. Pt completed hygiene in sitting then clothing management in standing with min assist. Pt completed bathing at shower level initially requiring max cues for sustained attention as pt demonstrating excessive talking. Pt progressed to min cues for sustained attention and demonstrating improved problem solving and carryover of crossover technique without cues. Completed dressing at sink with mod cues for hemi dressing and increased time. Pt required min assist for sit<>stand and mod cues for safety d/t impulsivity. Provided PROM to RUE with pt tolerating well and minimal muscle tone noted. Pt left sitting in w/c with all needs in reach.   Therapy Documentation Precautions:  Precautions Precautions: Fall Precaution Comments: R hemi, expressive aphasia Restrictions Weight Bearing Restrictions: No Other Position/Activity Restrictions: no limitations given in chart regarding knee ROM or WB'ing, x-ray negative for bony  abnormality General: General PT Missed Treatment Reason: Other (Comment) (eating breakfast) Vital Signs:   Pain: Pain Assessment Pain Assessment: Faces Pain Score: 4  Faces Pain Scale: Hurts even more (unable to say where) Pain Type: Acute pain Pain Location: Head Pain Descriptors / Indicators: Aching;Headache Pain Frequency: Intermittent Pain Onset: Gradual Patients Stated Pain Goal: 3 Pain Intervention(s): Medication (See eMAR)  See FIM for current functional status  Therapy/Group: Individual Therapy  Daneil Danerkinson, Tomasita Beevers N 12/14/2013, 12:17 PM

## 2013-12-14 NOTE — Progress Notes (Signed)
Physical Therapy Session Note  Patient Details  Name: [name] MRN: [id number] Date of Birth: 1981-02-02  Today's Date: 12/14/2013 PT Individual Time: 0821-0900 and 1400-1500 PT Individual Time Calculation (min): 39 min and 60 min  Short Term Goals: Week 3:  PT Short Term Goal 1 (Week 3): STGs=LTGs  Skilled Therapeutic Interventions/Progress Updates:    AM Session: Patient received sitting upright in bed, starting breakfast. Patient missed first 21 minutes of session secondary to eating breakfast. Session focused on functional transfers, wheelchair mobility, and R LE NMR. Patient requires max cues to redirect to tasks throughout session secondary to poor frustration tolerance with speech and distraction due to pain. Wheelchair mobility >150' x1 with L hemi technique and minA to initiate propulsion. At one time, due to poor frustration tolerance with propulsion, patient begins to propel very quickly with R LE falling off leg rest and patient continuing to propel with R foot under wheelchair. Discussed safety with R LE management and redirection to task with patient. Dynamic reaching with patient in [place] per[place] position in [place] with emphasis on reaching to R to increased weight bearing through R LE with overall close supervision to minA. Patient returned to room and left seated in wheelchair with seatbelt donned and all needs within reach.  PM Session: Patient received sidelying in bed. Session focused on gait training, standing dynamic balance, R LE NMR, and cognitive remediation. Patient performed gait training 6275' x2 with hemiwalker (no DF ace wrap) with modA secondary to patient continues to require manual facilitation for advancement and placement of R LE. No instances of R knee buckling, but intermittent genu recurvatum, which requires manual facilitation at posterior knee joint to decrease. Standing horseshoe toss to challenge balance, facilitate anterior lean and weightshifting onto R LE  while grasping horseshoes with L UE from floor. Patient requires min-max A for balance with cues for emergent awareness of body position. Therapist manually facilitating trunk flexion at hip level as well as preventing R genu recurvatum. Patient returned to room and left seated in wheelchair with seatbelt donned and all needs within reach.  Therapy Documentation Precautions:  Precautions Precautions: Fall Precaution Comments: R hemi, expressive aphasia Restrictions Weight Bearing Restrictions: No Other Position/Activity Restrictions: no limitations given in chart regarding knee ROM or WB'ing, x-ray negative for bony abnormality General: PT Amount of Missed Time (min): 21 Minutes PT Missed Treatment Reason: Other (Comment) (eating breakfast) Pain: Pain Assessment Pain Assessment: Faces Faces Pain Scale: Hurts even more (unable to say where) Locomotion : Ambulation Ambulation/Gait Assistance: Not tested (comment) Wheelchair Mobility Distance: 150   See FIM for current functional status  Therapy/Group: Individual Therapy  Chipper HerbBridget S Birttany Dechellis S. Danna Sewell, PT, DPT 12/14/2013, 10:00 AM

## 2013-12-14 NOTE — Progress Notes (Signed)
Occupational Therapy Session Note  Patient Details  Name: Bruce [name] M XXXSmith MRN: [id number] Date of Birth: 02-May-1980  Today's Date: 12/14/2013 OT Individual Time: 1130-1200 OT Individual Time Calculation (min): 30 min    Short Term Goals: Week 3:  OT Short Term Goal 1 (Week 3): Pt will complete UB dressing with min assist and min cues for problem solving  OT Short Term Goal 2 (Week 3): Pt will complete toilet task with min assist  OT Short Term Goal 3 (Week 3): Pt will complete bathing with min assist and supervision cues for sequencing and problem solving OT Short Term Goal 4 (Week 3): Pt will demonstrate sustained attention to self-care task for 2 min with min cues  Skilled Therapeutic Interventions/Progress Updates: Therapeutic activity with emphasis on improved management of RUE, sustained attention to task, and communication.   Pt received seated in his room, alert and cooperative, attempting expression with approximately 50% accuracy and aware of word-finding deficits.    Pt receptive to seated R-UE activity to improve mobility of right UE to include shoulder (scapular protraction/retraction), and elbow (extension/flexion) using arm skate to reduce load of arm and massage wand to stimulate muscular contraction (vibration).    After setup and education on task, pt performed exercises as directed with mod cues (tactile and verbal) to continue task to end of session.   Pt expressed satisfaction with task while noticing right shoulder flexion and elbow extension, assisted by left hand and forward leaning.   Pt returned to his room with all needs within reach and safety belt attached.    Therapy Documentation Precautions:  Precautions Precautions: Fall Precaution Comments: R hemi, expressive aphasia Restrictions Weight Bearing Restrictions: No Other Position/Activity Restrictions: no limitations given in chart regarding knee ROM or WB'ing, x-ray negative for bony abnormality  Pain: Pain  Assessment Pain Assessment: Faces Pain Score: 4  Faces Pain Scale: Hurts even more (unable to say where)  ADL: ADL ADL Comments: see FIM  See FIM for current functional status  Therapy/Group: Individual Therapy  Belmont Valli 12/14/2013, 12:45 PM

## 2013-12-14 NOTE — Progress Notes (Signed)
Social Work Patient ID: Bruce [name], male   DOB: 04-14-1980, 33 y.o.   MRN: [id number]   Met yesterday with pt and sister to review team conference.  Sister understands that team feels pt has reached a functional level that he could make transition to SNF.  Explained to pt that he will be changing venues because of his need for further 24/7 care and therapies.  He is in agreement with this plan.  Also discussed with sister about options that could be in place for pt when he is ready to transition out of SNF (i.e. Group home, supported living apartments).  She is agreeable with me making a referral to [facility] for care coordination in the community.  I have placed referral and sent records confirming TBI.  My hope is that the care coordinator will follow along with patient to facilitate these transitions as appropriate.  SNF bed search underway.  Will keep team posted.  Debroah Shuttleworth, LCSW

## 2013-12-15 ENCOUNTER — Inpatient Hospital Stay (HOSPITAL_COMMUNITY): Payer: Medicaid Other | Admitting: *Deleted

## 2013-12-15 ENCOUNTER — Inpatient Hospital Stay (HOSPITAL_COMMUNITY): Payer: Medicaid Other | Admitting: Speech Pathology

## 2013-12-15 ENCOUNTER — Inpatient Hospital Stay (HOSPITAL_COMMUNITY): Payer: Medicaid Other

## 2013-12-15 DIAGNOSIS — G8110 Spastic hemiplegia affecting unspecified side: Secondary | ICD-10-CM

## 2013-12-15 DIAGNOSIS — S069X0A Unspecified intracranial injury without loss of consciousness, initial encounter: Secondary | ICD-10-CM

## 2013-12-15 DIAGNOSIS — R4701 Aphasia: Secondary | ICD-10-CM

## 2013-12-15 DIAGNOSIS — S069X4S Unspecified intracranial injury with loss of consciousness of 6 hours to 24 hours, sequela: Secondary | ICD-10-CM

## 2013-12-15 NOTE — Progress Notes (Signed)
Occupational Therapy Weekly Progress Note  Patient Details  Name: Bruce [name] MRN: [id number] Date of Birth: [birth year]  Beginning of progress report period: December 08, 2013 End of progress report period: December 15, 2013  Today's Date: 12/15/2013 OT Individual Time: 1100-1200 OT Individual Time Calculation (min): 60 min    Patient has met 3 of 4 short term goals.  Patient has progressed during this reporting period. Patient consistently demonstrates ability to complete functional transfers (toilet and shower) with min assist and mod-max cues. Patient is demonstrating improved carryover of hemi dressing technique, requiring min-mod cues. Patient has increased tone in RUE and tolerates PROM well. Pt with no subluxation noted at this time. Patients biggest barriers at this time are sustained attention, cognitive -linguistic deficits, and R hemiplegia. Patient is currently demonstrating behaviors consistent with [place] Level VI.  Patient continues to demonstrate the following deficits: R hemiplegia, decreased sustained attention, cognitive-linguistic deficits, significant expressive aphasia, decreased balance, decreased postural control, decreased strength, decreased awareness, decreased coordination, decreased awareness, decreased functional use of RUE/RLE, decreased ability to compensate for deficits and therefore will continue to benefit from skilled OT intervention to enhance overall performance with BADL.  Patient progressing toward long term goals..  Continue plan of care. Upgraded UB dressing and shower transfer to supervision/setup assist.   OT Short Term Goals Week 3:  OT Short Term Goal 1 (Week 3): Pt will complete UB dressing with min assist and min cues for problem solving  OT Short Term Goal 1 - Progress (Week 3): Progressing toward goal OT Short Term Goal 2 (Week 3): Pt will complete toilet task with min assist  OT Short Term Goal 2 - Progress (Week 3): Met OT Short Term Goal  3 (Week 3): Pt will complete bathing with min assist and supervision cues for sequencing and problem solving OT Short Term Goal 3 - Progress (Week 3): Met OT Short Term Goal 4 (Week 3): Pt will demonstrate sustained attention to self-care task for 2 min with min cues OT Short Term Goal 4 - Progress (Week 3): Met Week 4:  OT Short Term Goal 1 (Week 4): Focus on LTGs  Skilled Therapeutic Interventions/Progress Updates:    Pt seen for ADL retraining with focus on functional transfers, standing balance, hemi dressing, and sustained attention. Pt received sitting in w/c. Competed stand pivot transfer w/c>toilet with min assist and min assist for standing balance as pt managed clothing down. Pt completed squat pivot transfer toilet>w/c with SBA. Completed bathing at shower level with pt problem solving washing RLE with increased time and no cues. Pt required min assist for standing balance during hygiene. Pt completed dressing from w/c level with much improved carryover of hemi dressing technique with increased time. Pt required setup assist for donning shirt for first time. Pt with much difficulty with verbal expression today, despite cues. Pt demonstrated improved awareness as he stated need to "shut up" when becoming frustrated. Pt left sitting in w/c with all needs in reach.   Therapy Documentation Precautions:  Precautions Precautions: Fall Precaution Comments: R hemi, expressive aphasia Restrictions Weight Bearing Restrictions: No Other Position/Activity Restrictions: no limitations given in chart regarding knee ROM or WB'ing, x-ray negative for bony abnormality General:   Vital Signs:   Pain: No report of pain during therapy session.   See FIM for current functional status  Therapy/Group: Individual Therapy  Waniya Hoglund, Quillian Quince 12/15/2013, 7:25 AM

## 2013-12-15 NOTE — Plan of Care (Signed)
Problem: RH Bed Mobility Goal: LTG Patient will perform bed mobility with assist (PT) LTG: Patient will perform bed mobility with assistance, with/without cues (PT).  goal upgraded 10/30 due to improved progress  Problem: RH Bed to Chair Transfers Goal: LTG Patient will perform bed/chair transfers w/assist (PT) LTG: Patient will perform bed/chair transfers with assistance, with/without cues (PT).  goal upgraded 10/30 due to improved progress

## 2013-12-15 NOTE — Progress Notes (Signed)
Speech Language Pathology Daily Session Note  Patient Details  Name: Bruce [name] MRN: [id number] Date of Birth: Sep 27, 1980  Today's Date: 12/15/2013 SLP Individual Time: 1430-1500 SLP Individual Time Calculation (min): 30 min  Short Term Goals: Week 4: SLP Short Term Goal 1 (Week 4): Patient will demonstrate selective attention to a structured task for 45 minutes with Max A multimodal cues.  SLP Short Term Goal 2 (Week 4): Patient will name functional items with Min A multimodal cues. SLP Short Term Goal 3 (Week 4): Patient will self-monitor and correct verbal errors with Mod A multimodal cues.  SLP Short Term Goal 4 (Week 4): Patient will consume Dys 3 textures with use of compensatory strategies with supervision  multimodal cues.  SLP Short Term Goal 5 (Week 4): Patient will demonstrate efficient mastication with trials of regular textures with minimal buccal pocketing with supervision multimodal cues.   Skilled Therapeutic Interventions: Skilled treatment session focused on dysphagia goals. SLP facilitated session by providing trials of regular textures. Patient demonstrated efficient mastication without overt s/s of aspiration and required intermittent verbal cues to self-monitor and correct right buccal pocketing.  Patient also required Max verbal cues for selective attention to self-feeding task in a moderately distracting environment. Patient left in the wheelchair with the quick release belt in place and all needs within reach. Continue with current plan of care.    FIM:  Comprehension Comprehension Mode: Auditory Comprehension: 4-Understands basic 75 - 89% of the time/requires cueing 10 - 24% of the time Expression Expression Mode: Verbal Expression: 3-Expresses basic 50 - 74% of the time/requires cueing 25 - 50% of the time. Needs to repeat parts of sentences. Social Interaction Social Interaction: 4-Interacts appropriately 75 - 89% of the time - Needs redirection for  appropriate language or to initiate interaction. Problem Solving Problem Solving: 4-Solves basic 75 - 89% of the time/requires cueing 10 - 24% of the time Memory Memory: 4-Recognizes or recalls 75 - 89% of the time/requires cueing 10 - 24% of the time FIM - Eating Eating Activity: 5: Needs verbal cues/supervision  Pain Pain Assessment Pain Assessment: No/denies pain  Therapy/Group: Individual Therapy  Lenorris Karger 12/15/2013, 4:04 PM

## 2013-12-15 NOTE — Progress Notes (Signed)
Speech Language Pathology Weekly Progress and Session Note  Patient Details  Name: Bruce [name] MRN: [id number] Date of Birth: July 24, 1980  Beginning of progress report period: December 08, 2013 End of progress report period: December 15, 2013  Today's Date: 12/15/2013 SLP Individual Time: 0930-1030 SLP Individual Time Calculation (min): 60 min  Short Term Goals: Week 3: SLP Short Term Goal 1 (Week 3): Patient will demonstrate selective attention to a structured task for 45 minutes with Min A multimodal cues.  SLP Short Term Goal 1 - Progress (Week 3): Not met SLP Short Term Goal 2 (Week 3): Patient will name functional items with Min A multimodal cues. SLP Short Term Goal 2 - Progress (Week 3): Not met SLP Short Term Goal 3 (Week 3): Patient will perfrom verbal, automatic tasks with Min A multimodal cues.  SLP Short Term Goal 3 - Progress (Week 3): Met SLP Short Term Goal 4 (Week 3): Patient will self-monitor and correct verbal errors with Mod A multimodal cues.  SLP Short Term Goal 4 - Progress (Week 3): Not met SLP Short Term Goal 5 (Week 3): Patient will consume Dys 2 textures with use of compensatory strategies with Min A multimodal cues.  SLP Short Term Goal 5 - Progress (Week 3): Met SLP Short Term Goal 6 (Week 3): Patient will demonstrate efficient mastication with trials of Dys. 3 textures with minimal buccal pocketing with supervision multimodal cues.  SLP Short Term Goal 6 - Progress (Week 3): Met    New Short Term Goals: Week 4: SLP Short Term Goal 1 (Week 4): Patient will demonstrate selective attention to a structured task for 45 minutes with Max A multimodal cues.  SLP Short Term Goal 2 (Week 4): Patient will name functional items with Min A multimodal cues. SLP Short Term Goal 3 (Week 4): Patient will self-monitor and correct verbal errors with Mod A multimodal cues.  SLP Short Term Goal 4 (Week 4): Patient will consume Dys 3 textures with use of compensatory  strategies with supervision  multimodal cues.  SLP Short Term Goal 5 (Week 4): Patient will demonstrate efficient mastication with trials of regular textures with minimal buccal pocketing with supervision multimodal cues.   Weekly Progress Updates: Patient has made functional gains and has met 3 out of 6 short term goals this reporting period due to improved ability to complete automatic verbal sequences and verbally express his wants/needs and swallowing function.  Currently, patient is consuming Dys. 3 textures with thin liquids without overt s/s of aspiration and requires intermittent supervision cues to self-monitor his right buccal pocketing.  Patient is currently consuming regular textures and demonstrates efficient mastication but requires cues to decreased verbosity while masticating. Patient also continues to require Mod A multimodal cues to perform functional and familiar cognitive tasks safely and his biggest barrier to progress at this time is his decreased attention and verbal impulsivity/verbosity during functional tasks. Patient also requires overall Mod assist for verbal expression in regards to naming tasks, expressing his wants/needs and ability to self-monitor and correct errors.  Patient and family education is ongoing and patient would benefit from continued skilled SLP intervention to maximize his cognitive-linguistic abilities and overall swallow function in order to maximize his functional independence prior to discharge.    Intensity: Minumum of 1-2 x/day, 30 to 90 minutes Frequency: 5 out of 7 days Duration/Length of Stay: TBD due to possbile SNF placement  Treatment/Interventions: Functional tasks;Patient/family education;Dysphagia/aspiration precaution training;Cueing hierarchy;Cognitive remediation/compensation;Environmental controls;Internal/external aids;Multimodal communication approach;Speech/Language facilitation;Therapeutic Activities  Daily Session Skilled  Therapeutic Interventions: Skilled treatment session focused on cognitive-linguistic goals. SLP facilitated session by providing Max A multimodal cues for selective attention to language tasks in a mildly distracting environment. SLP also facilitated session by providing Mod-Max A multimodal cues for patient to identify a functional item, write the name of the functional item and then say the name of the item aloud. Patient continues to demonstrate verbal impulsivity and decreased frustration tolerance which impacts his overall ability to self-monitor and correct verbal errors appopriately. Patient independently requested to use the bathroom and completed the task with Min A mulitmodal cues for problem solving. Patient left in his wheelchair with the quick release belt in place and all needs within reach. Continue with current plan of care.     FIM:  Comprehension Comprehension Mode: Auditory Comprehension: 4-Understands basic 75 - 89% of the time/requires cueing 10 - 24% of the time Expression Expression Mode: Verbal Expression: 3-Expresses basic 50 - 74% of the time/requires cueing 25 - 50% of the time. Needs to repeat parts of sentences. Social Interaction Social Interaction: 4-Interacts appropriately 75 - 89% of the time - Needs redirection for appropriate language or to initiate interaction. Problem Solving Problem Solving: 4-Solves basic 75 - 89% of the time/requires cueing 10 - 24% of the time Memory Memory: 4-Recognizes or recalls 75 - 89% of the time/requires cueing 10 - 24% of the time FIM - Eating Eating Activity: 5: Needs verbal cues/supervision Pain No/Denies Pain   Therapy/Group: Individual Therapy  Doralyn Kirkes, [place] 12/15/2013, 3:56 PM
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
## 2013-12-15 NOTE — Progress Notes (Signed)
Physical Therapy Session Note  Patient Details  Name: Bruce [name] MRN: [id number] Date of Birth: 1980-07-30  Today's Date: 12/15/2013 PT Individual Time: 0800-0900 and 1530-1630 PT Individual Time Calculation (min): 60 min and 60 min  Short Term Goals: Week 3:  PT Short Term Goal 1 (Week 3): STGs=LTGs  Skilled Therapeutic Interventions/Progress Updates:   AM Session: Patient received with RN in the room, brushing teeth while seated in WC. PT session focused on standing balance, dynamic sitting posture and balance, and NMR promoting R sided weight acceptance. Pt self-propelled 150' with supervision due to impulsivity and patient safety to rehab gym using LLE and LUE.   Patient performed standing soccer kicks with use of LLE to kick and RLE to accept body weigh; kicking activity chosen to address standing balance, postural control, and weight shift onto RLE. Patient initially needed HHA on L for additional support/confidence to perform the activity and progressed to decreased reliance on HHA; support provided at hips and RLE to assist with acceptance of weight bearing on RLE. Mod A cues provided throughout activity to attend to maintaining balance and posture.  Sitting balance, dynamic postural control, cognition, and attention were challenged with sitting on EOM and reaching towards letters and numbers arrayed on a bench 2' in front of the patient . Continued with identical reaching activity while leaning onto R elbow to promote UE weight acceptance; provided manual facilitation through RUE to increase weight bearing. Activity started with single step commands and progressed to 2-step commands. Patient required max A cues to accomplish 2-step commands due to decreased memory and attention to task in distracting environment. Patient returned to room upon end of session and [facility] and call bell in reach.   PM Session: Patient received sitting in  wheelchair. Session focused on wheelchair mobility, functional transfers, R UE/LE NMR, and stair negotiation. See below for NMR activity. Patient propelled wheelchair >150' with L hemi technique and supervision cues for obstacle negotiation and appropriate pacing. Functional transfers performed at close supervision level with set up assistance. Stair negotiation x3 stairs with L handrail and modA secondary to patient continuing manual facilitation for advancement and placement of R LE ascending and descending stairs. Patient able to verbally recall proper sequencing for stair negotiation. Patient positioned with B LEs off mat to facilitate B hip flexor stretch then progressed to x25 sit ups with arms across chest. Patient returned to room and left seated in wheelchair  and all needs within reach. Trial without quick release belt.  Therapy Documentation Precautions:  Precautions Precautions: Fall Precaution Comments: R hemi, expressive aphasia Restrictions Weight Bearing Restrictions: No Other Position/Activity Restrictions: no limitations given in chart regarding knee ROM or WB'ing, x-ray negative for bony abnormality Pain: Pain Assessment Pain Assessment: Faces Faces Pain Scale: Hurts little more (intermittent c/o pain in R knee) Locomotion : Ambulation Ambulation/Gait Assistance: Not tested (comment) Wheelchair Mobility Distance: 150  Other Treatments: Weight Bearing Technique Weight Bearing Technique: Yes RUE Weight Bearing Technique: High kneeling LUE Weight Bearing Technique: High kneeling Response to Weight Bearing Technique: Patient performed pre-gait activities in tall kneeling: R LE forward/retro stepping with manual facilitation to do so, verbal cues for L weight shift; mini squats in tall kneeling; L hand on R hand on kaye bench to increase weight bearing through R UE  See FIM for current functional status  Therapy/Group: Individual Therapy  Chipper HerbBridget S Lind Ausley S. Austyn Seier,  PT, DPT 12/15/2013, 4:49 PM

## 2013-12-16 ENCOUNTER — Inpatient Hospital Stay (HOSPITAL_COMMUNITY): Payer: Self-pay | Admitting: Physical Therapy

## 2013-12-16 NOTE — Progress Notes (Signed)
Speech Language Pathology Daily Session Note  Patient Details  Name: [name] MRN: [id number] Date of Birth: 1980/08/17  Today's Date: 12/16/2013 SLP Individual Time: 1200-1230 SLP Individual Time Calculation (min): 30 min  Short Term Goals: Week 4: SLP Short Term Goal 1 (Week 4): Patient will demonstrate selective attention to a structured task for 45 minutes with Max A multimodal cues.  SLP Short Term Goal 2 (Week 4): Patient will name functional items with Min A multimodal cues. SLP Short Term Goal 3 (Week 4): Patient will self-monitor and correct verbal errors with Mod A multimodal cues.  SLP Short Term Goal 4 (Week 4): Patient will consume Dys 3 textures with use of compensatory strategies with supervision  multimodal cues.  SLP Short Term Goal 5 (Week 4): Patient will demonstrate efficient mastication with trials of regular textures with minimal buccal pocketing with supervision multimodal cues.   Skilled Therapeutic Interventions: Skilled treatment session focused on dysphagia goals. SLP facilitated session by providing tray set-up during lunch meal of regular textures with thin liquids. Patient demonstrated efficient mastication without overt s/s of aspiration and was Mod I to self-monitor and correct right buccal pocketing.  Patient also demonstrated increased sustained attention to task with minimal verbal expression, therefore, recommend patient upgrade to regular textures with thin liquids with intermittent supervision. Patient left in the wheelchair with the quick release belt in place and all needs within reach. Continue with current plan of care.   FIM:  Comprehension Comprehension Mode: Auditory Comprehension: 4-Understands basic 75 - 89% of the time/requires cueing 10 - 24% of the time Expression Expression Mode: Verbal Expression: 3-Expresses basic 50 - 74% of the time/requires cueing 25 - 50% of the time. Needs to repeat parts of sentences. Social  Interaction Social Interaction: 4-Interacts appropriately 75 - 89% of the time - Needs redirection for appropriate language or to initiate interaction. Problem Solving Problem Solving: 3-Solves basic 50 - 74% of the time/requires cueing 25 - 49% of the time Memory Memory: 3-Recognizes or recalls 50 - 74% of the time/requires cueing 25 - 49% of the time  Pain Pain Assessment Pain Assessment: No/denies pain  Therapy/Group: Individual Therapy  Jakaya Jacobowitz 12/16/2013, 12:46 PM
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
## 2013-12-16 NOTE — Progress Notes (Signed)
Physical Therapy Session Note  Patient Details  Name: Bruce [name] MRN: [id number] Date of Birth: 1981/02/17  Today's Date: 12/16/2013 PT Individual Time: 0945-1100 PT Individual Time Calculation (min): 75 min   Short Term Goals: Week 1:  PT Short Term Goal 1 (Week 1): Pt will perform supine<>sit with min A with HOB flat using bed rail. PT Short Term Goal 1 - Progress (Week 1): Progressing toward goal PT Short Term Goal 2 (Week 1): Pt will transfer from bed<>chair (to both R and L sides) with min A and min cueing. PT Short Term Goal 2 - Progress (Week 1): Progressing toward goal PT Short Term Goal 3 (Week 1): Pt will peform static standing x30 seconds with mod A of single therapist. PT Short Term Goal 3 - Progress (Week 1): Met PT Short Term Goal 4 (Week 1): Pt will perform w/c mobility x50' with mod A and 50% cueing for attention to R side. PT Short Term Goal 4 - Progress (Week 1): Progressing toward goal PT Short Term Goal 5 (Week 1): Pt will perform gait x30' with +2Total A. PT Short Term Goal 5 - Progress (Week 1): Met Week 2:  PT Short Term Goal 1 (Week 2): Patient will perform bed mobility on flat surface without bed rails and with minA. PT Short Term Goal 1 - Progress (Week 2): Met PT Short Term Goal 2 (Week 2): Patient will perform functional transfers with minA. PT Short Term Goal 2 - Progress (Week 2): Progressing toward goal PT Short Term Goal 3 (Week 2): Patient will perform gait training x50' with modA. PT Short Term Goal 3 - Progress (Week 2): Progressing toward goal PT Short Term Goal 4 (Week 2): Patient will perform wheelchair mobility x50' with modA. PT Short Term Goal 4 - Progress (Week 2): Met PT Short Term Goal 5 (Week 2): Patient willl negotiate 3 steps with one handrail and modA. PT Short Term Goal 5 - Progress (Week 2): Progressing toward goal Week 3:  PT Short Term Goal 1 (Week 3): STGs=LTGs  Skilled Therapeutic Interventions/Progress Updates:    Pt most  limited in gait by decreased RLE clearance that is further decreased when fatigued. RLE clearance improves with theraband assist wrap, cues for core control, and hip flexor cueing. Pt able to clear in this manner but mostly through proximal compensation. Pt educated on forced use to maximize gains, able to safely reiterate later in session. Pt would continue to benefit from skilled PT services to increase functional mobility.  Therapy Documentation Precautions:  Precautions Precautions: Fall Precaution Comments: R hemi, expressive aphasia Restrictions Weight Bearing Restrictions: No Other Position/Activity Restrictions: no limitations given in chart regarding knee ROM or WB'ing, x-ray negative for bony abnormality Pain:  Pt denies in middle of session Mobility:  SBA for transfers with cues for attention and safety. Locomotion : Ambulation Ambulation/Gait Assistance: 3: Mod assist 14' and 25' with cues for sequencing, core activation, and hip flexor activation Balance: Dynamic Sitting Balance Dynamic Sitting - Level of Assistance: 5: Stand by assistance Dynamic Sitting Balance - Compensations: weight shifts and reaching Static Standing Balance Static Standing - Level of Assistance: 4: Min assist Dynamic Standing Balance Dynamic Standing - Balance Support: Right upper extremity supported Dynamic Standing - Level of Assistance: 3: Mod assist Dynamic Standing - Balance Activities: Reaching for objects;Lateral lean/weight shifting Dynamic Standing - Comments: and pre-gait activities Exercises:   Other Treatments:  Pre-gait with SBQC and with no AD 4x10 each using theraband assist wrap  including HS/quads. Pt performs sit to stands x25 in session. Pt performs static standing 1'x6. Pt performs weight shifting x20 in session. Sidelying PNF for LE performed with quick stretch and ventilatory cues. Pt performs seated anterior weight shifts with RUE forced use 5x10. Pt educated on deficits, rehab  prognosis, mass practice, and forced use to maximize gains.    See FIM for current functional status  Therapy/Group: Individual Therapy  Monia Pouch 12/16/2013, 10:51 AM

## 2013-12-17 ENCOUNTER — Inpatient Hospital Stay (HOSPITAL_COMMUNITY): Payer: Medicaid Other
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
## 2013-12-17 NOTE — Plan of Care (Signed)
Problem: RH SAFETY Goal: RH STG ADHERE TO SAFETY PRECAUTIONS W/ASSISTANCE/DEVICE STG Adhere to Safety Precautions With Min Assistance/Device.  Outcome: Progressing     

## 2013-12-17 NOTE — Progress Notes (Signed)
Physical Therapy Session Note  Patient Details  Name: Bruce [name] MRN: [id number] Date of Birth: 1980-03-21  Today's Date: 12/17/2013 PT Individual Time: 1100-1200 PT Individual Time Calculation (min): 60 min   Skilled Therapeutic Interventions/Progress Updates:  Patient sitting in wheelchair upon entering room. Patient able to get left LE onto leg rest, unlock brakes and propel self to gym with verbal cueing/coaxing. Patient transferred wheelchair <> mat and supine<>sit with supervision using left LE to assist right LE onto and off mat. Once on mat, patient stated that he needed to use the bathroom. Patient back to room and performed toileting tasks in standing using grab bar and leaning on wall for support with steady assist. Patient adjusted clothing prior to and after as well as performed hygiene. PT emptied urinal for patient. Patient back to gym. Patient ambulated with hemi walker 100 feet with mod assist using theraband wrap to facilitate ankle dorsiflexion and knee flexion during swing. Patient required max assist to progress and position right LE forward. Patient able to swing right LE forward about 20 % of the time with max facilitation and verbal cueing. Worked on sit to stand and weight shifting to right to facilitate increased weightbearing on right LE during gait. Patient propelled wheelchair back to room and was left in wheelchair with all items in reach. Patient expressed frustration at times regarding word finding difficulties with expressive aphasia.   Therapy Documentation Precautions:  Precautions Precautions: Fall Precaution Comments: R hemi, expressive aphasia Restrictions Weight Bearing Restrictions: No Other Position/Activity Restrictions: no limitations given in chart regarding knee ROM or WB'ing, x-ray negative for bony abnormality  Pain: Patient complained of headache upon entering room. Unable to assign number due to aphasia. Patient requesting more medication  from nursing. Nurse notified. Patient able to fully participate in therapy session without further complaints of pain.  Locomotion : Ambulation Ambulation/Gait Assistance: 3: Mod assist   See FIM for current functional status  Therapy/Group: Individual Therapy  Bruce [name], Bruce [name] 12/17/2013, 2:24 PM

## 2013-12-18 ENCOUNTER — Inpatient Hospital Stay (HOSPITAL_COMMUNITY): Payer: Medicaid Other

## 2013-12-18 ENCOUNTER — Inpatient Hospital Stay (HOSPITAL_COMMUNITY): Payer: Medicaid Other | Admitting: Speech Pathology

## 2013-12-18 ENCOUNTER — Inpatient Hospital Stay (HOSPITAL_COMMUNITY): Payer: Medicaid Other | Admitting: Occupational Therapy

## 2013-12-18 MED ORDER — TIZANIDINE HCL 4 MG PO TABS
4.0000 mg | ORAL_TABLET | Freq: Three times a day (TID) | ORAL | Status: DC
  Administered 2013-12-18 – 2013-12-19 (×3): 4 mg via ORAL
  Filled 2013-12-18 (×6): qty 1

## 2013-12-18 NOTE — Progress Notes (Signed)
Social Work Patient ID: Bruce [name], male   DOB: 04/02/1980, 33 y.o.   MRN: [id number]   [facility] [place]  Have discussed with pt and his sister and they are in agreement with planned move tomorrow.  Alerting tx team/ MD.  Plan transfer via ambulance (likely after lunch).  Pt expressing some anxiety about the move, but understands that this is the next step and that therapies will be continued at facility.  Ilianna Bown, LCSW

## 2013-12-18 NOTE — Progress Notes (Signed)
Occupational Therapy Session Note  Patient Details  Name: Bruce [name] MRN: [id number] Date of Birth: 1981-01-24  Today's Date: 12/18/2013 OT Individual Time: 1030-1140 OT Individual Time Calculation (min): 70 min    Short Term Goals: Week 3:  OT Short Term Goal 1 (Week 3): Pt will complete UB dressing with min assist and min cues for problem solving  OT Short Term Goal 1 - Progress (Week 3): Progressing toward goal OT Short Term Goal 2 (Week 3): Pt will complete toilet task with min assist  OT Short Term Goal 2 - Progress (Week 3): Met OT Short Term Goal 3 (Week 3): Pt will complete bathing with min assist and supervision cues for sequencing and problem solving OT Short Term Goal 3 - Progress (Week 3): Met OT Short Term Goal 4 (Week 3): Pt will demonstrate sustained attention to self-care task for 2 min with min cues OT Short Term Goal 4 - Progress (Week 3): Met  Skilled Therapeutic Interventions/Progress Updates:    Pt seen for ADL retraining with focus on functional transfers, cognitive remediation, standing balance, and R NMR. Pt received sitting in w/c requesting to toilet. Required min assist for transfers and standing balance during toilet task, bathing, and dressing. Pt required min cues for sustained attention to bathing for 20 min however required mod cues for sustained attention to dressing d/t internal distractions. Pt required no cues for hemi dressing. Completed oral care in standing at sink with total assist for use of RUE as stabilizer to apply toothpaste. Completed reaching activity in standing and stepping to facilitate weight bearing through RLE and RUE. Pt left sitting in w/c with friend present.   Therapy Documentation Precautions:  Precautions Precautions: Fall Precaution Comments: R hemi, expressive aphasia Restrictions Weight Bearing Restrictions: No Other Position/Activity Restrictions: no limitations given in chart regarding knee ROM or WB'ing, x-ray  negative for bony abnormality General:   Vital Signs: Therapy Vitals Pulse Rate: 64 BP: 119/70 mmHg Pain: Pain Assessment Pain Assessment: 0-10 Pain Score: 3  Faces Pain Scale: Hurts even more Pain Type: Chronic pain Pain Location: Leg Pain Orientation: Right Pain Descriptors / Indicators: Aching Pain Onset: On-going Pain Intervention(s): RN made aware;Distraction;Rest  See FIM for current functional status  Therapy/Group: Individual Therapy  Duayne Cal 12/18/2013, 11:49 AM

## 2013-12-18 NOTE — Discharge Summary (Signed)
Discharge summary job # [id number]

## 2013-12-18 NOTE — Progress Notes (Signed)
Occupational Therapy Session Note  Patient Details  Name: Bruce [name] MRN: [id number] Date of Birth: [birth year]  Today's Date: 12/18/2013 OT Individual Time: 1400-1500 OT Individual Time Calculation (min): 60 min    Short Term Goals: Week 4:  OT Short Term Goal 1 (Week 4): Focus on LTGs  Skilled Therapeutic Interventions/Progress Updates:    Engaged in therapeutic activity with focus on attention to task, standing balance, and RUE WB.  Pt received in w/c, upon questioning pt reported need to toilet.  Performed stand pivot to toilet with use of grab bar, where pt stood to void.  Pt required min/steady assist while standing when completing toileting tasks.  Propelled w/c to therapy gym with minimal talking.  In gym, engaged in seated card activity with focus on attention to task and appropriate word usage.  Pt with difficulty naming colors approx 50% of task, requiring phonemic cues.  Progressed to standing task with WB through RUE, also providing pt with tactile cues at Rt knee to promote equal WB in standing.  Required mod verbal cues to limit verbalizations, which tended to increase as he got frustrated with word finding difficulties.  Pt with questions regarding word finding difficulties and if he would ever get better, demonstrated intellectual awareness of deficits with regards to situation.    Therapy Documentation Precautions:  Precautions Precautions: Fall Precaution Comments: R hemi, expressive aphasia Restrictions Weight Bearing Restrictions: No Other Position/Activity Restrictions: no limitations given in chart regarding knee ROM or WB'ing, x-ray negative for bony abnormality General:   Vital Signs: Therapy Vitals Pulse Rate: 71 BP: 126/73 mmHg Pain:  Pt with no c/o pain ADL: ADL ADL Comments: see FIM  See FIM for current functional status  Therapy/Group: Individual Therapy  Rosalio LoudHOXIE, Terrina Docter 12/18/2013, 3:28 PM

## 2013-12-18 NOTE — Progress Notes (Addendum)
Physical Therapy Session Note  Patient Details  Name: Bruce [name] MRN: [id number] Date of Birth: 02/09/1981  Today's Date: 12/18/2013 PT Individual Time: 0800-0830 PT Individual Time Calculation (min): 30 min   Short Term Goals: Week 3:  PT Short Term Goal 1 (Week 3): STGs=LTGs  Skilled Therapeutic Interventions/Progress Updates:  1:1. Pt received brushing teeth at sink with nurse tech in room, PT taking over. Focus this session on functional transfers and mobility, awareness, and gait training. Pt req min A for toilet transfer and steadying assist while standing to void w/ use of grab bar. Pt req distant supervision for w/c propulsion 150'x2 with min cues to attend to task due to internal/external distractions. Pt with increased intellectual awareness of current impairments and questions regarding progress/recovery. Therapist educating pt regarding impairments, progress made, role of therapies and recovery overall. Pt appearing very down following conversation stating, "I just don't want to stay like this." Pt's care team made aware, emphasis for ongoing education and if pt appropriate to be assessed by neuropsych..   Pt req encouragement to physically engage in tx session following discussion. Pt amb 30'x2 with hemi-walker and mod A. Use of "rec wrap" to facilitate improved alignment of R LE as well as for gross clearance. Therapist providing  manual facilitation for increased IR, improved heel contact and gross advancement of R LE. Pt reporting increased pain during gait due to tone in hamstrings.   Pt left sitting in w/c at end of session w/ all needs in reach. RN aware of pain.   Therapy Documentation Precautions:  Precautions Precautions: Fall Precaution Comments: R hemi, expressive aphasia Restrictions Weight Bearing Restrictions: No Other Position/Activity Restrictions: no limitations given in chart regarding knee ROM or WB'ing, x-ray negative for bony abnormality Pain: Pain  Assessment Pain Assessment: Faces Faces Pain Scale: Hurts even more Pain Type: Chronic pain Pain Location: Leg Pain Orientation: Right Pain Descriptors / Indicators: Aching Pain Onset: On-going Pain Intervention(s): RN made aware;Distraction;Rest  See FIM for current functional status  Therapy/Group: Individual Therapy  [name], Tj Kitchings S 12/18/2013, 9:26 AM

## 2013-12-18 NOTE — Progress Notes (Signed)
Physical Therapy Session Note  Patient Details  Name: Bruce [name] MRN: [id number] Date of Birth: [birth year]  [name] Date: 12/18/2013 PT Individual Time: [id number] PT Individual Time Calculation (min): 60 min   Short Term Goals: Week 3:  PT Short Term Goal 1 (Week 3): STGs=LTGs  Skilled Therapeutic Interventions/Progress Updates:  Pt was found sitting up in w/c conversing with LCSW, regarding potential d/c to SNF in next few days. Focus this session on functional transfer and w/c propulsion, NMR in supine and gait training.  Pt req intermittent min cues throughout session for attention to tasks due to internal/external distractions. Pt self-propelled to rehab gym using L hemi technique in walker >150' with supervision, min cues to attend to obstacles. Pt transferred from w/c<>tx mat with supervision and performed bed mobility using supervision, cues for management of brakes.   Emphasis in L sidelying on R LE NMR with use powder board. Pt with complaints of pain during initial R LE PROM. Progression to assisted isolated hip flexion/extension with max-total manual facilitation. Noted trace activation of hip flexors during task.  Pt able to negotiate up/down 5 stairs use of L handrail, mod A for stability and management of R LE weight acceptance and advancement. Min cues req to remind pt of safe stepping pattern on stairs. Pt ambulated 70'X2 with hemiwalker on L, ace wrap for DF assist, and mod A to facilitate R LE advancement, IR, increased WB and weight shift.   Pt self-propelled back to his room >150' with supervision at the end of the session where he was left sitting in w/c with the nurse tech..  Therapy Documentation Precautions:  Precautions Precautions: Fall Precaution Comments: R hemi, expressive aphasia Restrictions Weight Bearing Restrictions: No Other Position/Activity Restrictions: no limitations given in chart regarding knee ROM or WB'ing, x-ray negative for bony  abnormality Pain: Pain Assessment Pain Assessment: Faces Faces Pain Scale: Hurts a little bit Pain Type: Chronic pain Pain Location: Leg Pain Orientation: Right Pain Descriptors / Indicators: Aching Pain Onset: On-going Pain Intervention([name]): Distraction  See FIM for current functional status  Therapy/Group: Individual Therapy  Bruce [name], Bruce [name] 12/18/2013, 5:34 PM
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
## 2013-12-18 NOTE — Progress Notes (Signed)
Speech Language Pathology Daily Session Note  Patient Details  Name: [name] MRN: [id number] Date of Birth: August 19, 1980  Today's Date: 12/18/2013 SLP Individual Time: 0935-1030 SLP Individual Time Calculation (min): 55 min  Short Term Goals: Week 4: SLP Short Term Goal 1 (Week 4): Patient will demonstrate selective attention to a structured task for 45 minutes with Max A multimodal cues.  SLP Short Term Goal 2 (Week 4): Patient will name functional items with Min A multimodal cues. SLP Short Term Goal 3 (Week 4): Patient will self-monitor and correct verbal errors with Mod A multimodal cues.  SLP Short Term Goal 4 (Week 4): Patient will consume Dys 3 textures with use of compensatory strategies with supervision  multimodal cues.  SLP Short Term Goal 5 (Week 4): Patient will demonstrate efficient mastication with trials of regular textures with minimal buccal pocketing with supervision multimodal cues.   Skilled Therapeutic Interventions: Skilled treatment session focused on cognitive-linguistic goals. Upon arrival, patient was sitting upright in the wheelchair with his shirt off.  Patient was able to donn his shirt with Mod I and extra time. SLP facilitated session by providing Mod A multimodal cues for selective attention to language tasks in a mildly distracting environment. SLP also facilitated session by providing Min A multimodal cues for patient to identify a functional item, Mod A to write the name of the functional item accurately and Max A multimodal cues to verbalize the name of the item. Patient demonstrated verbal impulsivity and decreased frustration tolerance which impacted his overall ability to self-monitor and correct verbal errors appropriately. Patient left in his wheelchair with the quick release belt in place and all needs within reach. Continue with current plan of care.   FIM:  Comprehension Comprehension Mode: Auditory Comprehension: 5-Understands basic 90% of the  time/requires cueing < 10% of the time Expression Expression Mode: Verbal Expression: 3-Expresses basic 50 - 74% of the time/requires cueing 25 - 50% of the time. Needs to repeat parts of sentences. Social Interaction Social Interaction: 4-Interacts appropriately 75 - 89% of the time - Needs redirection for appropriate language or to initiate interaction. Problem Solving Problem Solving: 4-Solves basic 75 - 89% of the time/requires cueing 10 - 24% of the time Memory Memory: 4-Recognizes or recalls 75 - 89% of the time/requires cueing 10 - 24% of the time  Pain Pain Assessment Pain Assessment: No/denies pain  Therapy/Group: Individual Therapy  Jasmeet Manton 12/18/2013, 4:58 PM
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
## 2013-12-19 ENCOUNTER — Encounter (HOSPITAL_COMMUNITY): Payer: Self-pay | Admitting: Emergency Medicine

## 2013-12-19 ENCOUNTER — Inpatient Hospital Stay (HOSPITAL_COMMUNITY): Payer: Self-pay

## 2013-12-19 ENCOUNTER — Inpatient Hospital Stay (HOSPITAL_COMMUNITY): Payer: Self-pay | Admitting: Speech Pathology

## 2013-12-19 ENCOUNTER — Encounter (HOSPITAL_COMMUNITY): Payer: Self-pay

## 2013-12-19 ENCOUNTER — Inpatient Hospital Stay (HOSPITAL_COMMUNITY): Payer: Medicaid Other | Admitting: *Deleted

## 2013-12-19 NOTE — Plan of Care (Signed)
Problem: RH Swallowing Goal: LTG Patient will demonstrate a functional change in (SLP) LTG: Patient will demonstrate a functional change in oral/oropharyngeal swallow as evidenced by an objective assessment (SLP)  Outcome: Completed/Met Date Met:  12/19/13  Problem: RH Cognition - SLP Goal: RH LTG Patient will demonstrate orientation with cues LTG: Patient will demonstrate orientation to person/place/time/situation with cues (SLP)  Outcome: Completed/Met Date Met:  12/19/13  Problem: RH Expression Communication Goal: LTG Patient will verbally express basic/complex needs (SLP) LTG: Patient will verbally express basic/complex needs, wants or ideas with cues (SLP)  Outcome: Completed/Met Date Met:  12/19/13  Problem: RH Problem Solving Goal: LTG Patient will demonstrate problem solving for (SLP) LTG: Patient will demonstrate problem solving for basic/complex daily situations with cues (SLP)  Outcome: Completed/Met Date Met:  12/19/13  Problem: RH Memory Goal: LTG Patient will use memory compensatory aids to (SLP) LTG: Patient will use memory compensatory aids to recall biographical/new, daily complex information with cues (SLP)  Outcome: Completed/Met Date Met:  12/19/13  Problem: RH Attention Goal: LTG Patient will demonstrate focused/sustained (SLP) LTG: Patient will demonstrate focused/sustained/selective/alternating/divided attention during cognitive/linguistic activities in specific environment with assist for # of minutes (SLP)  Outcome: Not Met (add Reason)  Problem: RH Awareness Goal: LTG: Patient will demonstrate intellectual/emergent (SLP) LTG: Patient will demonstrate intellectual/emergent/anticipatory awareness with assist during a cognitive/linguistic activity (SLP)  Outcome: Completed/Met Date Met:  12/19/13

## 2013-12-19 NOTE — Progress Notes (Signed)
Speech Language Pathology Daily Session Note  Patient Details  Name: Bruce [name] MRN: [id number] Date of Birth: [birth year]  Today's Date: 12/19/2013 SLP Individual Time: 0800-0900 SLP Individual Time Calculation (min): 60 min  Short Term Goals: Week 4: SLP Short Term Goal 1 (Week 4): Patient will demonstrate selective attention to a structured task for 45 minutes with Max A multimodal cues.  SLP Short Term Goal 2 (Week 4): Patient will name functional items with Min A multimodal cues. SLP Short Term Goal 3 (Week 4): Patient will self-monitor and correct verbal errors with Mod A multimodal cues.  SLP Short Term Goal 4 (Week 4): Patient will consume Dys 3 textures with use of compensatory strategies with supervision  multimodal cues.  SLP Short Term Goal 5 (Week 4): Patient will demonstrate efficient mastication with trials of regular textures with minimal buccal pocketing with supervision multimodal cues.   Skilled Therapeutic Interventions: Skilled treatment session focused on cognitive-linguistic goals. Upon arrival, patient was sitting upright in the wheelchair eating breakfast tray of regular textures with thin liquids. Patient did not demonstrate overt s/s of aspiration, however, required Mod A multimodal cues for sustained attention and decrease verbosity during task. Student facilitated session by providing Mod A multimodal cues for selective attention to language tasks in a mildly distracting environment. Student also facilitated session by providing Min A multimodal cues for patient to identify a functional item, Mod-Max A to write the name of the functional item accurately and Max A multimodal cues to verbalize the name of the item. Patient demonstrated verbal impulsivity and decreased frustration tolerance which impacted his overall ability to self-monitor and correct verbal errors appropriately. Patient was handed off to PT. Continue with current plan of care.    FIM:   Comprehension Comprehension Mode: Auditory Comprehension: 5-Understands basic 90% of the time/requires cueing < 10% of the time Expression Expression Mode: Verbal Expression: 3-Expresses basic 50 - 74% of the time/requires cueing 25 - 50% of the time. Needs to repeat parts of sentences. Social Interaction Social Interaction: 4-Interacts appropriately 75 - 89% of the time - Needs redirection for appropriate language or to initiate interaction. Problem Solving Problem Solving: 4-Solves basic 75 - 89% of the time/requires cueing 10 - 24% of the time Memory Memory: 4-Recognizes or recalls 75 - 89% of the time/requires cueing 10 - 24% of the time FIM - Eating Eating Activity: 5: Set-up assist for open containers  Pain Pain Assessment Pain Assessment: 0-10 Pain Score:  (unable to rate) Pain Type: Acute pain Pain Location: Head Pain Orientation: Right Pain Descriptors / Indicators: Aching Pain Onset: On-going Pain Intervention(s): Medication (See eMAR)  Therapy/Group: Individual Therapy  Bruce [name] 12/19/2013, 12:34 PM

## 2013-12-19 NOTE — Progress Notes (Signed)
Social Work  Discharge Note  The overall goal for the admission was met for:   Discharge location: Yes - d/c to SNF as was the plan upon admission to CIR  Length of Stay: Yes - 27 days  Discharge activity level: Yes - minimal to moderate assist overall  Home/community participation: Yes  Services provided included: MD, RD, PT, OT, SLP, RN, TR, Pharmacy and SW  Financial Services: Medicaid - PENDING*  Follow-up services arranged: Other: SNF @ [facility] (or additional information):  Also notified Ofc. Palmenteri with [place] [employer] of pt's transfer. Referred pt to [facility] for Community Case management.  Will keep sister updated once [name]'s case gets assisgned to CM.  Hoping that CM will assist with transition out of SNF when appropriate.  Patient/Family verbalized understanding of follow-up arrangements: Yes  Individual responsible for coordination of the follow-up plan: sister  Confirmed correct DME delivered: na    Bruce [name], Bruce [name]

## 2013-12-19 NOTE — Progress Notes (Signed)
Speech Language Pathology Discharge Summary  Patient Details  Name: Bruce [name] MRN: [id number] Date of Birth: 12-06-1980  Today's Date: 12/19/2013 SLP Individual Time: 1030-1100 SLP Individual Time Calculation (min): 30 min   Skilled Therapeutic Interventions:  Skilled treatment session focused on cognitive-linguistic goals. Student facilitated session by providing Min A multimodal cues for sustained attention and working memory during an auditory and reading comprehension task. Student also facilitated session by providing Mod-Max A multimodal cues for verbal expression and self-monitoring during automatic and categorical naming tasks. Patient's function is impacted by verbal impulsivity and verbosity and patient requires Mod A multimodal cues for redirection to tasks.    Patient has met 7 of 8 long term goals.  Patient to discharge at [facility] level.   Reasons goals not met: requires Mod A multimodal cues for selective attention   Clinical Impression/Discharge Summary: Patient has made functional gains and has met 7 out of 8 LTG's this admission due to improved ability to complete automatic verbal sequences and verbally express his wants/needs as well as increased problem solving, memory, orientation, awareness and swallowing function.  Currently, patient is consuming regular textures with thin liquids without overt s/s of aspiration and requires intermittent supervision cues to self-monitor his right buccal pocketing and decrease verbosity while masticating. 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 Patient currently demonstrates behaviors consistent with a Rancho Level VII and requires overall supervision cues for orientation and Min A multimodal cues for problem  solving with basic and familiar tasks, working memory and emergent awareness, however, patient demonstrates verbal impulsivity/verbosity during functional tasks and requires Mod A multimodal cues for sustained attention. Patient's family is unable to provide the necessary amount of physical and cognitive assistance needed at this time, therefore, patient will discharge to a SNF and would benefit from f/u SLP services to maximize his cognitive-linguistic abilities in order to reduce his overall burden of care and maximize his functional independence.    Care Partner:  Caregiver Able to Provide Assistance: No  Type of Caregiver Assistance: Physical;Cognitive  Recommendation:  24 hour supervision/assistance;Skilled Nursing facility  Rationale for SLP Follow Up: Maximize functional communication;Maximize cognitive function and independence;Reduce caregiver burden;Maximize swallowing safety   Equipment: N/A  Reasons for discharge: Discharged from hospital   Patient/Family Agrees with Progress Made and Goals Achieved: Yes   See FIM for current functional status  Bruce [name] 12/19/2013, 3:47 PM
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
## 2013-12-19 NOTE — Progress Notes (Signed)
Physical Therapy Discharge Summary  Patient Details  Name: Bruce [name] MRN: [id number] Date of Birth: [birth year]  Today's Date: 12/19/2013 PT Individual Time: 0900-0947 PT Individual Time Calculation (min): 47 min   Patient has met 8 of 9 long term goals due to improved activity tolerance, improved balance, improved postural control, ability to compensate for deficits, improved attention and improved awareness.  Patient to discharge at a wheelchair level Supervision and ambulation with mod A. Patient's care partner unavailable to complete family training secondary to patient d/c to SNF.  Reasons goals not met: Dynamic sitting balance goal not met due to poor safety awareness and impulsivity.  Recommendation:  Patient will benefit from ongoing skilled PT services in skilled nursing facility setting to continue to advance safe functional mobility, address ongoing impairments in motor strength, movement coordination, sensation, dynamic balance and posture, attention/cognition, functional use of R UE and LE, overall functional mobility, and minimize fall risk.  Equipment: No equipment provided  Reasons for discharge: treatment goals met and discharge from hospital  Patient/family agrees with progress made and goals achieved: Yes   Skilled intervention: Treatment focused on assessment of functional mobility and goals in preparation for pt discharge. See further details below.  PT Discharge Precautions/Restrictions Precautions Precautions: Fall Precaution Comments: R hemi, expressive aphasia Restrictions Weight Bearing Restrictions: No Other Position/Activity Restrictions: no limitations given in chart regarding knee ROM or WB'ing, x-ray negative for bony abnormality Pain Pain Assessment Pain Assessment: No/denies pain Cognition Overall Cognitive Status: Impaired/Different from baseline Arousal/Alertness: Awake/alert Orientation Level: Oriented X4 Attention: Sustained Focused  Attention: Appears intact Sustained Attention: Impaired Sustained Attention Impairment: Verbal basic;Functional basic Memory: Impaired Memory Impairment: Decreased recall of new information Awareness: Impaired Awareness Impairment: Emergent impairment Problem Solving: Impaired Problem Solving Impairment: Functional basic;Verbal basic Executive Function: Self Monitoring;Self Correcting;Organizing;Reasoning Reasoning: Impaired Reasoning Impairment: Verbal basic;Functional basic Organizing: Impaired Organizing Impairment: Verbal basic;Functional basic Self Monitoring: Impaired Self Monitoring Impairment: Verbal basic;Functional basic Self Correcting: Impaired Self Correcting Impairment: Verbal basic;Functional basic Behaviors: Impulsive;Poor frustration tolerance Safety/Judgment: Impaired Rancho [employer] Scales of Cognitive Functioning: Automatic/appropriate Sensation Sensation Light Touch: Impaired Detail Light Touch Impaired Details: Impaired RLE Proprioception: Impaired Detail Proprioception Impaired Details: Impaired RLE Additional Comments: proprioception intact at ankle, impaired at great toe Coordination Gross Motor Movements are Fluid and Coordinated: No Fine Motor Movements are Fluid and Coordinated: No Motor  Motor Motor: Hemiplegia;Abnormal tone;Abnormal postural alignment and control  Mobility Bed Mobility Bed Mobility: Supine to Sit;Sit to Supine Supine to Sit: 5: Supervision;HOB flat Supine to Sit Details: Verbal cues for precautions/safety Sitting - Scoot to Edge of Bed: 5: Supervision Sit to Supine: 5: Supervision;HOB flat Sit to Supine - Details: Verbal cues for precautions/safety;Verbal cues for sequencing Transfers Transfers: Yes Sit to Stand: 5: Supervision;From bed;From chair/3-in-1;With armrests;With upper extremity assist Sit to Stand Details: Verbal cues for precautions/safety;Verbal cues for sequencing Stand to Sit: 5: Supervision;To bed;To  chair/3-in-1;With upper extremity assist;With armrests Stand to Sit Details (indicate cue type and reason): Verbal cues for precautions/safety;Verbal cues for sequencing Squat Pivot Transfers: Not tested (comment) Locomotion  Ambulation Ambulation: Yes Ambulation/Gait Assistance: 3: Mod assist Ambulation Distance (Feet): 50 Feet Assistive device: Hemi-walker Ambulation/Gait Assistance Details: Verbal cues for precautions/safety;Verbal cues for gait pattern;Verbal cues for sequencing;Verbal cues for technique;Manual facilitation for placement;Verbal cues for safe use of DME/AE;Manual facilitation for weight bearing Ambulation/Gait Assistance Details: ambulation in controlled environment 50' x1 with hemiwalker and mod A for weight shift onto LLE, advancement and placement of RLE, weight acceptance on R,  and verbal reminders for correct hemiwalker sequencing pattern. Gait Gait: Yes Gait Pattern: Impaired Gait Pattern: Decreased step length - right;Decreased weight shift to right;Right genu recurvatum;Decreased hip/knee flexion - right;Decreased dorsiflexion - right;Decreased step length - left;Decreased stance time - right;Narrow base of support;Trunk rotated posteriorly on right;Right flexed knee in stance Stairs / Additional Locomotion Stairs: Yes Stairs Assistance: 3: Mod assist Stairs Assistance Details: Verbal cues for precautions/safety;Verbal cues for technique;Manual facilitation for weight shifting;Manual facilitation for placement;Manual facilitation for weight bearing Stairs Assistance Details (indicate cue type and reason): up/down 5 stairs with use of L handrail and mod A for RLE advancement, placement, and weight acceptance Stair Management Technique: One rail Left;Step to pattern;Forwards Number of Stairs: 5 Architect: Yes Wheelchair Assistance: 5: Investment banker, operational Details: Verbal cues for precautions/safety;Verbal cues for safe use  of DME/AE Wheelchair Propulsion: Left lower extremity;Left upper extremity Wheelchair Parts Management: Supervision/cueing Distance: 150' using L hemi technique and min verbal cue for safety  Trunk/Postural Assessment  Cervical Assessment Cervical Assessment: Within Functional Limits Postural Control Postural Control: Deficits on evaluation Righting Reactions: delayed Protective Responses: delayed  Balance Balance Balance Assessed: Yes Static Sitting Balance Static Sitting - Balance Support: Feet supported;Left upper extremity supported Static Sitting - Level of Assistance: 5: Stand by assistance Dynamic Sitting Balance Dynamic Sitting - Balance Support: Feet supported;During functional activity Dynamic Sitting - Level of Assistance: 5: Stand by assistance Dynamic Sitting Balance - Compensations: weight shifts and reaching Static Standing Balance Static Standing - Balance Support: Left upper extremity supported Static Standing - Level of Assistance: 5: Stand by assistance;4: Min assist Dynamic Standing Balance Dynamic Standing - Balance Support: During functional activity Dynamic Standing - Level of Assistance: 4: Min assist Dynamic Standing - Balance Activities: Reaching for objects;Lateral lean/weight shifting Dynamic Standing - Comments: managing clothing around waist and hygiene Extremity Assessment  RUE Assessment RUE Assessment: Exceptions to [facility] RUE Tone RUE Tone: Modified Ashworth Modified Ashworth Scale for Grading Hypertonia RUE: Slight increase in muscle tone, manifested by a catch, followed by minimal resistance throughout the remainder (less than half) of the ROM RLE Assessment RLE Assessment: Exceptions to [facility] RLE Strength RLE Overall Strength: Deficits RLE Overall Strength Comments: Grossly 1/5 overall RLE Tone RLE Tone: Hypertonic Modified Ashworth Scale for Grading Hypertonia RLE: Slight increase in muscle tone, manifested by a catch and release or by  minimal resistance at the end of the range of motion when the affected part(s) is moved in flexion or extension RLE Tone Comments: clonus in R gastroc sustained and soleus (4 beats)  LLE Assessment LLE Assessment: Within Functional Limits LLE Strength LLE Overall Strength: Within Functional Limits for tasks assessed  See FIM for current functional status  Lillia Abed. Trevaughn Schear, PT, DPT 12/19/2013, 5:29 PM

## 2013-12-19 NOTE — Progress Notes (Addendum)
Occupational Therapy Discharge Summary  Patient Details  Name: Bruce [name] MRN: [id number] Date of Birth: December 22, 1980   Patient has met 12 of 46 long term goals due to improved activity tolerance, improved balance, postural control, ability to compensate for deficits, improved attention and improved awareness.  Patient to discharge at overall min-supervision level.  Patient's care partner is independent to provide the necessary physical and cognitive assistance at discharge as patient discharging to SNF.    Reasons goals not met: Patient did not meet selective attention goals as the patient demonstrates impaired sustained attention, requiring min-max cues.  Recommendation:  Patient will benefit from ongoing skilled OT services in home health setting to continue to advance functional skills in the area of BADLs, balance, functional use of RUE/RLE, minimize fall risk, and cognition.  Equipment: No equipment provided  Reasons for discharge: treatment goals met and discharge from hospital  Patient/family agrees with progress made and goals achieved: Yes  OT Discharge Precautions/Restrictions  Precautions Precautions: Fall Precaution Comments: R hemi, expressive aphasia Restrictions Weight Bearing Restrictions: No General   Vital Signs   Pain Pain Assessment Pain Assessment: No/denies pain Pain Score: 7  Pain Type: Acute pain Pain Location: Leg Pain Orientation: Right Pain Descriptors / Indicators: Aching Pain Onset: On-going Pain Intervention(s): Medication (See eMAR) ADL ADL ADL Comments: see FIM Vision/Perception  Vision- History Baseline Vision/History: No visual deficits Patient Visual Report: No change from baseline Vision- Assessment Vision Assessment?: Yes Eye Alignment: Within Functional Limits Ocular Range of Motion: Within Functional Limits Tracking/Visual Pursuits: Able to track stimulus in all quads without difficulty Convergence: Within functional  limits Visual Fields: No apparent deficits Perception Comments: decreased awareness of RUE/RLE at times during functional mobility and self-care tasks  Cognition Overall Cognitive Status: Impaired/Different from baseline Arousal/Alertness: Awake/alert Orientation Level: Oriented X4 Attention: Sustained Focused Attention: Appears intact Sustained Attention: Impaired Sustained Attention Impairment: Verbal basic;Functional basic Memory: Impaired Memory Impairment: Decreased recall of new information Awareness: Impaired Awareness Impairment: Emergent impairment Problem Solving: Impaired Problem Solving Impairment: Functional basic;Verbal basic Executive Function: Self Monitoring;Self Correcting;Organizing;Reasoning Reasoning: Impaired Reasoning Impairment: Verbal basic;Functional basic Organizing: Impaired Organizing Impairment: Verbal basic;Functional basic Self Monitoring: Impaired Self Monitoring Impairment: Verbal basic;Functional basic Self Correcting: Impaired Self Correcting Impairment: Verbal basic;Functional basic Behaviors: Impulsive;Poor frustration tolerance Safety/Judgment: Impaired [employer] Scales of Cognitive Functioning: Automatic/appropriate Sensation Sensation Light Touch: Impaired Detail Light Touch Impaired Details: Impaired RLE Proprioception: Impaired Detail Proprioception Impaired Details: Impaired RLE Additional Comments: proprioception intact at ankle, impaired at great toe Coordination Gross Motor Movements are Fluid and Coordinated: No Fine Motor Movements are Fluid and Coordinated: No Motor  Motor Motor: Hemiplegia;Abnormal tone;Abnormal postural alignment and control Mobility  Bed Mobility Bed Mobility: Supine to Sit;Sit to Supine Supine to Sit: 5: Supervision;HOB flat Supine to Sit Details: Verbal cues for precautions/safety Sitting - Scoot to Edge of Bed: 5: Supervision Sitting - Scoot to [employer] of Bed Details: Tactile cues for weight  shifting;Visual cues/gestures for sequencing Sit to Supine: 5: Supervision;HOB flat Sit to Supine - Details: Verbal cues for precautions/safety;Verbal cues for sequencing Transfers Transfers: Sit to Stand;Stand to Sit Sit to Stand: 5: Supervision;From bed;From chair/3-in-1;With armrests;With upper extremity assist Stand to Sit: 5: Supervision;To bed;To chair/3-in-1;With upper extremity assist;With armrests Stand to Sit Details (indicate cue type and reason): Verbal cues for precautions/safety;Verbal cues for sequencing  Trunk/Postural Assessment  Cervical Assessment Cervical Assessment: Within Functional Limits Postural Control Postural Control: Deficits on evaluation Righting Reactions: delayed  Balance Balance Balance Assessed: Yes Static  Sitting Balance Static Sitting - Balance Support: Feet supported;Left upper extremity supported Static Sitting - Level of Assistance: 6: Modified independent (Device/Increase time) Dynamic Sitting Balance Dynamic Sitting - Balance Support: Feet supported;During functional activity Dynamic Sitting - Level of Assistance: 5: Stand by assistance Static Standing Balance Static Standing - Balance Support: Left upper extremity supported Static Standing - Level of Assistance: 5: Stand by assistance Dynamic Standing Balance Dynamic Standing - Balance Support: During functional activity Dynamic Standing - Level of Assistance: 4: Min assist Dynamic Standing - Comments: managing clothing around waist and hygiene Extremity/Trunk Assessment RUE Assessment RUE Assessment: Exceptions to [facility] RUE Tone RUE Tone: Modified Ashworth Modified Ashworth Scale for Grading Hypertonia RUE: Slight increase in muscle tone, manifested by a catch, followed by minimal resistance throughout the remainder (less than half) of the ROM LUE Assessment LUE Assessment: Within Functional Limits  See FIM for current functional status  Duayne Cal 12/19/2013, 2:43 PM

## 2013-12-19 NOTE — Progress Notes (Signed)
Occupational Therapy Session Note  Patient Details  Name: Bruce [name] MRN: [id number] Date of Birth: 08-05-1980  Today's Date: 12/19/2013 OT Individual Time: 1105-1205 OT Individual Time Calculation (min): 60 min    Short Term Goals: Week 3:  OT Short Term Goal 1 (Week 3): Pt will complete UB dressing with min assist and min cues for problem solving  OT Short Term Goal 1 - Progress (Week 3): Progressing toward goal OT Short Term Goal 2 (Week 3): Pt will complete toilet task with min assist  OT Short Term Goal 2 - Progress (Week 3): Met OT Short Term Goal 3 (Week 3): Pt will complete bathing with min assist and supervision cues for sequencing and problem solving OT Short Term Goal 3 - Progress (Week 3): Met OT Short Term Goal 4 (Week 3): Pt will demonstrate sustained attention to self-care task for 2 min with min cues OT Short Term Goal 4 - Progress (Week 3): Met  Skilled Therapeutic Interventions/Progress Updates:    Pt seen for ADL retraining with focus on sustained attention, standing balance, functional transfers, and safety awareness. Pt received sitting in w/c. Completed toilet task with steadying assist for standing balance and toilet transfer at supervision level with cues for setup. Pt completed bathing at shower level with no cues and steadying assist for standing balance. Pt completed dressing with more than reasonable amount of time and initiating hemi dressing without cues. Pt required mod cues for sustained attention to self-care tasks throughout session. Pt left sitting in w/c with all needs in reach.   Therapy Documentation Precautions:  Precautions Precautions: Fall Precaution Comments: R hemi, expressive aphasia Restrictions Weight Bearing Restrictions: No Other Position/Activity Restrictions: no limitations given in chart regarding knee ROM or WB'ing, x-ray negative for bony abnormality General:   Vital Signs:   Pain: No report of pain during therapy session.     Other Treatments:    See FIM for current functional status  Therapy/Group: Individual Therapy  Duayne Cal 12/19/2013, 12:09 PM

## 2013-12-19 NOTE — Plan of Care (Signed)
Problem: RH Swallowing Goal: LTG Patient will consume least restrictive PO diet (SLP) LTG: Patient will consume least restrictive PO diet with assist for use of compensatory strategies (SLP)  Outcome: Completed/Met Date Met:  12/19/13

## 2013-12-19 NOTE — Plan of Care (Signed)
Problem: RH Balance Goal: LTG Patient will maintain dynamic sitting balance (PT) LTG: Patient will maintain dynamic sitting balance with assistance during mobility activities (PT)  Outcome: Not Met (add Reason) Goal not met due to poor safety awareness and impulsivity Goal: LTG Patient will maintain dynamic standing balance (PT) LTG: Patient will maintain dynamic standing balance with assistance during mobility activities (PT)  Outcome: Completed/Met Date Met:  12/19/13  Problem: RH Bed Mobility Goal: LTG Patient will perform bed mobility with assist (PT) LTG: Patient will perform bed mobility with assistance, with/without cues (PT).  Outcome: Completed/Met Date Met:  12/19/13  Problem: RH Bed to Chair Transfers Goal: LTG Patient will perform bed/chair transfers w/assist (PT) LTG: Patient will perform bed/chair transfers with assistance, with/without cues (PT).  Outcome: Completed/Met Date Met:  12/19/13  Problem: RH Ambulation Goal: LTG Patient will ambulate in controlled environment (PT) LTG: Patient will ambulate in a controlled environment, # of feet with assistance (PT).  Outcome: Completed/Met Date Met:  12/19/13  Problem: RH Wheelchair Mobility Goal: LTG Patient will propel w/c in controlled environment (PT) LTG: Patient will propel wheelchair in controlled environment, # of feet with assist (PT)  Outcome: Completed/Met Date Met:  12/19/13  Problem: RH Stairs Goal: LTG Patient will ambulate up and down stairs w/assist (PT) LTG: Patient will ambulate up and down # of stairs with assistance (PT)  Outcome: Completed/Met Date Met:  12/19/13  Problem: RH Memory Goal: LTG Patient demonstrate ability for day to day recall (PT) LTG: Patient will demonstrate ability for day to day recall/carryover during mobility activities with assist (PT)  Outcome: Completed/Met Date Met:  12/19/13

## 2013-12-19 NOTE — Plan of Care (Signed)
Problem: RH Balance Goal: LTG: Patient will maintain dynamic sitting balance (OT) LTG: Patient will maintain dynamic sitting balance with assistance during activities of daily living (OT)  Outcome: Completed/Met Date Met:  12/19/13 Goal: LTG Patient will maintain dynamic standing with ADLs (OT) LTG: Patient will maintain dynamic standing balance with assist during activities of daily living (OT)  Outcome: Completed/Met Date Met:  12/19/13  Problem: RH Eating Goal: LTG Patient will perform eating w/assist, cues/equip (OT) LTG: Patient will perform eating with assist, with/without cues using equipment (OT)  Outcome: Completed/Met Date Met:  12/19/13  Problem: RH Grooming Goal: LTG Patient will perform grooming w/assist,cues/equip (OT) LTG: Patient will perform grooming with assist, with/without cues using equipment (OT)  Outcome: Completed/Met Date Met:  12/19/13  Problem: RH Bathing Goal: LTG Patient will bathe with assist, cues/equipment (OT) LTG: Patient will bathe specified number of body parts with assist with/without cues using equipment (position) (OT)  Outcome: Completed/Met Date Met:  12/19/13  Problem: RH Dressing Goal: LTG Patient will perform upper body dressing (OT) LTG Patient will perform upper body dressing with assist, with/without cues (OT).  Outcome: Completed/Met Date Met:  12/19/13 Goal: LTG Patient will perform lower body dressing w/assist (OT) LTG: Patient will perform lower body dressing with assist, with/without cues in positioning using equipment (OT)  Outcome: Completed/Met Date Met:  12/19/13  Problem: RH Toileting Goal: LTG Patient will perform toileting w/assist, cues/equip (OT) LTG: Patient will perform toiletiing (clothes management/hygiene) with assist, with/without cues using equipment (OT)  Outcome: Completed/Met Date Met:  12/19/13  Problem: RH Toilet Transfers Goal: LTG Patient will perform toilet transfers w/assist (OT) LTG: Patient will  perform toilet transfers with assist, with/without cues using equipment (OT)  Outcome: Completed/Met Date Met:  12/19/13  Problem: RH Tub/Shower Transfers Goal: LTG Patient will perform tub/shower transfers w/assist (OT) LTG: Patient will perform tub/shower transfers with assist, with/without cues using equipment (OT)  Outcome: Completed/Met Date Met:  12/19/13  Problem: RH Memory Goal: LTG Patient will demonstrate ability for day to day (OT) LTG: Patient will demonstrate ability for day to day recall/carryover during activities of daily living with assist (OT)  Outcome: Completed/Met Date Met:  12/19/13  Problem: RH Attention Goal: LTG Patient will demonstrate focused/sustained (OT) LTG: Patient will demonstrate focused/sustained/selective/alternating/divided attention during functional activities in specific environment with assist for # of minutes (OT)  Not met as patient demonstrates impaired sustained attention, requiring min-max cues.  Problem: RH Awareness Goal: LTG: Patient will demonstrate intellectual/emergent (OT) LTG: Patient will demonstrate intellectual/emergent/anticipatory awareness with assist during a functional activity (OT)  Outcome: Completed/Met Date Met:  12/19/13

## 2014-01-29 ENCOUNTER — Inpatient Hospital Stay: Payer: Self-pay | Admitting: Physical Medicine & Rehabilitation

## 2014-02-27 ENCOUNTER — Encounter: Payer: MEDICAID | Admitting: Registered Nurse

## 2014-03-07 ENCOUNTER — Encounter: Payer: Medicaid Other | Attending: Physical Medicine & Rehabilitation | Admitting: Physical Medicine & Rehabilitation

## 2014-03-07 ENCOUNTER — Telehealth: Payer: Self-pay | Admitting: *Deleted

## 2014-03-07 ENCOUNTER — Encounter: Payer: Self-pay | Admitting: Physical Medicine & Rehabilitation

## 2014-03-07 VITALS — BP 146/80 | HR 80 | Resp 14

## 2014-03-07 DIAGNOSIS — S069X9D Unspecified intracranial injury with loss of consciousness of unspecified duration, subsequent encounter: Secondary | ICD-10-CM | POA: Diagnosis present

## 2014-03-07 DIAGNOSIS — S069X0A Unspecified intracranial injury without loss of consciousness, initial encounter: Secondary | ICD-10-CM

## 2014-03-07 DIAGNOSIS — S069X9A Unspecified intracranial injury with loss of consciousness of unspecified duration, initial encounter: Secondary | ICD-10-CM

## 2014-03-07 DIAGNOSIS — R51 Headache: Secondary | ICD-10-CM | POA: Insufficient documentation

## 2014-03-07 DIAGNOSIS — X58XXXD Exposure to other specified factors, subsequent encounter: Secondary | ICD-10-CM | POA: Insufficient documentation

## 2014-03-07 DIAGNOSIS — G8111 Spastic hemiplegia affecting right dominant side: Secondary | ICD-10-CM

## 2014-03-07 DIAGNOSIS — G8110 Spastic hemiplegia affecting unspecified side: Secondary | ICD-10-CM

## 2014-03-07 DIAGNOSIS — S069X4S Unspecified intracranial injury with loss of consciousness of 6 hours to 24 hours, sequela: Secondary | ICD-10-CM

## 2014-03-07 DIAGNOSIS — R4701 Aphasia: Secondary | ICD-10-CM

## 2014-03-07 DIAGNOSIS — S0190XA Unspecified open wound of unspecified part of head, initial encounter: Secondary | ICD-10-CM

## 2014-03-07 MED ORDER — OXYCODONE-ACETAMINOPHEN 7.5-325 MG PO TABS: 1.0000 | ORAL_TABLET | Freq: Four times a day (QID) | ORAL | Status: DC | PRN

## 2014-03-07 NOTE — Patient Instructions (Signed)
RECOMMENDATIONS:  1. ZANAFLEX: (MUSCLE RELAXANT)----2MG  Q8 HOURS----HOLD FOR FATIGUE 2. IBUPROFEN: 800MG  Q8 PRN WITH FOOD FOR HEADACHES 3. INCREASE PERCOCET TO 7.5/.325 ONE Q6 PRN SEVERE HEADACHE. 4. CONTINUE RANGE OF MOTION AND STRETCHING IN BED AND CHAIR 3. FOCUS ON GAIT TECHNIQUE-----GOOD HEEL STRIKE, KNEE BEND AND LEG SWING RIGHT LEG 4. FOLLOW UP IN 6 WEEKS WITH ME------
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
## 2014-03-07 NOTE — Telephone Encounter (Signed)
Merita NortonSharonda called from [facility] stating that they will need a prescription for the percocet 7.5/325, 1 q 6hr  faxed over to her attention so that she can send to the pharmacy with an order to stop the 5/325 tid.  I have printed the rx and Riley KillSwartz has signed and sent the rx and the order to the fax number she has given me [phone].

## 2014-03-29 ENCOUNTER — Other Ambulatory Visit: Payer: Self-pay

## 2014-03-29 NOTE — Telephone Encounter (Signed)
Left message with Okley's sister to please give us a call to discuss Heaven's release from his facility and need for medication.  We are not allowed to speak to this friend concerning Nedim's situation.

## 2014-03-29 NOTE — Telephone Encounter (Signed)
Mr Ashraf's friend has called again and is saying [name] has been discharged from the [facility] and is needing medications.  We will not discuss this with his "friend".  I asked about his sister and the "friend" is saying that she has nothing to do with his care and his parents are deceased.  I have told him he must have an appt.to be seen by Dr Riley KillSwartz (there is one scheduled previously scheduled 04/18/14 ).  It is not clear how or why he would have been discharged from this center without family member and just to the care of a friend,  without medication, and as I would expect some notification to Dr Riley KillSwartz of his situation.  We wrote an rx for his percocet and fax[facility] to [facility]07/[facility] and therefore should not be out of medication.  I have sent a fax to the [facility] [place](Sharonda) asking for information on his status with the center and his medications and an explanation of his discharge.  The friend kept insisting he had [name] with him and asking to let [name] give us permission to discuss his medication but that was rejected as at this point it could be anyone on the phone.

## 2014-03-29 NOTE — Telephone Encounter (Signed)
Agree. Will need to be seen by a provider before any meds are dispensed.

## 2014-03-29 NOTE — Telephone Encounter (Signed)
Patients "friend" called on behalf of [name].  Stated that he left the center where he was because of rats, ETC, it was hard to understand either of them. He is out of medication now and was very upset.  I told them if he was hurting so much they needed to go to the closest ED for him to be checked out.  They told us that the center did not give him all of his pain meds and are asking for more medication -   Please advise.

## 2014-03-29 NOTE — Telephone Encounter (Signed)
Rockne Menghinidam Blake Clearence CheekFriend [phone]  Adam left a message on behalf of [name] and stated that [name] was no longer at the facility, and they were wanting to know what they need to do about getting refills on Conal pain medicine and muscle relaxers.

## 2014-03-30 NOTE — Telephone Encounter (Signed)
We received a fax back from [place]aronda at [facility] that says [facility] med[facility] on 03/20/14.  But this did not specify if he was given pain medication.  Per Dr Riley KillSwartz NO MEDS WITHOUT APPOINTMENT

## 2014-04-02 ENCOUNTER — Encounter (HOSPITAL_BASED_OUTPATIENT_CLINIC_OR_DEPARTMENT_OTHER): Payer: Self-pay | Admitting: *Deleted

## 2014-04-02 ENCOUNTER — Emergency Department (HOSPITAL_BASED_OUTPATIENT_CLINIC_OR_DEPARTMENT_OTHER)
Admission: EM | Admit: 2014-04-02 | Discharge: 2014-04-02 | Disposition: A | Discharge status: Home / Self Care | Payer: Medicaid Other | Attending: Emergency Medicine | Admitting: Emergency Medicine

## 2014-04-02 DIAGNOSIS — Z720 Tobacco use: Secondary | ICD-10-CM | POA: Insufficient documentation

## 2014-04-02 DIAGNOSIS — G8321 Monoplegia of upper limb affecting right dominant side: Secondary | ICD-10-CM | POA: Diagnosis not present

## 2014-04-02 DIAGNOSIS — R51 Headache: Secondary | ICD-10-CM | POA: Diagnosis not present

## 2014-04-02 DIAGNOSIS — Z87820 Personal history of traumatic brain injury: Secondary | ICD-10-CM | POA: Diagnosis not present

## 2014-04-02 DIAGNOSIS — Z8781 Personal history of (healed) traumatic fracture: Secondary | ICD-10-CM | POA: Diagnosis not present

## 2014-04-02 DIAGNOSIS — R4701 Aphasia: Secondary | ICD-10-CM | POA: Insufficient documentation

## 2014-04-02 DIAGNOSIS — G8911 Acute pain due to trauma: Secondary | ICD-10-CM | POA: Diagnosis not present

## 2014-04-02 DIAGNOSIS — R519 Headache, unspecified: Secondary | ICD-10-CM

## 2014-04-02 MED ORDER — OXYCODONE-ACETAMINOPHEN 5-325 MG PO TABS
ORAL_TABLET | ORAL | Status: AC
  Administered 2014-04-02: 2
  Filled 2014-04-02: qty 2

## 2014-04-02 MED ORDER — OXYCODONE-ACETAMINOPHEN 7.5-325 MG PO TABS: 1.0000 | ORAL_TABLET | Freq: Four times a day (QID) | ORAL | Status: DC | PRN
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
## 2014-04-02 NOTE — Discharge Instructions (Signed)
Percocet as prescribed.    follow-up with your primary doctor for future refills of pain medications.   Headaches, Frequently Asked Questions MIGRAINE HEADACHES Q: What is migraine? What causes it? How can I treat it? A: Generally, migraine headaches begin as a dull ache. Then they develop into a constant, throbbing, and pulsating pain. You may experience pain at the temples. You may experience pain at the front or back of one or both sides of the head. The pain is usually accompanied by a combination of:  Nausea.  Vomiting.  Sensitivity to light and noise. Some people (about 15%) experience an aura (see below) before an attack. The cause of migraine is believed to be chemical reactions in the brain. Treatment for migraine may include over-the-counter or prescription medications. It may also include self-help techniques. These include relaxation training and biofeedback.  Q: What is an aura? A: About 15% of people with migraine get an "aura". This is a sign of neurological symptoms that occur before a migraine headache. You may see wavy or jagged lines, dots, or flashing lights. You might experience tunnel vision or blind spots in one or both eyes. The aura can include visual or auditory hallucinations (something imagined). It may include disruptions in smell (such as strange odors), taste or touch. Other symptoms include:  Numbness.  A "pins and needles" sensation.  Difficulty in recalling or speaking the correct word. These neurological events may last as long as 60 minutes. These symptoms will fade as the headache begins. Q: What is a trigger? A: Certain physical or environmental factors can lead to or "trigger" a migraine. These include:  Foods.  Hormonal changes.  Weather.  Stress. It is important to remember that triggers are different for everyone. To help prevent migraine attacks, you need to figure out which triggers affect you. Keep a headache diary. This is a good way to  track triggers. The diary will help you talk to your healthcare professional about your condition. Q: Does weather affect migraines? A: Bright sunshine, hot, humid conditions, and drastic changes in barometric pressure may lead to, or "trigger," a migraine attack in some people. But studies have shown that weather does not act as a trigger for everyone with migraines. Q: What is the link between migraine and hormones? A: Hormones start and regulate many of your body's functions. Hormones keep your body in balance within a constantly changing environment. The levels of hormones in your body are unbalanced at times. Examples are during menstruation, pregnancy, or menopause. That can lead to a migraine attack. In fact, about three quarters of all women with migraine report that their attacks are related to the menstrual cycle.  Q: Is there an increased risk of stroke for migraine sufferers? A: The likelihood of a migraine attack causing a stroke is very remote. That is not to say that migraine sufferers cannot have a stroke associated with their migraines. In persons under age 35, the most common associated factor for stroke is migraine headache. But over the course of a person's normal life span, the occurrence of migraine headache may actually be associated with a reduced risk of dying from cerebrovascular disease due to stroke.  Q: What are acute medications for migraine? A: Acute medications are used to treat the pain of the headache after it has started. Examples over-the-counter medications, NSAIDs, ergots, and triptans.  Q: What are the triptans? A: Triptans are the newest class of abortive medications. They are specifically targeted to treat migraine. Triptans are  vasoconstrictors. They moderate some chemical reactions in the brain. The triptans work on receptors in your brain. Triptans help to restore the balance of a neurotransmitter called serotonin. Fluctuations in levels of serotonin are thought  to be a main cause of migraine.  Q: Are over-the-counter medications for migraine effective? A: Over-the-counter, or "OTC," medications may be effective in relieving mild to moderate pain and associated symptoms of migraine. But you should see your caregiver before beginning any treatment regimen for migraine.  Q: What are preventive medications for migraine? A: Preventive medications for migraine are sometimes referred to as "prophylactic" treatments. They are used to reduce the frequency, severity, and length of migraine attacks. Examples of preventive medications include antiepileptic medications, antidepressants, beta-blockers, calcium channel blockers, and NSAIDs (nonsteroidal anti-inflammatory drugs). Q: Why are anticonvulsants used to treat migraine? A: During the past few years, there has been an increased interest in antiepileptic drugs for the prevention of migraine. They are sometimes referred to as "anticonvulsants". Both epilepsy and migraine may be caused by similar reactions in the brain.  Q: Why are antidepressants used to treat migraine? A: Antidepressants are typically used to treat people with depression. They may reduce migraine frequency by regulating chemical levels, such as serotonin, in the brain.  Q: What alternative therapies are used to treat migraine? A: The term "alternative therapies" is often used to describe treatments considered outside the scope of conventional Western medicine. Examples of alternative therapy include acupuncture, acupressure, and yoga. Another common alternative treatment is herbal therapy. Some herbs are believed to relieve headache pain. Always discuss alternative therapies with your caregiver before proceeding. Some herbal products contain arsenic and other toxins. TENSION HEADACHES Q: What is a tension-type headache? What causes it? How can I treat it? A: Tension-type headaches occur randomly. They are often the result of temporary stress, anxiety,  fatigue, or anger. Symptoms include soreness in your temples, a tightening band-like sensation around your head (a "vice-like" ache). Symptoms can also include a pulling feeling, pressure sensations, and contracting head and neck muscles. The headache begins in your forehead, temples, or the back of your head and neck. Treatment for tension-type headache may include over-the-counter or prescription medications. Treatment may also include self-help techniques such as relaxation training and biofeedback. CLUSTER HEADACHES Q: What is a cluster headache? What causes it? How can I treat it? A: Cluster headache gets its name because the attacks come in groups. The pain arrives with little, if any, warning. It is usually on one side of the head. A tearing or bloodshot eye and a runny nose on the same side of the headache may also accompany the pain. Cluster headaches are believed to be caused by chemical reactions in the brain. They have been described as the most severe and intense of any headache type. Treatment for cluster headache includes prescription medication and oxygen. SINUS HEADACHES Q: What is a sinus headache? What causes it? How can I treat it? A: When a cavity in the bones of the face and skull (a sinus) becomes inflamed, the inflammation will cause localized pain. This condition is usually the result of an allergic reaction, a tumor, or an infection. If your headache is caused by a sinus blockage, such as an infection, you will probably have a fever. An x-ray will confirm a sinus blockage. Your caregiver's treatment might include antibiotics for the infection, as well as antihistamines or decongestants.  REBOUND HEADACHES Q: What is a rebound headache? What causes it? How can I treat it?  A: A pattern of taking acute headache medications too often can lead to a condition known as "rebound headache." A pattern of taking too much headache medication includes taking it more than 2 days per week or in  excessive amounts. That means more than the label or a caregiver advises. With rebound headaches, your medications not only stop relieving pain, they actually begin to cause headaches. Doctors treat rebound headache by tapering the medication that is being overused. Sometimes your caregiver will gradually substitute a different type of treatment or medication. Stopping may be a challenge. Regularly overusing a medication increases the potential for serious side effects. Consult a caregiver if you regularly use headache medications more than 2 days per week or more than the label advises. ADDITIONAL QUESTIONS AND ANSWERS Q: What is biofeedback? A: Biofeedback is a self-help treatment. Biofeedback uses special equipment to monitor your body's involuntary physical responses. Biofeedback monitors:  Breathing.  Pulse.  Heart rate.  Temperature.  Muscle tension.  Brain activity. Biofeedback helps you refine and perfect your relaxation exercises. You learn to control the physical responses that are related to stress. Once the technique has been mastered, you do not need the equipment any more. Q: Are headaches hereditary? A: Four out of five (80%) of people that suffer report a family history of migraine. Scientists are not sure if this is genetic or a family predisposition. Despite the uncertainty, a child has a 50% chance of having migraine if one parent suffers. The child has a 75% chance if both parents suffer.  Q: Can children get headaches? A: By the time they reach high school, most young people have experienced some type of headache. Many safe and effective approaches or medications can prevent a headache from occurring or stop it after it has begun.  Q: What type of doctor should I see to diagnose and treat my headache? A: Start with your primary caregiver. Discuss his or her experience and approach to headaches. Discuss methods of classification, diagnosis, and treatment. Your caregiver may  decide to recommend you to a headache specialist, depending upon your symptoms or other physical conditions. Having diabetes, allergies, etc., may require a more comprehensive and inclusive approach to your headache. The National Headache Foundation will provide, upon request, a list of [facility] physician members in your state. Document Released: 04/25/2003 Document Revised: 04/27/2011 Document Reviewed: 10/03/2007 [facility] Patient Information 2015 [place], [place]. This information is not intended to replace advice given to you by your health care provider. Make sure you discuss any questions you have with your health care provider.

## 2014-04-03 NOTE — Telephone Encounter (Signed)
Patient went to ED and was given 20 percocet q6h.. They are wanting a refill on pain meds

## 2014-04-03 NOTE — Telephone Encounter (Signed)
Patient's sister called again about patients medication.  Left [facility] because of the conditions there.

## 2014-04-04 NOTE — Telephone Encounter (Signed)
I have left a message for Ronnell's sister to call back. If [name] want to see Riley KillSwartz he can come in on Friday 04/06/14 for 9:40 appt per Dr Riley KillSwartz but he is not going to prescribe any narcotics and may not contiune with anything non narcotic as well.  Therefore it will be up to them if he wants this appt.

## 2014-04-06 NOTE — Telephone Encounter (Signed)
Spoke with F[facility] KneeSue from [facility] and she stated that when they D/C the patient after 2 months with ALL his medications.  The question is what happened when he went to the group home that he checked himself out of. Patient's sister did not call us back and they did not keep the appt for today.

## 2014-04-12 ENCOUNTER — Telehealth: Payer: Self-pay | Admitting: *Deleted

## 2014-04-12 NOTE — Telephone Encounter (Signed)
Sister called on behalf pt wondering what can be done to get pain medication for her brother. He has an appt on 04/18/2014 with Dr. Riley KillSwartz. I called the sister back and informed that pt needs to be seen in person by the provider in order to be considered. She said she understands and will be sure to get the pt in on Wednesday

## 2014-04-18 ENCOUNTER — Encounter: Payer: Medicaid Other | Attending: Physical Medicine & Rehabilitation | Admitting: Physical Medicine & Rehabilitation

## 2014-04-18 ENCOUNTER — Other Ambulatory Visit: Payer: Self-pay | Admitting: Physical Medicine & Rehabilitation

## 2014-04-18 ENCOUNTER — Encounter: Payer: Self-pay | Admitting: Physical Medicine & Rehabilitation

## 2014-04-18 VITALS — BP 138/74 | HR 83 | Resp 14

## 2014-04-18 DIAGNOSIS — S069XAA Unspecified intracranial injury with loss of consciousness status unknown, initial encounter: Secondary | ICD-10-CM

## 2014-04-18 DIAGNOSIS — S069X9D Unspecified intracranial injury with loss of consciousness of unspecified duration, subsequent encounter: Secondary | ICD-10-CM | POA: Insufficient documentation

## 2014-04-18 DIAGNOSIS — Z79899 Other long term (current) drug therapy: Secondary | ICD-10-CM

## 2014-04-18 DIAGNOSIS — S0190XA Unspecified open wound of unspecified part of head, initial encounter: Secondary | ICD-10-CM

## 2014-04-18 DIAGNOSIS — R51 Headache: Secondary | ICD-10-CM | POA: Diagnosis not present

## 2014-04-18 DIAGNOSIS — S069X4S Unspecified intracranial injury with loss of consciousness of 6 hours to 24 hours, sequela: Secondary | ICD-10-CM

## 2014-04-18 DIAGNOSIS — F1010 Alcohol abuse, uncomplicated: Secondary | ICD-10-CM

## 2014-04-18 DIAGNOSIS — Z5181 Encounter for therapeutic drug level monitoring: Secondary | ICD-10-CM

## 2014-04-18 DIAGNOSIS — S069X0A Unspecified intracranial injury without loss of consciousness, initial encounter: Secondary | ICD-10-CM

## 2014-04-18 DIAGNOSIS — R4701 Aphasia: Secondary | ICD-10-CM

## 2014-04-18 DIAGNOSIS — S069X9A Unspecified intracranial injury with loss of consciousness of unspecified duration, initial encounter: Secondary | ICD-10-CM

## 2014-04-18 MED ORDER — BACLOFEN 10 MG PO TABS: 10.0000 mg | ORAL_TABLET | Freq: Two times a day (BID) | ORAL | Status: DC

## 2014-04-18 MED ORDER — DIVALPROEX SODIUM 250 MG PO DR TAB: 250.0000 mg | DELAYED_RELEASE_TABLET | Freq: Two times a day (BID) | ORAL | Status: DC

## 2014-04-18 MED ORDER — OXYCODONE-ACETAMINOPHEN 7.5-325 MG PO TABS: 1.0000 | ORAL_TABLET | Freq: Four times a day (QID) | ORAL | Status: DC | PRN
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
## 2014-04-18 NOTE — Addendum Note (Signed)
Addended by: Angela NevinWESSLING, Rondrick Barreira D on: 04/18/2014 01:24 PM   Modules accepted: Orders

## 2014-04-18 NOTE — Patient Instructions (Addendum)
DEPAKOTE IS FOR BOTH YOUR MOOD AND YOUR HEADACHES  PERCOCET IS FOR PAIN  BACLOFEN IS FOR MUSCLE RELAXATION/SPASMS   Impingement Syndrome, Rotator Cuff, Bursitis with Rehab Impingement syndrome is a condition that involves inflammation of the tendons of the rotator cuff and the subacromial bursa, that causes pain in the shoulder. The rotator cuff consists of four tendons and muscles that control much of the shoulder and upper arm function. The subacromial bursa is a fluid filled sac that helps reduce friction between the rotator cuff and one of the bones of the shoulder (acromion). Impingement syndrome is usually an overuse injury that causes swelling of the bursa (bursitis), swelling of the tendon (tendonitis), and/or a tear of the tendon (strain). Strains are classified into three categories. Grade 1 strains cause pain, but the tendon is not lengthened. Grade 2 strains include a lengthened ligament, due to the ligament being stretched or partially ruptured. With grade 2 strains there is still function, although the function may be decreased. Grade 3 strains include a complete tear of the tendon or muscle, and function is usually impaired. SYMPTOMS   Pain around the shoulder, often at the outer portion of the upper arm.  Pain that gets worse with shoulder function, especially when reaching overhead or lifting.  Sometimes, aching when not using the arm.  Pain that wakes you up at night.  Sometimes, tenderness, swelling, warmth, or redness over the affected area.  Loss of strength.  Limited motion of the shoulder, especially reaching behind the back (to the back pocket or to unhook bra) or across your body.  Crackling sound (crepitation) when moving the arm.  Biceps tendon pain and inflammation (in the front of the shoulder). Worse when bending the elbow or lifting. CAUSES  Impingement syndrome is often an overuse injury, in which chronic (repetitive) motions cause the tendons or bursa to  become inflamed. A strain occurs when a force is paced on the tendon or muscle that is greater than it can withstand. Common mechanisms of injury include: Stress from sudden increase in duration, frequency, or intensity of training.  Direct hit (trauma) to the shoulder.  Aging, erosion of the tendon with normal use.  Bony bump on shoulder (acromial spur). RISK INCREASES WITH:  Contact sports (football, wrestling, boxing).  Throwing sports (baseball, tennis, volleyball).  Weightlifting and bodybuilding.  Heavy labor.  Previous injury to the rotator cuff, including impingement.  Poor shoulder strength and flexibility.  Failure to warm up properly before activity.  Inadequate protective equipment.  Old age.  Bony bump on shoulder (acromial spur). PREVENTION   Warm up and stretch properly before activity.  Allow for adequate recovery between workouts.  Maintain physical fitness:  Strength, flexibility, and endurance.  Cardiovascular fitness.  Learn and use proper exercise technique. PROGNOSIS  If treated properly, impingement syndrome usually goes away within 6 weeks. Sometimes surgery is required.  RELATED COMPLICATIONS   Longer healing time if not properly treated, or if not given enough time to heal.  Recurring symptoms, that result in a chronic condition.  Shoulder stiffness, frozen shoulder, or loss of motion.  Rotator cuff tendon tear.  Recurring symptoms, especially if activity is resumed too soon, with overuse, with a direct blow, or when using poor technique. TREATMENT  Treatment first involves the use of ice and medicine, to reduce pain and inflammation. The use of strengthening and stretching exercises may help reduce pain with activity. These exercises may be performed at home or with a therapist. If non-surgical treatment  is unsuccessful after more than 6 months, surgery may be advised. After surgery and rehabilitation, activity is usually possible in  3 months.  MEDICATION  If pain medicine is needed, nonsteroidal anti-inflammatory medicines (aspirin and ibuprofen), or other minor pain relievers (acetaminophen), are often advised.  Do not take pain medicine for 7 days before surgery.  Prescription pain relievers may be given, if your caregiver thinks they are needed. Use only as directed and only as much as you need.  Corticosteroid injections may be given by your caregiver. These injections should be reserved for the most serious cases, because they may only be given a certain number of times. HEAT AND COLD  Cold treatment (icing) should be applied for 10 to 15 minutes every 2 to 3 hours for inflammation and pain, and immediately after activity that aggravates your symptoms. Use ice packs or an ice massage.  Heat treatment may be used before performing stretching and strengthening activities prescribed by your caregiver, physical therapist, or athletic trainer. Use a heat pack or a warm water soak. SEEK MEDICAL CARE IF:   Symptoms get worse or do not improve in 4 to 6 weeks, despite treatment.  New, unexplained symptoms develop. (Drugs used in treatment may produce side effects.) EXERCISES  RANGE OF MOTION (ROM) AND STRETCHING EXERCISES - Impingement Syndrome (Rotator Cuff  Tendinitis, Bursitis) These exercises may help you when beginning to rehabilitate your injury. Your symptoms may go away with or without further involvement from your physician, physical therapist or athletic trainer. While completing these exercises, remember:   Restoring tissue flexibility helps normal motion to return to the joints. This allows healthier, less painful movement and activity.  An effective stretch should be held for at least 30 seconds.  A stretch should never be painful. You should only feel a gentle lengthening or release in the stretched tissue. STRETCH - Flexion, Standing  Stand with good posture. With an underhand grip on your right /  left hand, and an overhand grip on the opposite hand, grasp a broomstick or cane so that your hands are a little more than shoulder width apart.  Keeping your right / left elbow straight and shoulder muscles relaxed, push the stick with your opposite hand, to raise your right / left arm in front of your body and then overhead. Raise your arm until you feel a stretch in your right / left shoulder, but before you have increased shoulder pain.  Try to avoid shrugging your right / left shoulder as your arm rises, by keeping your shoulder blade tucked down and toward your mid-back spine. Hold for __________ seconds.  Slowly return to the starting position. Repeat __________ times. Complete this exercise __________ times per day. STRETCH - Abduction, Supine  Lie on your back. With an underhand grip on your right / left hand and an overhand grip on the opposite hand, grasp a broomstick or cane so that your hands are a little more than shoulder width apart.  Keeping your right / left elbow straight and your shoulder muscles relaxed, push the stick with your opposite hand, to raise your right / left arm out to the side of your body and then overhead. Raise your arm until you feel a stretch in your right / left shoulder, but before you have increased shoulder pain.  Try to avoid shrugging your right / left shoulder as your arm rises, by keeping your shoulder blade tucked down and toward your mid-back spine. Hold for __________ seconds.  Slowly return  to the starting position. Repeat __________ times. Complete this exercise __________ times per day. ROM - Flexion, Active-Assisted  Lie on your back. You may bend your knees for comfort.  Grasp a broomstick or cane so your hands are about shoulder width apart. Your right / left hand should grip the end of the stick, so that your hand is positioned "thumbs-up," as if you were about to shake hands.  Using your healthy arm to lead, raise your right / left arm  overhead, until you feel a gentle stretch in your shoulder. Hold for __________ seconds.  Use the stick to assist in returning your right / left arm to its starting position. Repeat __________ times. Complete this exercise __________ times per day.  ROM - Internal Rotation, Supine   Lie on your back on a firm surface. Place your right / left elbow about 60 degrees away from your side. Elevate your elbow with a folded towel, so that the elbow and shoulder are the same height.  Using a broomstick or cane and your strong arm, pull your right / left hand toward your body until you feel a gentle stretch, but no increase in your shoulder pain. Keep your shoulder and elbow in place throughout the exercise.  Hold for __________ seconds. Slowly return to the starting position. Repeat __________ times. Complete this exercise __________ times per day. STRETCH - Internal Rotation  Place your right / left hand behind your back, palm up.  Throw a towel or belt over your opposite shoulder. Grasp the towel with your right / left hand.  While keeping an upright posture, gently pull up on the towel, until you feel a stretch in the front of your right / left shoulder.  Avoid shrugging your right / left shoulder as your arm rises, by keeping your shoulder blade tucked down and toward your mid-back spine.  Hold for __________ seconds. Release the stretch, by lowering your healthy hand. Repeat __________ times. Complete this exercise __________ times per day. ROM - Internal Rotation   Using an underhand grip, grasp a stick behind your back with both hands.  While standing upright with good posture, slide the stick up your back until you feel a mild stretch in the front of your shoulder.  Hold for __________ seconds. Slowly return to your starting position. Repeat __________ times. Complete this exercise __________ times per day.  STRETCH - Posterior Shoulder Capsule   Stand or sit with good posture.  Grasp your right / left elbow and draw it across your chest, keeping it at the same height as your shoulder.  Pull your elbow, so your upper arm comes in closer to your chest. Pull until you feel a gentle stretch in the back of your shoulder.  Hold for __________ seconds. Repeat __________ times. Complete this exercise __________ times per day. STRENGTHENING EXERCISES - Impingement Syndrome (Rotator Cuff Tendinitis, Bursitis) These exercises may help you when beginning to rehabilitate your injury. They may resolve your symptoms with or without further involvement from your physician, physical therapist or athletic trainer. While completing these exercises, remember:  Muscles can gain both the endurance and the strength needed for everyday activities through controlled exercises.  Complete these exercises as instructed by your physician, physical therapist or athletic trainer. Increase the resistance and repetitions only as guided.  You may experience muscle soreness or fatigue, but the pain or discomfort you are trying to eliminate should never worsen during these exercises. If this pain does get worse, stop and  make sure you are following the directions exactly. If the pain is still present after adjustments, discontinue the exercise until you can discuss the trouble with your clinician.  During your recovery, avoid activity or exercises which involve actions that place your injured hand or elbow above your head or behind your back or head. These positions stress the tissues which you are trying to heal. STRENGTH - Scapular Depression and Adduction   With good posture, sit on a firm chair. Support your arms in front of you, with pillows, arm rests, or on a table top. Have your elbows in line with the sides of your body.  Gently draw your shoulder blades down and toward your mid-back spine. Gradually increase the tension, without tensing the muscles along the top of your shoulders and the back of  your neck.  Hold for __________ seconds. Slowly release the tension and relax your muscles completely before starting the next repetition.  After you have practiced this exercise, remove the arm support and complete the exercise in standing as well as sitting position. Repeat __________ times. Complete this exercise __________ times per day.  STRENGTH - Shoulder Abductors, Isometric  With good posture, stand or sit about 4-6 inches from a wall, with your right / left side facing the wall.  Bend your right / left elbow. Gently press your right / left elbow into the wall. Increase the pressure gradually, until you are pressing as hard as you can, without shrugging your shoulder or increasing any shoulder discomfort.  Hold for __________ seconds.  Release the tension slowly. Relax your shoulder muscles completely before you begin the next repetition. Repeat __________ times. Complete this exercise __________ times per day.  STRENGTH - External Rotators, Isometric  Keep your right / left elbow at your side and bend it 90 degrees.  Step into a door frame so that the outside of your right / left wrist can press against the door frame without your upper arm leaving your side.  Gently press your right / left wrist into the door frame, as if you were trying to swing the back of your hand away from your stomach. Gradually increase the tension, until you are pressing as hard as you can, without shrugging your shoulder or increasing any shoulder discomfort.  Hold for __________ seconds.  Release the tension slowly. Relax your shoulder muscles completely before you begin the next repetition. Repeat __________ times. Complete this exercise __________ times per day.  STRENGTH - Supraspinatus   Stand or sit with good posture. Grasp a __________ weight, or an exercise band or tubing, so that your hand is "thumbs-up," like you are shaking hands.  Slowly lift your right / left arm in a "V" away from  your thigh, diagonally into the space between your side and straight ahead. Lift your hand to shoulder height or as far as you can, without increasing any shoulder pain. At first, many people do not lift their hands above shoulder height.  Avoid shrugging your right / left shoulder as your arm rises, by keeping your shoulder blade tucked down and toward your mid-back spine.  Hold for __________ seconds. Control the descent of your hand, as you slowly return to your starting position. Repeat __________ times. Complete this exercise __________ times per day.  STRENGTH - External Rotators  Secure a rubber exercise band or tubing to a fixed object (table, pole) so that it is at the same height as your right / left elbow when you are standing  or sitting on a firm surface.  Stand or sit so that the secured exercise band is at your uninjured side.  Bend your right / left elbow 90 degrees. Place a folded towel or small pillow under your right / left arm, so that your elbow is a few inches away from your side.  Keeping the tension on the exercise band, pull it away from your body, as if pivoting on your elbow. Be sure to keep your body steady, so that the movement is coming only from your rotating shoulder.  Hold for __________ seconds. Release the tension in a controlled manner, as you return to the starting position. Repeat __________ times. Complete this exercise __________ times per day.  STRENGTH - Internal Rotators   Secure a rubber exercise band or tubing to a fixed object (table, pole) so that it is at the same height as your right / left elbow when you are standing or sitting on a firm surface.  Stand or sit so that the secured exercise band is at your right / left side.  Bend your elbow 90 degrees. Place a folded towel or small pillow under your right / left arm so that your elbow is a few inches away from your side.  Keeping the tension on the exercise band, pull it across your body,  toward your stomach. Be sure to keep your body steady, so that the movement is coming only from your rotating shoulder.  Hold for __________ seconds. Release the tension in a controlled manner, as you return to the starting position. Repeat __________ times. Complete this exercise __________ times per day.  STRENGTH - Scapular Protractors, Standing   Stand arms length away from a wall. Place your hands on the wall, keeping your elbows straight.  Begin by dropping your shoulder blades down and toward your mid-back spine.  To strengthen your protractors, keep your shoulder blades down, but slide them forward on your rib cage. It will feel as if you are lifting the back of your rib cage away from the wall. This is a subtle motion and can be challenging to complete. Ask your caregiver for further instruction, if you are not sure you are doing the exercise correctly.  Hold for __________ seconds. Slowly return to the starting position, resting the muscles completely before starting the next repetition. Repeat __________ times. Complete this exercise __________ times per day. STRENGTH - Scapular Protractors, Supine  Lie on your back on a firm surface. Extend your right / left arm straight into the air while holding a __________ weight in your hand.  Keeping your head and back in place, lift your shoulder off the floor.  Hold for __________ seconds. Slowly return to the starting position, and allow your muscles to relax completely before starting the next repetition. Repeat __________ times. Complete this exercise __________ times per day. STRENGTH - Scapular Protractors, Quadruped  Get onto your hands and knees, with your shoulders directly over your hands (or as close as you can be, comfortably).  Keeping your elbows locked, lift the back of your rib cage up into your shoulder blades, so your mid-back rounds out. Keep your neck muscles relaxed.  Hold this position for __________ seconds.  Slowly return to the starting position and allow your muscles to relax completely before starting the next repetition. Repeat __________ times. Complete this exercise __________ times per day.  STRENGTH - Scapular Retractors  Secure a rubber exercise band or tubing to a fixed object (table, pole), so that  it is at the height of your shoulders when you are either standing, or sitting on a firm armless chair.  With a palm down grip, grasp an end of the band in each hand. Straighten your elbows and lift your hands straight in front of you, at shoulder height. Step back, away from the secured end of the band, until it becomes tense.  Squeezing your shoulder blades together, draw your elbows back toward your sides, as you bend them. Keep your upper arms lifted away from your body throughout the exercise.  Hold for __________ seconds. Slowly ease the tension on the band, as you reverse the directions and return to the starting position. Repeat __________ times. Complete this exercise __________ times per day. STRENGTH - Shoulder Extensors   Secure a rubber exercise band or tubing to a fixed object (table, pole) so that it is at the height of your shoulders when you are either standing, or sitting on a firm armless chair.  With a thumbs-up grip, grasp an end of the band in each hand. Straighten your elbows and lift your hands straight in front of you, at shoulder height. Step back, away from the secured end of the band, until it becomes tense.  Squeezing your shoulder blades together, pull your hands down to the sides of your thighs. Do not allow your hands to go behind you.  Hold for __________ seconds. Slowly ease the tension on the band, as you reverse the directions and return to the starting position. Repeat __________ times. Complete this exercise __________ times per day.  STRENGTH - Scapular Retractors and External Rotators   Secure a rubber exercise band or tubing to a fixed object (table,  pole) so that it is at the height as your shoulders, when you are either standing, or sitting on a firm armless chair.  With a palm down grip, grasp an end of the band in each hand. Bend your elbows 90 degrees and lift your elbows to shoulder height, at your sides. Step back, away from the secured end of the band, until it becomes tense.  Squeezing your shoulder blades together, rotate your shoulders so that your upper arms and elbows remain stationary, but your fists travel upward to head height.  Hold for __________ seconds. Slowly ease the tension on the band, as you reverse the directions and return to the starting position. Repeat __________ times. Complete this exercise __________ times per day.  STRENGTH - Scapular Retractors and External Rotators, Rowing   Secure a rubber exercise band or tubing to a fixed object (table, pole) so that it is at the height of your shoulders, when you are either standing, or sitting on a firm armless chair.  With a palm down grip, grasp an end of the band in each hand. Straighten your elbows and lift your hands straight in front of you, at shoulder height. Step back, away from the secured end of the band, until it becomes tense.  Step 1: Squeeze your shoulder blades together. Bending your elbows, draw your hands to your chest, as if you are rowing a boat. At the end of this motion, your hands and elbow should be at shoulder height and your elbows should be out to your sides.  Step 2: Rotate your shoulders, to raise your hands above your head. Your forearms should be vertical and your upper arms should be horizontal.  Hold for __________ seconds. Slowly ease the tension on the band, as you reverse the directions and return to  the starting position. Repeat __________ times. Complete this exercise __________ times per day.  STRENGTH - Scapular Depressors  Find a sturdy chair without wheels, such as a dining room chair.  Keeping your feet on the floor, and  your hands on the chair arms, lift your bottom up from the seat, and lock your elbows.  Keeping your elbows straight, allow gravity to pull your body weight down. Your shoulders will rise toward your ears.  Raise your body against gravity by drawing your shoulder blades down your back, shortening the distance between your shoulders and ears. Although your feet should always maintain contact with the floor, your feet should progressively support less body weight, as you get stronger.  Hold for __________ seconds. In a controlled and slow manner, lower your body weight to begin the next repetition. Repeat __________ times. Complete this exercise __________ times per day.  Document Released: 02/02/2005 Document Revised: 04/27/2011 Document Reviewed: 05/17/2008 [facility] Patient Information 2015 [place], [place]. This information is not intended to replace advice given to you by your health care provider. Make sure you discuss any questions you have with your health care provider.

## 2014-04-19 LAB — PMP ALCOHOL METABOLITE (ETG): Ethyl Glucuronide (EtG): NEGATIVE ng/mL

## 2014-04-22 LAB — OXYCODONE, URINE (LC/MS-MS)
Noroxycodone, Ur: 2065 ng/mL (ref <50)
Oxycodone, ur: 1090 ng/mL (ref <50)
Oxymorphone: 134 ng/mL (ref <50)

## 2014-04-22 LAB — OPIATES/OPIOIDS (LC/MS-MS)
Codeine Urine: NEGATIVE ng/mL (ref <50)
HYDROMORPHONE: NEGATIVE ng/mL (ref <50)
Hydrocodone: 206 ng/mL — AB (ref <50)
MORPHINE: NEGATIVE ng/mL (ref <50)
NOROXYCODONE, UR: 2065 ng/mL (ref <50)
Norhydrocodone, Ur: 595 ng/mL — AB (ref <50)
OXYCODONE, UR: 1090 ng/mL (ref <50)
Oxymorphone: 134 ng/mL (ref <50)

## 2014-04-24 ENCOUNTER — Telehealth: Payer: Self-pay | Admitting: Physical Medicine & Rehabilitation

## 2014-04-24 DIAGNOSIS — M62838 Other muscle spasm: Secondary | ICD-10-CM

## 2014-04-24 DIAGNOSIS — G894 Chronic pain syndrome: Secondary | ICD-10-CM

## 2014-04-24 DIAGNOSIS — S069X0S Unspecified intracranial injury without loss of consciousness, sequela: Secondary | ICD-10-CM

## 2014-04-24 LAB — PRESCRIPTION MONITORING PROFILE (SOLSTAS)
AMPHETAMINE/METH: NEGATIVE ng/mL
BARBITURATE SCREEN, URINE: NEGATIVE ng/mL
BUPRENORPHINE, URINE: NEGATIVE ng/mL
Benzodiazepine Screen, Urine: NEGATIVE ng/mL
CANNABINOID SCRN UR: NEGATIVE ng/mL
Carisoprodol, Urine: NEGATIVE ng/mL
Cocaine Metabolites: NEGATIVE ng/mL
Creatinine, Urine: 114.31 mg/dL (ref >20.0)
Fentanyl, Ur: NEGATIVE ng/mL
MDMA URINE: NEGATIVE ng/mL
MEPERIDINE UR: NEGATIVE ng/mL
Methadone Screen, Urine: NEGATIVE ng/mL
NITRITES URINE, INITIAL: NEGATIVE ug/mL
PROPOXYPHENE: NEGATIVE ng/mL
TAPENTADOLUR: NEGATIVE ng/mL
Tramadol Scrn, Ur: NEGATIVE ng/mL
Zolpidem, Urine: NEGATIVE ng/mL
pH, Initial: 5.5 pH (ref 4.5–8.9)

## 2014-04-24 NOTE — Telephone Encounter (Signed)
Yes continue narcs , he has not received narcs from me before this visit. UDS at next visit. If unprescribed medication present again---that's the end of narc rx'es here.

## 2014-04-24 NOTE — Telephone Encounter (Signed)
If patient takes unprescribed drugs (ie hydrocodone) while receiving narcotics from me, he will become IMMEDIATELY a non-narcotic patient

## 2014-04-24 NOTE — Telephone Encounter (Signed)
So you are saying he is still going to continue to receive narcotics? Are we going to drug test him again when he sees [place] 05/17/14 since here was unprescribed medication present in the 3/2/156 test? She will need to be clear on this.

## 2014-05-04 NOTE — Progress Notes (Signed)
Urine drug screen for this encounter is positve for unprescribed hydrocodone as well as oxycodone.  This has been discussed and Bruce [name] has Bruce Blazingbeen warned about taking anything unprescribed by Bruce [name] will result in non narc tx.

## 2014-05-17 ENCOUNTER — Encounter (HOSPITAL_BASED_OUTPATIENT_CLINIC_OR_DEPARTMENT_OTHER): Payer: Medicaid Other | Admitting: Registered Nurse

## 2014-05-17 ENCOUNTER — Encounter: Payer: Self-pay | Admitting: Registered Nurse

## 2014-05-17 ENCOUNTER — Other Ambulatory Visit: Payer: Self-pay | Admitting: Registered Nurse

## 2014-05-17 VITALS — BP 139/87 | HR 82 | Resp 14

## 2014-05-17 DIAGNOSIS — S069X4S Unspecified intracranial injury with loss of consciousness of 6 hours to 24 hours, sequela: Secondary | ICD-10-CM | POA: Diagnosis not present

## 2014-05-17 DIAGNOSIS — Z79899 Other long term (current) drug therapy: Secondary | ICD-10-CM | POA: Diagnosis not present

## 2014-05-17 DIAGNOSIS — Z5181 Encounter for therapeutic drug level monitoring: Secondary | ICD-10-CM

## 2014-05-17 DIAGNOSIS — G894 Chronic pain syndrome: Secondary | ICD-10-CM

## 2014-05-17 DIAGNOSIS — S069X9D Unspecified intracranial injury with loss of consciousness of unspecified duration, subsequent encounter: Secondary | ICD-10-CM | POA: Diagnosis not present

## 2014-05-17 MED ORDER — OXYCODONE-ACETAMINOPHEN 7.5-325 MG PO TABS: 1.0000 | ORAL_TABLET | Freq: Four times a day (QID) | ORAL | Status: DC | PRN
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
## 2014-05-18 LAB — PMP ALCOHOL METABOLITE (ETG): Ethyl Glucuronide (EtG): NEGATIVE ng/mL

## 2014-05-21 LAB — OXYCODONE, URINE (LC/MS-MS)
NOROXYCODONE, UR: 1475 ng/mL (ref <50)
OXYCODONE, UR: 549 ng/mL (ref <50)
OXYMORPHONE, URINE: 155 ng/mL (ref <50)

## 2014-05-22 LAB — PRESCRIPTION MONITORING PROFILE (SOLSTAS)
AMPHETAMINE/METH: NEGATIVE ng/mL
BUPRENORPHINE, URINE: NEGATIVE ng/mL
Barbiturate Screen, Urine: NEGATIVE ng/mL
Benzodiazepine Screen, Urine: NEGATIVE ng/mL
Cannabinoid Scrn, Ur: NEGATIVE ng/mL
Carisoprodol, Urine: NEGATIVE ng/mL
Cocaine Metabolites: NEGATIVE ng/mL
Creatinine, Urine: 90.47 mg/dL (ref >20.0)
ECSTASY: NEGATIVE ng/mL
FENTANYL URINE: NEGATIVE ng/mL
METHADONE SCREEN, URINE: NEGATIVE ng/mL
Meperidine, Ur: NEGATIVE ng/mL
NITRITES URINE, INITIAL: NEGATIVE ug/mL
Opiate Screen, Urine: NEGATIVE ng/mL
PH URINE, INITIAL: 5.2 pH (ref 4.5–8.9)
PROPOXYPHENE: NEGATIVE ng/mL
TAPENTADOLUR: NEGATIVE ng/mL
TRAMADOL UR: NEGATIVE ng/mL
Zolpidem, Urine: NEGATIVE ng/mL

## 2014-05-22 NOTE — Telephone Encounter (Signed)
Spoke with Dr. Riley KillSwartz regarding the concerns voiced by [name] sister Bruce [name]. We will order Home Health Therapy, Social work, Physical Therapy, Speech Therapy and Home Health Aide.  Left Message on [name]'s Voice Mail. Not sure how much Medicaid will approve.Instructed to call if she has any questions.

## 2014-05-30 NOTE — Progress Notes (Signed)
Urine drug screen for this encounter is consistent for prescribed medication 

## 2014-06-13 ENCOUNTER — Encounter: Payer: Self-pay | Admitting: Registered Nurse

## 2014-06-13 ENCOUNTER — Encounter: Payer: Medicaid Other | Attending: Physical Medicine & Rehabilitation | Admitting: Registered Nurse

## 2014-06-13 VITALS — BP 127/77 | HR 68 | Resp 14

## 2014-06-13 DIAGNOSIS — Z5181 Encounter for therapeutic drug level monitoring: Secondary | ICD-10-CM

## 2014-06-13 DIAGNOSIS — M6249 Contracture of muscle, multiple sites: Secondary | ICD-10-CM | POA: Diagnosis not present

## 2014-06-13 DIAGNOSIS — R51 Headache: Secondary | ICD-10-CM | POA: Insufficient documentation

## 2014-06-13 DIAGNOSIS — G8110 Spastic hemiplegia affecting unspecified side: Secondary | ICD-10-CM | POA: Diagnosis not present

## 2014-06-13 DIAGNOSIS — Z79899 Other long term (current) drug therapy: Secondary | ICD-10-CM

## 2014-06-13 DIAGNOSIS — S069X4S Unspecified intracranial injury with loss of consciousness of 6 hours to 24 hours, sequela: Secondary | ICD-10-CM

## 2014-06-13 DIAGNOSIS — G894 Chronic pain syndrome: Secondary | ICD-10-CM

## 2014-06-13 DIAGNOSIS — S069X9D Unspecified intracranial injury with loss of consciousness of unspecified duration, subsequent encounter: Secondary | ICD-10-CM | POA: Diagnosis not present

## 2014-06-13 DIAGNOSIS — M62838 Other muscle spasm: Secondary | ICD-10-CM

## 2014-06-13 DIAGNOSIS — G8111 Spastic hemiplegia affecting right dominant side: Secondary | ICD-10-CM

## 2014-06-13 MED ORDER — OXYCODONE-ACETAMINOPHEN 7.5-325 MG PO TABS: 1.0000 | ORAL_TABLET | Freq: Four times a day (QID) | ORAL | Status: DC | PRN

## 2014-06-13 MED ORDER — METHOCARBAMOL 500 MG PO TABS: 500.0000 mg | ORAL_TABLET | Freq: Three times a day (TID) | ORAL | Status: DC | PRN
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
## 2014-06-13 NOTE — Patient Instructions (Signed)
[name]:  The Speech Therapist and Physical Therapist Called on 06/11/2014   Awaiting on your call back. [phone]

## 2014-06-14 ENCOUNTER — Ambulatory Visit: Payer: Self-pay | Admitting: Registered Nurse

## 2014-07-03 ENCOUNTER — Ambulatory Visit: Payer: Medicaid Other | Attending: Registered Nurse

## 2014-07-03 ENCOUNTER — Ambulatory Visit: Payer: Medicaid Other

## 2014-07-03 ENCOUNTER — Telehealth: Payer: Self-pay | Admitting: Registered Nurse

## 2014-07-03 DIAGNOSIS — R269 Unspecified abnormalities of gait and mobility: Secondary | ICD-10-CM | POA: Diagnosis not present

## 2014-07-03 DIAGNOSIS — R482 Apraxia: Secondary | ICD-10-CM | POA: Diagnosis not present

## 2014-07-03 DIAGNOSIS — R4701 Aphasia: Secondary | ICD-10-CM | POA: Diagnosis not present

## 2014-07-03 DIAGNOSIS — R29898 Other symptoms and signs involving the musculoskeletal system: Secondary | ICD-10-CM | POA: Diagnosis not present

## 2014-07-03 DIAGNOSIS — R41841 Cognitive communication deficit: Secondary | ICD-10-CM | POA: Diagnosis not present

## 2014-07-03 DIAGNOSIS — S069X4A Unspecified intracranial injury with loss of consciousness of 6 hours to 24 hours, initial encounter: Secondary | ICD-10-CM
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
## 2014-07-03 NOTE — Telephone Encounter (Signed)
Physical Therapist evaluated [name] today. She felt he would benefit from Occupational Therapy. Ordered Placed today
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
## 2014-07-03 NOTE — Patient Instructions (Signed)
  We will submit to Medicaid and see what kind of visits they give us.  Suggest [facility] if Medicaid does not provide what you are looking for in therapy frequency Phone [phone]

## 2014-07-04 ENCOUNTER — Telehealth: Payer: Self-pay

## 2014-07-04 NOTE — Telephone Encounter (Signed)
PT left message for pt's sister/or pt to call back with hospital discharge date and date he [facility]. Conan BowensLisa Long needs this information to submit PT and speech treatment approvals.

## 2014-07-11 ENCOUNTER — Encounter: Payer: Self-pay | Admitting: Physical Medicine & Rehabilitation

## 2014-07-11 ENCOUNTER — Encounter: Payer: Medicaid Other | Attending: Physical Medicine & Rehabilitation | Admitting: Physical Medicine & Rehabilitation

## 2014-07-11 VITALS — BP 134/79 | HR 79 | Resp 14

## 2014-07-11 DIAGNOSIS — S069X9D Unspecified intracranial injury with loss of consciousness of unspecified duration, subsequent encounter: Secondary | ICD-10-CM | POA: Diagnosis present

## 2014-07-11 DIAGNOSIS — S069X0S Unspecified intracranial injury without loss of consciousness, sequela: Secondary | ICD-10-CM | POA: Diagnosis not present

## 2014-07-11 DIAGNOSIS — F918 Other conduct disorders: Secondary | ICD-10-CM

## 2014-07-11 DIAGNOSIS — R51 Headache: Secondary | ICD-10-CM | POA: Diagnosis not present

## 2014-07-11 DIAGNOSIS — G8110 Spastic hemiplegia affecting unspecified side: Secondary | ICD-10-CM

## 2014-07-11 DIAGNOSIS — G8111 Spastic hemiplegia affecting right dominant side: Secondary | ICD-10-CM

## 2014-07-11 DIAGNOSIS — R4689 Other symptoms and signs involving appearance and behavior: Secondary | ICD-10-CM

## 2014-07-11 MED ORDER — OXYCODONE-ACETAMINOPHEN 7.5-325 MG PO TABS: 1.0000 | ORAL_TABLET | Freq: Four times a day (QID) | ORAL | Status: DC | PRN

## 2014-07-11 MED ORDER — QUETIAPINE FUMARATE 25 MG PO TABS: 25.0000 mg | ORAL_TABLET | Freq: Two times a day (BID) | ORAL | Status: DC

## 2014-07-11 NOTE — Patient Instructions (Signed)
PLEASE CALL ME WITH ANY PROBLEMS OR QUESTIONS (#[phone]).     CONSIDER TAKING HIM TO BEHAVIORAL HEALTH IF HIS BEHAVIOR ESCALATES.
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
## 2014-07-24 ENCOUNTER — Ambulatory Visit: Payer: Medicaid Other | Attending: Registered Nurse

## 2014-07-24 ENCOUNTER — Ambulatory Visit: Payer: Medicaid Other | Admitting: Occupational Therapy

## 2014-07-24 DIAGNOSIS — R41840 Attention and concentration deficit: Secondary | ICD-10-CM | POA: Insufficient documentation

## 2014-07-24 DIAGNOSIS — R4189 Other symptoms and signs involving cognitive functions and awareness: Secondary | ICD-10-CM | POA: Insufficient documentation

## 2014-07-24 DIAGNOSIS — S069X5A Unspecified intracranial injury with loss of consciousness greater than 24 hours with return to pre-existing conscious level, initial encounter: Secondary | ICD-10-CM | POA: Insufficient documentation

## 2014-07-24 DIAGNOSIS — R482 Apraxia: Secondary | ICD-10-CM | POA: Diagnosis present

## 2014-07-24 DIAGNOSIS — H539 Unspecified visual disturbance: Secondary | ICD-10-CM

## 2014-07-24 DIAGNOSIS — R279 Unspecified lack of coordination: Secondary | ICD-10-CM

## 2014-07-24 DIAGNOSIS — G8111 Spastic hemiplegia affecting right dominant side: Secondary | ICD-10-CM

## 2014-07-24 DIAGNOSIS — R269 Unspecified abnormalities of gait and mobility: Secondary | ICD-10-CM | POA: Insufficient documentation

## 2014-07-24 DIAGNOSIS — X58XXXA Exposure to other specified factors, initial encounter: Secondary | ICD-10-CM | POA: Insufficient documentation

## 2014-07-24 DIAGNOSIS — S069X0S Unspecified intracranial injury without loss of consciousness, sequela: Secondary | ICD-10-CM | POA: Diagnosis present

## 2014-07-24 DIAGNOSIS — G8110 Spastic hemiplegia affecting unspecified side: Secondary | ICD-10-CM | POA: Diagnosis present

## 2014-07-24 DIAGNOSIS — R29898 Other symptoms and signs involving the musculoskeletal system: Secondary | ICD-10-CM | POA: Diagnosis present

## 2014-07-24 DIAGNOSIS — R4701 Aphasia: Secondary | ICD-10-CM | POA: Insufficient documentation
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
## 2014-07-24 NOTE — Patient Instructions (Addendum)
  Perform all balance exercises in a corner with a chair in front of you.   Copyright  VHI. All rights reserved.  Single Leg - Eyes Open   Holding support, lift right leg while maintaining balance over other leg. Progress to removing hands from support surface for longer periods of time. Repeat with other leg lifted. Hold_10-30___ seconds. Repeat __3__ times per session. Do __1__ sessions per day.  Copyright  VHI. All rights reserved.  Feet Together, Head Motion - Eyes Open   With eyes open, feet together, move head slowly: up and down and side to side for 30 seconds. Keep shoulders still. Repeat __3__ times per session. Do __1__ sessions per day.  Copyright  VHI. All rights reserved.  Side to Side Head Motion   Perform without assistive device BUT hold onto counter. Walking on solid surface, turn head and eyes to left for __2__ steps. Then, turn head and eyes straight ahead for __2__ steps. Then, turn head and eyes to opposite side for __2__ steps. Repeat sequence __4__ times per session. Do ___1_ sessions per day.   Copyright  VHI. All rights reserved.  Backward   Walk backwards with eyes open, while holding onto counter. Take 10 even steps, making sure each foot lifts off floor. Repeat for __4__ times per session. Do __1__ sessions per day.   Copyright  VHI. All rights reserved.  Side-Stepping   Walk to left side with eyes open, while holding counter. Take 10 even steps, leading with same foot. Make sure each foot lifts off the floor. Repeat in opposite direction. Repeat for __4__ times per session. Do __1__ sessions per day.   Copyright  VHI. All rights reserved.

## 2014-07-25 ENCOUNTER — Encounter: Payer: Self-pay | Admitting: Occupational Therapy
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
## 2014-08-02 ENCOUNTER — Ambulatory Visit: Payer: Medicaid Other

## 2014-08-02 ENCOUNTER — Encounter: Payer: Self-pay | Admitting: Physical Therapy

## 2014-08-02 ENCOUNTER — Ambulatory Visit: Payer: Medicaid Other | Admitting: Physical Therapy

## 2014-08-02 DIAGNOSIS — R4701 Aphasia: Secondary | ICD-10-CM

## 2014-08-02 DIAGNOSIS — R269 Unspecified abnormalities of gait and mobility: Secondary | ICD-10-CM | POA: Diagnosis not present

## 2014-08-02 DIAGNOSIS — R482 Apraxia: Secondary | ICD-10-CM

## 2014-08-02 DIAGNOSIS — R29898 Other symptoms and signs involving the musculoskeletal system: Secondary | ICD-10-CM
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
## 2014-08-02 NOTE — Patient Instructions (Signed)
  Please complete the assigned speech therapy homework and return it to your next session.  

## 2014-08-02 NOTE — Patient Instructions (Signed)
Bridging   Slowly raise buttocks from floor, keeping stomach tight. Repeat ____ times per set. Do ____ sets per session. Do ____ sessions per day.  http://orth.exer.us/1096   Copyright  VHI. All rights reserved.  Straight Leg Raise   Tighten stomach and slowly raise locked right leg _3-4___ inches from floor. HYold for 5 seconds. Repeat __10__ times per set. Do __1-2__ sets per session. Do 1-2 sessions per day.  http://orth.exer.us/1102   Copyright  VHI. All rights reserved.  Bridging   Slowly raise buttocks from floor, keeping stomach tight. Hold for 5 seconds. Repeat __10__ times per set. Do _1-2___ sets per session. Do _1-2__ sessions per day.  http://orth.exer.us/1096   Copyright  VHI. All rights reserved.  Bilateral Isometric Hip Flexion   Tighten stomach and raise both knees to outstretched left arm. Push gently with left hand on both knees, keeping arms straight, trunk rigid, BREATHE!! Hold __5__ seconds, do not hold your breath, BREATHE!! Repeat _5-10___ times per set. Do 1_ sets per session. Do __1-2__ sessions per day.  http://orth.exer.us/1100   Copyright  VHI. All rights reserved.  Hip Flexor Stretch   Lying on back near edge of bed with right side on the edge, bend both legs, feet flat. Lift right leg off bed and then back onto bed, keeping the knee bent. Do 10 reps. 1-2 sets. 1-2 times per day.  http://gt2.exer.us/346   Copyright  VHI. All rights reserved.  Functional Quadriceps: Sit to Stand   Sit on edge of chair, feet flat on floor. Stand upright, extending knees fully, not using arms.  Repeat _10___ times per set. Do __1-2__ sets per session. Do 1-2__ sessions per day.  http://orth.exer.us/734   Copyright  VHI. All rights reserved.

## 2014-08-09 ENCOUNTER — Ambulatory Visit: Payer: Medicaid Other | Admitting: Speech Pathology

## 2014-08-09 ENCOUNTER — Ambulatory Visit: Payer: Self-pay | Admitting: Physical Therapy

## 2014-08-14 ENCOUNTER — Ambulatory Visit: Payer: Medicaid Other

## 2014-08-14 DIAGNOSIS — R482 Apraxia: Secondary | ICD-10-CM

## 2014-08-14 DIAGNOSIS — R29898 Other symptoms and signs involving the musculoskeletal system: Secondary | ICD-10-CM

## 2014-08-14 DIAGNOSIS — R269 Unspecified abnormalities of gait and mobility: Secondary | ICD-10-CM

## 2014-08-14 DIAGNOSIS — R4701 Aphasia: Secondary | ICD-10-CM

## 2014-08-14 NOTE — Patient Instructions (Signed)
  Please complete the assigned speech therapy homework prior to your next session.  
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
## 2014-08-16 ENCOUNTER — Other Ambulatory Visit: Payer: Self-pay | Admitting: Registered Nurse

## 2014-08-16 ENCOUNTER — Encounter: Payer: Self-pay | Admitting: Registered Nurse

## 2014-08-16 ENCOUNTER — Encounter: Payer: Medicaid Other | Attending: Physical Medicine & Rehabilitation | Admitting: Registered Nurse

## 2014-08-16 VITALS — BP 150/80 | HR 82 | Resp 14

## 2014-08-16 DIAGNOSIS — S069X9D Unspecified intracranial injury with loss of consciousness of unspecified duration, subsequent encounter: Secondary | ICD-10-CM | POA: Insufficient documentation

## 2014-08-16 DIAGNOSIS — G894 Chronic pain syndrome: Secondary | ICD-10-CM | POA: Diagnosis not present

## 2014-08-16 DIAGNOSIS — S069X0S Unspecified intracranial injury without loss of consciousness, sequela: Secondary | ICD-10-CM

## 2014-08-16 DIAGNOSIS — Z5181 Encounter for therapeutic drug level monitoring: Secondary | ICD-10-CM

## 2014-08-16 DIAGNOSIS — M62838 Other muscle spasm: Secondary | ICD-10-CM

## 2014-08-16 DIAGNOSIS — G8110 Spastic hemiplegia affecting unspecified side: Secondary | ICD-10-CM

## 2014-08-16 DIAGNOSIS — F918 Other conduct disorders: Secondary | ICD-10-CM

## 2014-08-16 DIAGNOSIS — G8111 Spastic hemiplegia affecting right dominant side: Secondary | ICD-10-CM

## 2014-08-16 DIAGNOSIS — Z79899 Other long term (current) drug therapy: Secondary | ICD-10-CM | POA: Diagnosis not present

## 2014-08-16 DIAGNOSIS — R4689 Other symptoms and signs involving appearance and behavior: Secondary | ICD-10-CM

## 2014-08-16 DIAGNOSIS — R51 Headache: Secondary | ICD-10-CM | POA: Diagnosis not present

## 2014-08-16 DIAGNOSIS — M6249 Contracture of muscle, multiple sites: Secondary | ICD-10-CM

## 2014-08-16 MED ORDER — BACLOFEN 10 MG PO TABS: 10.0000 mg | ORAL_TABLET | Freq: Two times a day (BID) | ORAL | Status: DC

## 2014-08-16 MED ORDER — OXYCODONE-ACETAMINOPHEN 7.5-325 MG PO TABS: 1.0000 | ORAL_TABLET | Freq: Four times a day (QID) | ORAL | Status: DC | PRN

## 2014-08-16 MED ORDER — TIZANIDINE HCL 2 MG PO TABS: 2.0000 mg | ORAL_TABLET | Freq: Two times a day (BID) | ORAL | Status: DC
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
## 2014-08-18 LAB — PMP ALCOHOL METABOLITE (ETG): Ethyl Glucuronide (EtG): NEGATIVE ng/mL

## 2014-08-22 LAB — OXYCODONE, URINE (LC/MS-MS)
NOROXYCODONE, UR: 3240 ng/mL (ref <50)
Oxycodone, ur: 672 ng/mL (ref <50)
Oxymorphone: 190 ng/mL (ref <50)

## 2014-08-23 LAB — PRESCRIPTION MONITORING PROFILE (SOLSTAS)
AMPHETAMINE/METH: NEGATIVE ng/mL
BUPRENORPHINE, URINE: NEGATIVE ng/mL
Barbiturate Screen, Urine: NEGATIVE ng/mL
Benzodiazepine Screen, Urine: NEGATIVE ng/mL
CARISOPRODOL, URINE: NEGATIVE ng/mL
COCAINE METABOLITES: NEGATIVE ng/mL
CREATININE, URINE: 217.98 mg/dL (ref >20.0)
Cannabinoid Scrn, Ur: NEGATIVE ng/mL
FENTANYL URINE: NEGATIVE ng/mL
MDMA URINE: NEGATIVE ng/mL
MEPERIDINE UR: NEGATIVE ng/mL
METHADONE SCREEN, URINE: NEGATIVE ng/mL
Nitrites, Initial: NEGATIVE ug/mL
Opiate Screen, Urine: NEGATIVE ng/mL
Propoxyphene: NEGATIVE ng/mL
TAPENTADOLUR: NEGATIVE ng/mL
Tramadol Scrn, Ur: NEGATIVE ng/mL
Zolpidem, Urine: NEGATIVE ng/mL
pH, Initial: 5.8 pH (ref 4.5–8.9)

## 2014-08-28 NOTE — Progress Notes (Signed)
Urine drug screen for this encounter is consistent for prescribed medication 

## 2014-09-12 ENCOUNTER — Encounter: Payer: Medicaid Other | Admitting: Registered Nurse

## 2014-09-13 ENCOUNTER — Telehealth: Payer: Self-pay | Admitting: *Deleted

## 2014-09-13 ENCOUNTER — Encounter: Payer: Medicaid Other | Attending: Physical Medicine & Rehabilitation | Admitting: Registered Nurse

## 2014-09-13 ENCOUNTER — Encounter: Payer: Self-pay | Admitting: Registered Nurse

## 2014-09-13 VITALS — BP 136/80 | HR 72 | Resp 14

## 2014-09-13 DIAGNOSIS — G894 Chronic pain syndrome: Secondary | ICD-10-CM

## 2014-09-13 DIAGNOSIS — S069X9D Unspecified intracranial injury with loss of consciousness of unspecified duration, subsequent encounter: Secondary | ICD-10-CM | POA: Insufficient documentation

## 2014-09-13 DIAGNOSIS — G8110 Spastic hemiplegia affecting unspecified side: Secondary | ICD-10-CM | POA: Diagnosis not present

## 2014-09-13 DIAGNOSIS — S069X0S Unspecified intracranial injury without loss of consciousness, sequela: Secondary | ICD-10-CM | POA: Diagnosis not present

## 2014-09-13 DIAGNOSIS — R51 Headache: Secondary | ICD-10-CM | POA: Insufficient documentation

## 2014-09-13 DIAGNOSIS — G8111 Spastic hemiplegia affecting right dominant side: Secondary | ICD-10-CM

## 2014-09-13 DIAGNOSIS — Z5181 Encounter for therapeutic drug level monitoring: Secondary | ICD-10-CM | POA: Diagnosis not present

## 2014-09-13 DIAGNOSIS — Z79899 Other long term (current) drug therapy: Secondary | ICD-10-CM

## 2014-09-13 MED ORDER — BACLOFEN 10 MG PO TABS: 10.0000 mg | ORAL_TABLET | Freq: Two times a day (BID) | ORAL | Status: DC

## 2014-09-13 MED ORDER — DIVALPROEX SODIUM 250 MG PO DR TAB: 250.0000 mg | DELAYED_RELEASE_TABLET | Freq: Two times a day (BID) | ORAL | Status: DC

## 2014-09-13 MED ORDER — OXYCODONE-ACETAMINOPHEN 7.5-325 MG PO TABS: 1.0000 | ORAL_TABLET | Freq: Three times a day (TID) | ORAL | Status: DC | PRN

## 2014-09-13 NOTE — Telephone Encounter (Signed)
He can have flomax 0.4mg  qhs #30, 3rf
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
## 2014-09-13 NOTE — Telephone Encounter (Signed)
Patient requested a refill on his Flomax - is having problems emptying his bladder and that is what he was prescribed by the [facility].  We have no record so please advise - can get strength and dosing instructions from the pharmacy. Please advise

## 2014-09-14 ENCOUNTER — Other Ambulatory Visit: Payer: Self-pay | Admitting: *Deleted

## 2014-09-14 MED ORDER — TAMSULOSIN HCL 0.4 MG PO CAPS
0.4000 mg | ORAL_CAPSULE | Freq: Every day | ORAL | Status: DC
Start: 2014-09-14 — End: 2015-06-05

## 2014-09-14 NOTE — Telephone Encounter (Signed)
Left Message on Koron's sister's vm (per DPR) that a medication was sent to pharmacy for [facility].

## 2014-09-14 NOTE — Telephone Encounter (Signed)
Ok to refill Flomax - sent in to [facility].  Called patient to advise.

## 2014-10-01 ENCOUNTER — Telehealth: Payer: Self-pay | Admitting: Physical Medicine & Rehabilitation

## 2014-10-01 NOTE — Telephone Encounter (Signed)
Patient needs Bruce [name] to call him back about pills he didn't take--this was discussed at last visit

## 2014-10-01 NOTE — Telephone Encounter (Signed)
I return the call to Mr. Haymer no answer, left message to return the call.

## 2014-10-11 ENCOUNTER — Encounter: Payer: Self-pay | Admitting: Registered Nurse

## 2014-10-11 ENCOUNTER — Encounter: Payer: Medicaid Other | Attending: Physical Medicine & Rehabilitation | Admitting: Registered Nurse

## 2014-10-11 VITALS — BP 133/82 | HR 95

## 2014-10-11 DIAGNOSIS — S069X0S Unspecified intracranial injury without loss of consciousness, sequela: Secondary | ICD-10-CM

## 2014-10-11 DIAGNOSIS — Z79899 Other long term (current) drug therapy: Secondary | ICD-10-CM

## 2014-10-11 DIAGNOSIS — G8111 Spastic hemiplegia affecting right dominant side: Secondary | ICD-10-CM

## 2014-10-11 DIAGNOSIS — R51 Headache: Secondary | ICD-10-CM | POA: Diagnosis not present

## 2014-10-11 DIAGNOSIS — G8110 Spastic hemiplegia affecting unspecified side: Secondary | ICD-10-CM | POA: Diagnosis not present

## 2014-10-11 DIAGNOSIS — G894 Chronic pain syndrome: Secondary | ICD-10-CM | POA: Diagnosis not present

## 2014-10-11 DIAGNOSIS — Z5181 Encounter for therapeutic drug level monitoring: Secondary | ICD-10-CM

## 2014-10-11 DIAGNOSIS — S069X9D Unspecified intracranial injury with loss of consciousness of unspecified duration, subsequent encounter: Secondary | ICD-10-CM | POA: Insufficient documentation

## 2014-10-11 MED ORDER — OXYCODONE-ACETAMINOPHEN 7.5-325 MG PO TABS: 1.0000 | ORAL_TABLET | Freq: Three times a day (TID) | ORAL | Status: DC | PRN
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
## 2014-10-19 ENCOUNTER — Telehealth: Payer: Self-pay | Admitting: *Deleted

## 2014-10-19 MED ORDER — TIZANIDINE HCL 2 MG PO TABS: 2.0000 mg | ORAL_TABLET | Freq: Two times a day (BID) | ORAL | Status: DC

## 2014-10-19 NOTE — Telephone Encounter (Signed)
See Bruce [name] visit note about the baclofen.  Bruce [name] has called about getting something else for his spasticity.  Can you send something different to the pharmacy for him?  I dont think it was addressed and he has called again.

## 2014-10-19 NOTE — Telephone Encounter (Signed)
Sent in rx for tizanidine.  Do not take baclofen with this

## 2014-10-23 NOTE — Telephone Encounter (Signed)
Notified Bruce [name] tizanidine sent to pharmacy.

## 2014-11-15 ENCOUNTER — Encounter: Payer: Self-pay | Admitting: Registered Nurse

## 2014-11-15 ENCOUNTER — Encounter: Payer: Medicaid Other | Attending: Physical Medicine & Rehabilitation | Admitting: Registered Nurse

## 2014-11-15 VITALS — BP 137/87 | HR 65 | Resp 14

## 2014-11-15 DIAGNOSIS — G8110 Spastic hemiplegia affecting unspecified side: Secondary | ICD-10-CM | POA: Diagnosis not present

## 2014-11-15 DIAGNOSIS — R51 Headache: Secondary | ICD-10-CM | POA: Diagnosis not present

## 2014-11-15 DIAGNOSIS — G894 Chronic pain syndrome: Secondary | ICD-10-CM | POA: Diagnosis not present

## 2014-11-15 DIAGNOSIS — S069X0S Unspecified intracranial injury without loss of consciousness, sequela: Secondary | ICD-10-CM | POA: Diagnosis not present

## 2014-11-15 DIAGNOSIS — S069X9D Unspecified intracranial injury with loss of consciousness of unspecified duration, subsequent encounter: Secondary | ICD-10-CM | POA: Insufficient documentation

## 2014-11-15 DIAGNOSIS — Z5181 Encounter for therapeutic drug level monitoring: Secondary | ICD-10-CM | POA: Diagnosis not present

## 2014-11-15 DIAGNOSIS — Z79899 Other long term (current) drug therapy: Secondary | ICD-10-CM

## 2014-11-15 DIAGNOSIS — G8111 Spastic hemiplegia affecting right dominant side: Secondary | ICD-10-CM

## 2014-11-15 MED ORDER — METHOCARBAMOL 500 MG PO TABS: 500.0000 mg | ORAL_TABLET | Freq: Three times a day (TID) | ORAL | Status: DC | PRN

## 2014-11-15 MED ORDER — OXYCODONE-ACETAMINOPHEN 7.5-325 MG PO TABS: 1.0000 | ORAL_TABLET | Freq: Three times a day (TID) | ORAL | Status: DC | PRN
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
## 2014-12-04 ENCOUNTER — Encounter: Payer: Medicaid Other | Admitting: Physical Medicine & Rehabilitation

## 2014-12-11 ENCOUNTER — Encounter: Payer: Medicaid Other | Attending: Physical Medicine & Rehabilitation | Admitting: Registered Nurse

## 2014-12-11 ENCOUNTER — Ambulatory Visit: Payer: Self-pay | Admitting: Registered Nurse

## 2014-12-11 ENCOUNTER — Encounter: Payer: Self-pay | Admitting: Registered Nurse

## 2014-12-11 ENCOUNTER — Other Ambulatory Visit: Payer: Self-pay | Admitting: Registered Nurse

## 2014-12-11 VITALS — BP 133/93 | HR 89 | Resp 16

## 2014-12-11 DIAGNOSIS — S069X9D Unspecified intracranial injury with loss of consciousness of unspecified duration, subsequent encounter: Secondary | ICD-10-CM | POA: Diagnosis present

## 2014-12-11 DIAGNOSIS — G894 Chronic pain syndrome: Secondary | ICD-10-CM | POA: Diagnosis not present

## 2014-12-11 DIAGNOSIS — G8111 Spastic hemiplegia affecting right dominant side: Secondary | ICD-10-CM | POA: Diagnosis not present

## 2014-12-11 DIAGNOSIS — R51 Headache: Secondary | ICD-10-CM | POA: Diagnosis not present

## 2014-12-11 DIAGNOSIS — Z5181 Encounter for therapeutic drug level monitoring: Secondary | ICD-10-CM

## 2014-12-11 DIAGNOSIS — Z79899 Other long term (current) drug therapy: Secondary | ICD-10-CM

## 2014-12-11 DIAGNOSIS — S069X0S Unspecified intracranial injury without loss of consciousness, sequela: Secondary | ICD-10-CM | POA: Diagnosis not present

## 2014-12-11 DIAGNOSIS — M62838 Other muscle spasm: Secondary | ICD-10-CM

## 2014-12-11 DIAGNOSIS — M6249 Contracture of muscle, multiple sites: Secondary | ICD-10-CM

## 2014-12-11 MED ORDER — OXYCODONE-ACETAMINOPHEN 7.5-325 MG PO TABS: 1.0000 | ORAL_TABLET | Freq: Three times a day (TID) | ORAL | Status: DC | PRN
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
## 2014-12-12 ENCOUNTER — Telehealth: Payer: Self-pay | Admitting: Registered Nurse

## 2014-12-12 DIAGNOSIS — M62838 Other muscle spasm: Secondary | ICD-10-CM

## 2014-12-12 DIAGNOSIS — S069X4A Unspecified intracranial injury with loss of consciousness of 6 hours to 24 hours, initial encounter: Secondary | ICD-10-CM

## 2014-12-12 LAB — PMP ALCOHOL METABOLITE (ETG): ETGU: NEGATIVE ng/mL

## 2014-12-16 LAB — OPIATES/OPIOIDS (LC/MS-MS)
CODEINE URINE: NEGATIVE ng/mL (ref <50)
HYDROCODONE: NEGATIVE ng/mL (ref <50)
Hydromorphone: NEGATIVE ng/mL (ref <50)
Morphine Urine: NEGATIVE ng/mL (ref <50)
Norhydrocodone, Ur: NEGATIVE ng/mL (ref <50)
Noroxycodone, Ur: >10000 ng/mL (ref <50)
Oxycodone, ur: 9168 ng/mL (ref <50)
Oxymorphone: 708 ng/mL (ref <50)

## 2014-12-16 LAB — OXYCODONE, URINE (LC/MS-MS)
OXYCODONE, UR: 9168 ng/mL (ref <50)
OXYMORPHONE, URINE: 708 ng/mL (ref <50)

## 2014-12-17 ENCOUNTER — Encounter: Payer: Self-pay | Admitting: Occupational Therapy

## 2014-12-17 NOTE — Therapy (Signed)
[facility] [address] [place], [place], [address] Phone: [phone]   Fax:  [phone]  Patient Details  Name: Bruce [name] MRN: [id number] Date of Birth: 14-Oct-1980 Referring Provider:  No ref. provider found  Encounter Date: 12/17/2014  OCCUPATIONAL THERAPY DISCHARGE SUMMARY  Visits from Start of Care: 1  Current functional level related to goals / functional outcomes: See eval as pt did not return after eval   Remaining deficits: See eval as pt did not return after eval     Education / Equipment: None   Plan: Patient agrees to discharge.  Patient goals were not met. Patient is being discharged due to not returning since the last visit.  Pt only seen for eval.?????                [facility] 12/17/2014, 12:59 PM  [facility] [address] [place], [place], [address]   Patrik Turnbaugh, OTR/L 12/17/2014 1:00 PM    Phone: [phone]   Fax:  [phone]

## 2014-12-18 LAB — PRESCRIPTION MONITORING PROFILE (SOLSTAS)
Amphetamine/Meth: NEGATIVE ng/mL
BUPRENORPHINE, URINE: NEGATIVE ng/mL
Barbiturate Screen, Urine: NEGATIVE ng/mL
Benzodiazepine Screen, Urine: NEGATIVE ng/mL
CANNABINOID SCRN UR: NEGATIVE ng/mL
CREATININE, URINE: 298.69 mg/dL (ref >20.0)
Carisoprodol, Urine: NEGATIVE ng/mL
Cocaine Metabolites: NEGATIVE ng/mL
FENTANYL URINE: NEGATIVE ng/mL
MDMA URINE: NEGATIVE ng/mL
MEPERIDINE UR: NEGATIVE ng/mL
Methadone Screen, Urine: NEGATIVE ng/mL
Nitrites, Initial: NEGATIVE ug/mL
PH URINE, INITIAL: 5.4 pH (ref 4.5–8.9)
Propoxyphene: NEGATIVE ng/mL
Tapentadol, urine: NEGATIVE ng/mL
Tramadol Scrn, Ur: NEGATIVE ng/mL
ZOLPIDEM, URINE: NEGATIVE ng/mL

## 2014-12-27 NOTE — Progress Notes (Signed)
Urine drug screen for this encounter is consistent for prescribed medication 

## 2015-01-14 ENCOUNTER — Encounter: Payer: Medicaid Other | Attending: Physical Medicine & Rehabilitation | Admitting: Physical Medicine & Rehabilitation

## 2015-01-14 ENCOUNTER — Encounter: Payer: Self-pay | Admitting: Physical Medicine & Rehabilitation

## 2015-01-14 ENCOUNTER — Ambulatory Visit: Payer: Self-pay | Admitting: Physical Medicine & Rehabilitation

## 2015-01-14 VITALS — BP 132/81 | HR 78

## 2015-01-14 DIAGNOSIS — F918 Other conduct disorders: Secondary | ICD-10-CM

## 2015-01-14 DIAGNOSIS — S069X9D Unspecified intracranial injury with loss of consciousness of unspecified duration, subsequent encounter: Secondary | ICD-10-CM | POA: Insufficient documentation

## 2015-01-14 DIAGNOSIS — S069X0S Unspecified intracranial injury without loss of consciousness, sequela: Secondary | ICD-10-CM | POA: Diagnosis not present

## 2015-01-14 DIAGNOSIS — G8111 Spastic hemiplegia affecting right dominant side: Secondary | ICD-10-CM

## 2015-01-14 DIAGNOSIS — R4689 Other symptoms and signs involving appearance and behavior: Secondary | ICD-10-CM

## 2015-01-14 DIAGNOSIS — R51 Headache: Secondary | ICD-10-CM | POA: Diagnosis not present

## 2015-01-14 MED ORDER — DIVALPROEX SODIUM 250 MG PO DR TAB
250.0000 mg | DELAYED_RELEASE_TABLET | Freq: Three times a day (TID) | ORAL | Status: DC
Start: 2015-01-14 — End: 2015-01-14

## 2015-01-14 MED ORDER — DIVALPROEX SODIUM 250 MG PO DR TAB: 250.0000 mg | DELAYED_RELEASE_TABLET | Freq: Two times a day (BID) | ORAL | Status: DC

## 2015-01-14 MED ORDER — OXYCODONE-ACETAMINOPHEN 7.5-325 MG PO TABS: 1.0000 | ORAL_TABLET | Freq: Three times a day (TID) | ORAL | Status: DC | PRN

## 2015-01-14 NOTE — Patient Instructions (Signed)
CONTINUE TO WORK ON YOUR MECHANICS WHILE YOU WALK!!!  CONTINUE USING THE DEPAKOTE TO HELP YOUR MOOD
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
## 2015-02-22 ENCOUNTER — Other Ambulatory Visit: Payer: Self-pay | Admitting: Physical Medicine & Rehabilitation

## 2015-02-22 MED ORDER — TIZANIDINE HCL 2 MG PO TABS: 2.0000 mg | ORAL_TABLET | Freq: Two times a day (BID) | ORAL | Status: DC

## 2015-02-22 NOTE — Addendum Note (Signed)
Addended by: Doreene ElandSHUMAKER, SYBIL W on: 02/22/2015 03:52 PM   Modules accepted: Orders

## 2015-02-22 NOTE — Telephone Encounter (Signed)
pt needs refill on percocet and tizanodine - pt will be out next Tuesday and does not have an appt for procedure until the 18th

## 2015-02-26 ENCOUNTER — Telehealth: Payer: Self-pay | Admitting: Physical Medicine & Rehabilitation

## 2015-02-26 DIAGNOSIS — R4689 Other symptoms and signs involving appearance and behavior: Secondary | ICD-10-CM

## 2015-02-26 DIAGNOSIS — G8111 Spastic hemiplegia affecting right dominant side: Secondary | ICD-10-CM

## 2015-02-26 DIAGNOSIS — S069X0S Unspecified intracranial injury without loss of consciousness, sequela: Principal | ICD-10-CM

## 2015-02-26 MED ORDER — OXYCODONE-ACETAMINOPHEN 7.5-325 MG PO TABS: 1.0000 | ORAL_TABLET | Freq: Three times a day (TID) | ORAL | Status: DC | PRN

## 2015-02-26 NOTE — Telephone Encounter (Signed)
needs oxy refill - can pick up today because he does have a ride and i already informed him about the tizanidine...  thanks

## 2015-02-26 NOTE — Telephone Encounter (Signed)
rx printed for [place] to [place].  Last fill 01/14/15
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
## 2015-03-06 ENCOUNTER — Encounter: Payer: Self-pay | Admitting: Physical Medicine & Rehabilitation

## 2015-03-06 ENCOUNTER — Encounter: Payer: Medicaid Other | Attending: Physical Medicine & Rehabilitation | Admitting: Physical Medicine & Rehabilitation

## 2015-03-06 VITALS — BP 154/95 | HR 76 | Resp 14

## 2015-03-06 DIAGNOSIS — F918 Other conduct disorders: Secondary | ICD-10-CM | POA: Diagnosis not present

## 2015-03-06 DIAGNOSIS — G8111 Spastic hemiplegia affecting right dominant side: Secondary | ICD-10-CM

## 2015-03-06 DIAGNOSIS — R51 Headache: Secondary | ICD-10-CM | POA: Insufficient documentation

## 2015-03-06 DIAGNOSIS — S069X9D Unspecified intracranial injury with loss of consciousness of unspecified duration, subsequent encounter: Secondary | ICD-10-CM | POA: Insufficient documentation

## 2015-03-06 DIAGNOSIS — S069X0S Unspecified intracranial injury without loss of consciousness, sequela: Secondary | ICD-10-CM

## 2015-03-06 DIAGNOSIS — R4689 Other symptoms and signs involving appearance and behavior: Secondary | ICD-10-CM

## 2015-03-06 MED ORDER — OXYCODONE-ACETAMINOPHEN 7.5-325 MG PO TABS: 1.0000 | ORAL_TABLET | Freq: Three times a day (TID) | ORAL | Status: DC | PRN

## 2015-03-06 NOTE — Progress Notes (Signed)
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 This includes bleeding, bruising, infection, excessive weakness, or medication side effects. A REMS form is on file and signed.  Needle: 50mm injectable monopolar needle electrode  Number of units per muscle Pectoralis Major 0 units Pectoralis Minor 0 units Biceps and brachialis 100 units, 4 access points Brachioradialis 0 units FCR 10 units FCU 10 units FDS  30 units FDP 30 units FPL 10 units All injections were done after obtaining appropriate EMG activity and after negative drawback for blood. The patient tolerated the procedure well. Post procedure instructions were given. A followup appointment was made.

## 2015-03-06 NOTE — Patient Instructions (Signed)
WE WILL CALL YOU IF WE HEAR ELON ABOUT PHYSICAL THERAPY.   WORK ON STRETCHING DAILY   THE BOTOX TAKES ABOUT 7-10 DAYS TO TAKE EFFECT.

## 2015-04-24 ENCOUNTER — Encounter: Payer: Medicaid Other | Attending: Physical Medicine & Rehabilitation | Admitting: Physical Medicine & Rehabilitation

## 2015-04-24 ENCOUNTER — Encounter: Payer: Self-pay | Admitting: Physical Medicine & Rehabilitation

## 2015-04-24 VITALS — BP 147/88 | HR 77 | Resp 14

## 2015-04-24 DIAGNOSIS — S069X9D Unspecified intracranial injury with loss of consciousness of unspecified duration, subsequent encounter: Secondary | ICD-10-CM | POA: Insufficient documentation

## 2015-04-24 DIAGNOSIS — F918 Other conduct disorders: Secondary | ICD-10-CM

## 2015-04-24 DIAGNOSIS — Z5181 Encounter for therapeutic drug level monitoring: Secondary | ICD-10-CM

## 2015-04-24 DIAGNOSIS — Z79899 Other long term (current) drug therapy: Secondary | ICD-10-CM | POA: Diagnosis not present

## 2015-04-24 DIAGNOSIS — R4689 Other symptoms and signs involving appearance and behavior: Secondary | ICD-10-CM

## 2015-04-24 DIAGNOSIS — S069X0S Unspecified intracranial injury without loss of consciousness, sequela: Secondary | ICD-10-CM

## 2015-04-24 DIAGNOSIS — R51 Headache: Secondary | ICD-10-CM | POA: Diagnosis not present

## 2015-04-24 DIAGNOSIS — S069X0A Unspecified intracranial injury without loss of consciousness, initial encounter: Secondary | ICD-10-CM

## 2015-04-24 DIAGNOSIS — R4701 Aphasia: Secondary | ICD-10-CM

## 2015-04-24 DIAGNOSIS — S069X9A Unspecified intracranial injury with loss of consciousness of unspecified duration, initial encounter: Secondary | ICD-10-CM

## 2015-04-24 DIAGNOSIS — G8111 Spastic hemiplegia affecting right dominant side: Secondary | ICD-10-CM

## 2015-04-24 DIAGNOSIS — S0190XA Unspecified open wound of unspecified part of head, initial encounter: Secondary | ICD-10-CM

## 2015-04-24 DIAGNOSIS — G894 Chronic pain syndrome: Secondary | ICD-10-CM | POA: Diagnosis not present

## 2015-04-24 MED ORDER — OXYCODONE-ACETAMINOPHEN 7.5-325 MG PO TABS
1.0000 | ORAL_TABLET | Freq: Three times a day (TID) | ORAL | Status: DC | PRN
Start: 2015-04-24 — End: 2015-06-05
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
## 2015-04-24 NOTE — Patient Instructions (Signed)
  PLEASE CALL ME WITH ANY PROBLEMS OR QUESTIONS (#[phone]).      

## 2015-04-30 LAB — TOXASSURE SELECT,+ANTIDEPR,UR: PDF: 0

## 2015-04-30 NOTE — Progress Notes (Signed)
Urine drug screen for this encounter is consistent for prescribed medication 

## 2015-05-03 ENCOUNTER — Telehealth: Payer: Self-pay | Admitting: Physical Medicine & Rehabilitation

## 2015-05-03 NOTE — Telephone Encounter (Signed)
Erin with [facility] in [place] needs a call back about adverse reactions with 2 medications patient is taking, Zanaflex and Robaxin.  Her number is [phone].

## 2015-05-03 NOTE — Telephone Encounter (Signed)
I called [facility] back and asked for Erin. They were unavailable.  I them to call back and clarify about the adverse reaction question

## 2015-06-05 ENCOUNTER — Encounter: Payer: Medicaid Other | Attending: Physical Medicine & Rehabilitation | Admitting: Physical Medicine & Rehabilitation

## 2015-06-05 ENCOUNTER — Encounter: Payer: Self-pay | Admitting: Physical Medicine & Rehabilitation

## 2015-06-05 VITALS — BP 143/79 | HR 86

## 2015-06-05 DIAGNOSIS — S069XAA Unspecified intracranial injury with loss of consciousness status unknown, initial encounter: Secondary | ICD-10-CM

## 2015-06-05 DIAGNOSIS — S069X0S Unspecified intracranial injury without loss of consciousness, sequela: Secondary | ICD-10-CM | POA: Diagnosis not present

## 2015-06-05 DIAGNOSIS — R51 Headache: Secondary | ICD-10-CM | POA: Insufficient documentation

## 2015-06-05 DIAGNOSIS — R4701 Aphasia: Secondary | ICD-10-CM

## 2015-06-05 DIAGNOSIS — F918 Other conduct disorders: Secondary | ICD-10-CM | POA: Diagnosis not present

## 2015-06-05 DIAGNOSIS — R4689 Other symptoms and signs involving appearance and behavior: Secondary | ICD-10-CM

## 2015-06-05 DIAGNOSIS — G8111 Spastic hemiplegia affecting right dominant side: Secondary | ICD-10-CM | POA: Diagnosis not present

## 2015-06-05 DIAGNOSIS — S069X9D Unspecified intracranial injury with loss of consciousness of unspecified duration, subsequent encounter: Secondary | ICD-10-CM | POA: Diagnosis not present

## 2015-06-05 DIAGNOSIS — S069X9A Unspecified intracranial injury with loss of consciousness of unspecified duration, initial encounter: Secondary | ICD-10-CM

## 2015-06-05 DIAGNOSIS — S0190XA Unspecified open wound of unspecified part of head, initial encounter: Secondary | ICD-10-CM

## 2015-06-05 DIAGNOSIS — S069X0A Unspecified intracranial injury without loss of consciousness, initial encounter: Secondary | ICD-10-CM

## 2015-06-05 MED ORDER — TOPIRAMATE 25 MG PO TABS
25.0000 mg | ORAL_TABLET | Freq: Every day | ORAL | Status: DC
Start: 2015-06-05 — End: 2015-08-05

## 2015-06-05 MED ORDER — OXYCODONE-ACETAMINOPHEN 7.5-325 MG PO TABS: 1.0000 | ORAL_TABLET | Freq: Three times a day (TID) | ORAL | Status: DC | PRN

## 2015-06-05 NOTE — Patient Instructions (Addendum)
PLEASE CALL ME WITH ANY PROBLEMS OR QUESTIONS (#[phone]).  YOU NEED TO PRACTICE ON YOUR WALKING/STRETCHING AND WITH YOUR MEMORY EVERY DAY!!!        KEEP AN ORGANIZER AND CALENDAR TO HELP WITH MEMORY AND ORGANIZATION.
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
## 2015-07-03 ENCOUNTER — Encounter: Payer: Medicaid Other | Attending: Physical Medicine & Rehabilitation | Admitting: Physical Medicine & Rehabilitation

## 2015-07-03 ENCOUNTER — Encounter: Payer: Self-pay | Admitting: Physical Medicine & Rehabilitation

## 2015-07-03 VITALS — BP 136/87 | HR 65

## 2015-07-03 DIAGNOSIS — F918 Other conduct disorders: Secondary | ICD-10-CM

## 2015-07-03 DIAGNOSIS — R51 Headache: Secondary | ICD-10-CM | POA: Insufficient documentation

## 2015-07-03 DIAGNOSIS — S069X9D Unspecified intracranial injury with loss of consciousness of unspecified duration, subsequent encounter: Secondary | ICD-10-CM | POA: Insufficient documentation

## 2015-07-03 DIAGNOSIS — F068 Other specified mental disorders due to known physiological condition: Secondary | ICD-10-CM | POA: Diagnosis not present

## 2015-07-03 DIAGNOSIS — G8111 Spastic hemiplegia affecting right dominant side: Secondary | ICD-10-CM

## 2015-07-03 DIAGNOSIS — S069X0S Unspecified intracranial injury without loss of consciousness, sequela: Secondary | ICD-10-CM

## 2015-07-03 DIAGNOSIS — R4689 Other symptoms and signs involving appearance and behavior: Secondary | ICD-10-CM

## 2015-07-03 MED ORDER — OXYCODONE-ACETAMINOPHEN 7.5-325 MG PO TABS: 1.0000 | ORAL_TABLET | Freq: Three times a day (TID) | ORAL | Status: DC | PRN
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
## 2015-07-03 NOTE — Patient Instructions (Addendum)
PLEASE CALL ME WITH ANY PROBLEMS OR QUESTIONS (#[phone]).     PLEASE RE-START YOUR TOPAMAX

## 2015-08-02 ENCOUNTER — Encounter: Payer: Self-pay | Admitting: Registered Nurse

## 2015-08-02 ENCOUNTER — Encounter: Payer: Medicaid Other | Attending: Physical Medicine & Rehabilitation | Admitting: Registered Nurse

## 2015-08-02 VITALS — BP 136/88 | HR 72 | Resp 14

## 2015-08-02 DIAGNOSIS — F068 Other specified mental disorders due to known physiological condition: Secondary | ICD-10-CM

## 2015-08-02 DIAGNOSIS — S069X9D Unspecified intracranial injury with loss of consciousness of unspecified duration, subsequent encounter: Secondary | ICD-10-CM | POA: Diagnosis not present

## 2015-08-02 DIAGNOSIS — F918 Other conduct disorders: Secondary | ICD-10-CM

## 2015-08-02 DIAGNOSIS — S069X0S Unspecified intracranial injury without loss of consciousness, sequela: Secondary | ICD-10-CM

## 2015-08-02 DIAGNOSIS — G8111 Spastic hemiplegia affecting right dominant side: Secondary | ICD-10-CM

## 2015-08-02 DIAGNOSIS — R51 Headache: Secondary | ICD-10-CM | POA: Insufficient documentation

## 2015-08-02 DIAGNOSIS — G894 Chronic pain syndrome: Secondary | ICD-10-CM

## 2015-08-02 DIAGNOSIS — Z79899 Other long term (current) drug therapy: Secondary | ICD-10-CM

## 2015-08-02 DIAGNOSIS — Z5181 Encounter for therapeutic drug level monitoring: Secondary | ICD-10-CM

## 2015-08-02 DIAGNOSIS — R4689 Other symptoms and signs involving appearance and behavior: Secondary | ICD-10-CM

## 2015-08-02 MED ORDER — OXYCODONE-ACETAMINOPHEN 7.5-325 MG PO TABS: 1.0000 | ORAL_TABLET | Freq: Three times a day (TID) | ORAL | Status: DC | PRN
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
## 2015-08-29 ENCOUNTER — Encounter: Payer: Medicaid Other | Attending: Physical Medicine & Rehabilitation | Admitting: Registered Nurse

## 2015-08-29 ENCOUNTER — Encounter: Payer: Self-pay | Admitting: Registered Nurse

## 2015-08-29 VITALS — BP 125/84 | HR 75 | Resp 14

## 2015-08-29 DIAGNOSIS — G8111 Spastic hemiplegia affecting right dominant side: Secondary | ICD-10-CM

## 2015-08-29 DIAGNOSIS — S069X9D Unspecified intracranial injury with loss of consciousness of unspecified duration, subsequent encounter: Secondary | ICD-10-CM | POA: Diagnosis present

## 2015-08-29 DIAGNOSIS — R51 Headache: Secondary | ICD-10-CM | POA: Insufficient documentation

## 2015-08-29 DIAGNOSIS — Z5181 Encounter for therapeutic drug level monitoring: Secondary | ICD-10-CM

## 2015-08-29 DIAGNOSIS — F918 Other conduct disorders: Secondary | ICD-10-CM

## 2015-08-29 DIAGNOSIS — F068 Other specified mental disorders due to known physiological condition: Secondary | ICD-10-CM

## 2015-08-29 DIAGNOSIS — S069X4A Unspecified intracranial injury with loss of consciousness of 6 hours to 24 hours, initial encounter: Secondary | ICD-10-CM | POA: Diagnosis not present

## 2015-08-29 DIAGNOSIS — M25561 Pain in right knee: Secondary | ICD-10-CM

## 2015-08-29 DIAGNOSIS — Z79899 Other long term (current) drug therapy: Secondary | ICD-10-CM

## 2015-08-29 DIAGNOSIS — G894 Chronic pain syndrome: Secondary | ICD-10-CM | POA: Diagnosis not present

## 2015-08-29 DIAGNOSIS — R4689 Other symptoms and signs involving appearance and behavior: Secondary | ICD-10-CM

## 2015-08-29 DIAGNOSIS — S069X0S Unspecified intracranial injury without loss of consciousness, sequela: Secondary | ICD-10-CM

## 2015-08-29 MED ORDER — OXYCODONE-ACETAMINOPHEN 7.5-325 MG PO TABS: 1.0000 | ORAL_TABLET | Freq: Three times a day (TID) | ORAL | Status: DC | PRN
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
## 2015-09-06 LAB — TOXASSURE SELECT,+ANTIDEPR,UR: PDF: 0

## 2015-09-10 NOTE — Progress Notes (Signed)
Urine drug screen for this encounter is consistent for prescribed medication 

## 2015-09-25 NOTE — Telephone Encounter (Signed)
error 

## 2015-09-30 ENCOUNTER — Encounter: Payer: Medicaid Other | Attending: Physical Medicine & Rehabilitation | Admitting: Registered Nurse

## 2015-09-30 ENCOUNTER — Encounter: Payer: Self-pay | Admitting: Registered Nurse

## 2015-09-30 VITALS — BP 128/87 | HR 63 | Resp 14

## 2015-09-30 DIAGNOSIS — S069X9D Unspecified intracranial injury with loss of consciousness of unspecified duration, subsequent encounter: Secondary | ICD-10-CM | POA: Insufficient documentation

## 2015-09-30 DIAGNOSIS — R4689 Other symptoms and signs involving appearance and behavior: Secondary | ICD-10-CM

## 2015-09-30 DIAGNOSIS — F918 Other conduct disorders: Secondary | ICD-10-CM

## 2015-09-30 DIAGNOSIS — F068 Other specified mental disorders due to known physiological condition: Secondary | ICD-10-CM | POA: Diagnosis not present

## 2015-09-30 DIAGNOSIS — G894 Chronic pain syndrome: Secondary | ICD-10-CM | POA: Diagnosis not present

## 2015-09-30 DIAGNOSIS — S069XAS Unspecified intracranial injury with loss of consciousness status unknown, sequela: Secondary | ICD-10-CM

## 2015-09-30 DIAGNOSIS — G8111 Spastic hemiplegia affecting right dominant side: Secondary | ICD-10-CM

## 2015-09-30 DIAGNOSIS — Z79899 Other long term (current) drug therapy: Secondary | ICD-10-CM

## 2015-09-30 DIAGNOSIS — R51 Headache: Secondary | ICD-10-CM | POA: Diagnosis not present

## 2015-09-30 DIAGNOSIS — S069X4A Unspecified intracranial injury with loss of consciousness of 6 hours to 24 hours, initial encounter: Secondary | ICD-10-CM | POA: Diagnosis not present

## 2015-09-30 DIAGNOSIS — R4189 Other symptoms and signs involving cognitive functions and awareness: Secondary | ICD-10-CM

## 2015-09-30 DIAGNOSIS — S069X0S Unspecified intracranial injury without loss of consciousness, sequela: Secondary | ICD-10-CM

## 2015-09-30 DIAGNOSIS — Z5181 Encounter for therapeutic drug level monitoring: Secondary | ICD-10-CM

## 2015-09-30 MED ORDER — OXYCODONE-ACETAMINOPHEN 7.5-325 MG PO TABS: 1.0000 | ORAL_TABLET | Freq: Three times a day (TID) | ORAL | 0 refills | Status: DC | PRN
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
## 2015-10-03 ENCOUNTER — Other Ambulatory Visit: Payer: Self-pay | Admitting: *Deleted

## 2015-10-03 MED ORDER — METHOCARBAMOL 500 MG PO TABS
500.0000 mg | ORAL_TABLET | Freq: Three times a day (TID) | ORAL | 2 refills | Status: DC | PRN
Start: 2015-10-03 — End: 2015-12-02

## 2015-10-03 MED ORDER — TIZANIDINE HCL 2 MG PO TABS: 2.0000 mg | ORAL_TABLET | Freq: Two times a day (BID) | ORAL | 3 refills | Status: DC

## 2015-10-28 ENCOUNTER — Ambulatory Visit: Payer: Self-pay | Admitting: Registered Nurse

## 2015-10-31 ENCOUNTER — Encounter: Payer: Self-pay | Admitting: Registered Nurse

## 2015-10-31 ENCOUNTER — Telehealth: Payer: Self-pay | Admitting: *Deleted

## 2015-10-31 ENCOUNTER — Encounter: Payer: Medicaid Other | Attending: Physical Medicine & Rehabilitation | Admitting: Registered Nurse

## 2015-10-31 VITALS — BP 144/98 | HR 78 | Resp 14

## 2015-10-31 DIAGNOSIS — S069X9D Unspecified intracranial injury with loss of consciousness of unspecified duration, subsequent encounter: Secondary | ICD-10-CM | POA: Insufficient documentation

## 2015-10-31 DIAGNOSIS — G8111 Spastic hemiplegia affecting right dominant side: Secondary | ICD-10-CM

## 2015-10-31 DIAGNOSIS — M62838 Other muscle spasm: Secondary | ICD-10-CM

## 2015-10-31 DIAGNOSIS — R51 Headache: Secondary | ICD-10-CM | POA: Diagnosis not present

## 2015-10-31 DIAGNOSIS — S069X4A Unspecified intracranial injury with loss of consciousness of 6 hours to 24 hours, initial encounter: Secondary | ICD-10-CM | POA: Diagnosis not present

## 2015-10-31 DIAGNOSIS — G894 Chronic pain syndrome: Secondary | ICD-10-CM

## 2015-10-31 DIAGNOSIS — Z5181 Encounter for therapeutic drug level monitoring: Secondary | ICD-10-CM

## 2015-10-31 DIAGNOSIS — S069X0S Unspecified intracranial injury without loss of consciousness, sequela: Secondary | ICD-10-CM

## 2015-10-31 DIAGNOSIS — Z79899 Other long term (current) drug therapy: Secondary | ICD-10-CM

## 2015-10-31 DIAGNOSIS — F068 Other specified mental disorders due to known physiological condition: Secondary | ICD-10-CM | POA: Diagnosis not present

## 2015-10-31 DIAGNOSIS — M6249 Contracture of muscle, multiple sites: Secondary | ICD-10-CM

## 2015-10-31 MED ORDER — OXYCODONE-ACETAMINOPHEN 7.5-325 MG PO TABS: 1.0000 | ORAL_TABLET | Freq: Three times a day (TID) | ORAL | 0 refills | Status: DC | PRN

## 2015-10-31 NOTE — Telephone Encounter (Signed)
[facility]aminophen 7.5/325 #90

## 2015-10-31 NOTE — Patient Instructions (Signed)
Try to see if they are seeing new patients: Primary Physician   [facility]: [phone]   Or  Urgent  Care/ Family Medicine : [phone]
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
## 2015-11-14 ENCOUNTER — Ambulatory Visit (HOSPITAL_COMMUNITY)
Admission: RE | Admit: 2015-11-14 | Discharge: 2015-11-14 | Disposition: A | Discharge status: Home / Self Care | Payer: Medicaid Other | Source: Ambulatory Visit | Attending: Registered Nurse | Admitting: Registered Nurse

## 2015-11-14 DIAGNOSIS — M25461 Effusion, right knee: Secondary | ICD-10-CM | POA: Diagnosis not present

## 2015-11-14 DIAGNOSIS — M1711 Unilateral primary osteoarthritis, right knee: Secondary | ICD-10-CM | POA: Insufficient documentation

## 2015-11-14 DIAGNOSIS — M25561 Pain in right knee: Secondary | ICD-10-CM | POA: Diagnosis present

## 2015-11-28 ENCOUNTER — Ambulatory Visit: Payer: Self-pay | Admitting: Registered Nurse

## 2015-12-02 ENCOUNTER — Encounter: Payer: Medicaid Other | Attending: Physical Medicine & Rehabilitation | Admitting: Registered Nurse

## 2015-12-02 ENCOUNTER — Encounter: Payer: Self-pay | Admitting: Registered Nurse

## 2015-12-02 VITALS — BP 127/86 | HR 68 | Resp 14

## 2015-12-02 DIAGNOSIS — F068 Other specified mental disorders due to known physiological condition: Secondary | ICD-10-CM

## 2015-12-02 DIAGNOSIS — M62838 Other muscle spasm: Secondary | ICD-10-CM

## 2015-12-02 DIAGNOSIS — Z5181 Encounter for therapeutic drug level monitoring: Secondary | ICD-10-CM | POA: Diagnosis not present

## 2015-12-02 DIAGNOSIS — R51 Headache: Secondary | ICD-10-CM | POA: Diagnosis not present

## 2015-12-02 DIAGNOSIS — S069X0S Unspecified intracranial injury without loss of consciousness, sequela: Secondary | ICD-10-CM

## 2015-12-02 DIAGNOSIS — Z79899 Other long term (current) drug therapy: Secondary | ICD-10-CM

## 2015-12-02 DIAGNOSIS — G894 Chronic pain syndrome: Secondary | ICD-10-CM | POA: Diagnosis not present

## 2015-12-02 DIAGNOSIS — G8111 Spastic hemiplegia affecting right dominant side: Secondary | ICD-10-CM

## 2015-12-02 DIAGNOSIS — S069X9D Unspecified intracranial injury with loss of consciousness of unspecified duration, subsequent encounter: Secondary | ICD-10-CM | POA: Diagnosis not present

## 2015-12-02 MED ORDER — OXYCODONE-ACETAMINOPHEN 7.5-325 MG PO TABS: 1.0000 | ORAL_TABLET | Freq: Three times a day (TID) | ORAL | 0 refills | Status: DC | PRN
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
## 2015-12-02 NOTE — Patient Instructions (Signed)
For Primary Care    [facility]: [phone]   Or  Urgent  Care/ Family Medicine : [phone]

## 2015-12-30 ENCOUNTER — Encounter: Payer: Medicaid Other | Attending: Physical Medicine & Rehabilitation | Admitting: Registered Nurse

## 2015-12-30 ENCOUNTER — Encounter: Payer: Self-pay | Admitting: Registered Nurse

## 2015-12-30 VITALS — BP 128/87 | HR 69

## 2015-12-30 DIAGNOSIS — F068 Other specified mental disorders due to known physiological condition: Secondary | ICD-10-CM | POA: Diagnosis not present

## 2015-12-30 DIAGNOSIS — G8111 Spastic hemiplegia affecting right dominant side: Secondary | ICD-10-CM

## 2015-12-30 DIAGNOSIS — Z5181 Encounter for therapeutic drug level monitoring: Secondary | ICD-10-CM | POA: Diagnosis not present

## 2015-12-30 DIAGNOSIS — G894 Chronic pain syndrome: Secondary | ICD-10-CM | POA: Diagnosis not present

## 2015-12-30 DIAGNOSIS — Z79899 Other long term (current) drug therapy: Secondary | ICD-10-CM

## 2015-12-30 DIAGNOSIS — R51 Headache: Secondary | ICD-10-CM | POA: Insufficient documentation

## 2015-12-30 DIAGNOSIS — S069X9D Unspecified intracranial injury with loss of consciousness of unspecified duration, subsequent encounter: Secondary | ICD-10-CM | POA: Insufficient documentation

## 2015-12-30 DIAGNOSIS — S069X0S Unspecified intracranial injury without loss of consciousness, sequela: Secondary | ICD-10-CM

## 2015-12-30 MED ORDER — OXYCODONE-ACETAMINOPHEN 7.5-325 MG PO TABS: 1.0000 | ORAL_TABLET | Freq: Three times a day (TID) | ORAL | 0 refills | Status: DC | PRN
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
## 2016-01-14 ENCOUNTER — Encounter: Payer: Self-pay | Admitting: Occupational Therapy

## 2016-01-14 ENCOUNTER — Ambulatory Visit: Payer: Medicaid Other | Attending: Registered Nurse | Admitting: Occupational Therapy

## 2016-01-14 DIAGNOSIS — R208 Other disturbances of skin sensation: Secondary | ICD-10-CM | POA: Diagnosis present

## 2016-01-14 DIAGNOSIS — M6281 Muscle weakness (generalized): Secondary | ICD-10-CM | POA: Insufficient documentation

## 2016-01-14 DIAGNOSIS — G8113 Spastic hemiplegia affecting right nondominant side: Secondary | ICD-10-CM | POA: Insufficient documentation
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
## 2016-01-28 ENCOUNTER — Ambulatory Visit: Payer: Medicaid Other | Attending: Registered Nurse | Admitting: Occupational Therapy

## 2016-01-28 ENCOUNTER — Encounter: Payer: Self-pay | Admitting: Occupational Therapy

## 2016-01-28 DIAGNOSIS — M6281 Muscle weakness (generalized): Secondary | ICD-10-CM | POA: Diagnosis present

## 2016-01-28 DIAGNOSIS — R208 Other disturbances of skin sensation: Secondary | ICD-10-CM | POA: Diagnosis present

## 2016-01-28 DIAGNOSIS — G8113 Spastic hemiplegia affecting right nondominant side: Secondary | ICD-10-CM | POA: Diagnosis present

## 2016-01-28 NOTE — Patient Instructions (Signed)
Arm exercises:  All of your arm exercises are on your phone.  There are 4 all together.  You will do all of them laying down on your back. Do not use any weights.  Move slowly - fast movement will make your arm tight! Do these twice a day!  1. Raise the cane toward the ceiling.  Do 15, rest then do 15 more.  2. Raise the cane over your head.  Do 15, rest then do 15 more.   3. Raise the cane side to side.  Do 15, rest then do 15 more.  4. Lay with your hands behind your head and let your elbows relax toward the bed.  Just relax and stay in this position for 8-10 minutes.
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
## 2016-01-30 ENCOUNTER — Encounter: Payer: Medicaid Other | Attending: Physical Medicine & Rehabilitation | Admitting: Registered Nurse

## 2016-01-30 ENCOUNTER — Encounter: Payer: Self-pay | Admitting: Registered Nurse

## 2016-01-30 VITALS — BP 138/88 | HR 79

## 2016-01-30 DIAGNOSIS — F068 Other specified mental disorders due to known physiological condition: Secondary | ICD-10-CM

## 2016-01-30 DIAGNOSIS — R4189 Other symptoms and signs involving cognitive functions and awareness: Secondary | ICD-10-CM

## 2016-01-30 DIAGNOSIS — Z79899 Other long term (current) drug therapy: Secondary | ICD-10-CM | POA: Diagnosis not present

## 2016-01-30 DIAGNOSIS — R51 Headache: Secondary | ICD-10-CM | POA: Insufficient documentation

## 2016-01-30 DIAGNOSIS — G894 Chronic pain syndrome: Secondary | ICD-10-CM

## 2016-01-30 DIAGNOSIS — S069X0S Unspecified intracranial injury without loss of consciousness, sequela: Secondary | ICD-10-CM

## 2016-01-30 DIAGNOSIS — Z5181 Encounter for therapeutic drug level monitoring: Secondary | ICD-10-CM

## 2016-01-30 DIAGNOSIS — S069X9D Unspecified intracranial injury with loss of consciousness of unspecified duration, subsequent encounter: Secondary | ICD-10-CM | POA: Insufficient documentation

## 2016-01-30 DIAGNOSIS — M62838 Other muscle spasm: Secondary | ICD-10-CM

## 2016-01-30 DIAGNOSIS — G8111 Spastic hemiplegia affecting right dominant side: Secondary | ICD-10-CM

## 2016-01-30 MED ORDER — TIZANIDINE HCL 2 MG PO TABS: 2.0000 mg | ORAL_TABLET | Freq: Two times a day (BID) | ORAL | 3 refills | Status: DC

## 2016-01-30 MED ORDER — OXYCODONE-ACETAMINOPHEN 7.5-325 MG PO TABS: 1.0000 | ORAL_TABLET | Freq: Three times a day (TID) | ORAL | 0 refills | Status: DC | PRN

## 2016-01-30 NOTE — Addendum Note (Signed)
Addended by: Angela NevinWESSLING, Maurianna Benard D on: 01/30/2016 03:29 PM   Modules accepted: Orders
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
## 2016-02-04 ENCOUNTER — Encounter: Payer: Self-pay | Admitting: Occupational Therapy

## 2016-02-04 ENCOUNTER — Ambulatory Visit: Payer: Medicaid Other | Admitting: Occupational Therapy

## 2016-02-04 DIAGNOSIS — G8113 Spastic hemiplegia affecting right nondominant side: Secondary | ICD-10-CM | POA: Diagnosis not present

## 2016-02-04 DIAGNOSIS — R208 Other disturbances of skin sensation: Secondary | ICD-10-CM

## 2016-02-04 DIAGNOSIS — M6281 Muscle weakness (generalized): Secondary | ICD-10-CM
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
## 2016-02-06 LAB — TOXASSURE SELECT,+ANTIDEPR,UR

## 2016-02-11 ENCOUNTER — Ambulatory Visit: Payer: Medicaid Other | Admitting: Occupational Therapy

## 2016-02-12 ENCOUNTER — Ambulatory Visit: Payer: Medicaid Other | Admitting: Occupational Therapy

## 2016-02-12 ENCOUNTER — Encounter: Payer: Self-pay | Admitting: Occupational Therapy

## 2016-02-12 DIAGNOSIS — G8113 Spastic hemiplegia affecting right nondominant side: Secondary | ICD-10-CM

## 2016-02-12 DIAGNOSIS — R208 Other disturbances of skin sensation: Secondary | ICD-10-CM

## 2016-02-12 DIAGNOSIS — M6281 Muscle weakness (generalized): Secondary | ICD-10-CM
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
## 2016-02-13 ENCOUNTER — Encounter: Payer: Self-pay | Admitting: Occupational Therapy

## 2016-02-13 NOTE — Progress Notes (Signed)
Urine drug screen for this encounter is consistent for prescribed medication 
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
## 2016-02-19 ENCOUNTER — Encounter: Payer: Self-pay | Admitting: Occupational Therapy

## 2016-02-27 ENCOUNTER — Encounter: Payer: Self-pay | Admitting: Registered Nurse

## 2016-02-27 ENCOUNTER — Encounter: Payer: Medicaid Other | Attending: Physical Medicine & Rehabilitation | Admitting: Registered Nurse

## 2016-02-27 VITALS — BP 145/92 | HR 82

## 2016-02-27 DIAGNOSIS — G8111 Spastic hemiplegia affecting right dominant side: Secondary | ICD-10-CM

## 2016-02-27 DIAGNOSIS — Z5181 Encounter for therapeutic drug level monitoring: Secondary | ICD-10-CM | POA: Diagnosis not present

## 2016-02-27 DIAGNOSIS — G894 Chronic pain syndrome: Secondary | ICD-10-CM

## 2016-02-27 DIAGNOSIS — M62838 Other muscle spasm: Secondary | ICD-10-CM

## 2016-02-27 DIAGNOSIS — S069X9D Unspecified intracranial injury with loss of consciousness of unspecified duration, subsequent encounter: Secondary | ICD-10-CM | POA: Diagnosis not present

## 2016-02-27 DIAGNOSIS — F068 Other specified mental disorders due to known physiological condition: Secondary | ICD-10-CM

## 2016-02-27 DIAGNOSIS — M542 Cervicalgia: Secondary | ICD-10-CM | POA: Diagnosis not present

## 2016-02-27 DIAGNOSIS — S069X0S Unspecified intracranial injury without loss of consciousness, sequela: Secondary | ICD-10-CM

## 2016-02-27 DIAGNOSIS — R51 Headache: Secondary | ICD-10-CM | POA: Diagnosis not present

## 2016-02-27 DIAGNOSIS — Z79899 Other long term (current) drug therapy: Secondary | ICD-10-CM

## 2016-02-27 MED ORDER — OXYCODONE-ACETAMINOPHEN 7.5-325 MG PO TABS: 1.0000 | ORAL_TABLET | Freq: Three times a day (TID) | ORAL | 0 refills | Status: DC | PRN
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
## 2016-03-30 ENCOUNTER — Encounter: Payer: Medicaid Other | Attending: Physical Medicine & Rehabilitation | Admitting: Physical Medicine & Rehabilitation

## 2016-03-30 ENCOUNTER — Encounter: Payer: Self-pay | Admitting: Physical Medicine & Rehabilitation

## 2016-03-30 VITALS — BP 148/88 | HR 62 | Resp 14

## 2016-03-30 DIAGNOSIS — G8111 Spastic hemiplegia affecting right dominant side: Secondary | ICD-10-CM | POA: Diagnosis not present

## 2016-03-30 DIAGNOSIS — R51 Headache: Secondary | ICD-10-CM | POA: Diagnosis not present

## 2016-03-30 DIAGNOSIS — S069X9D Unspecified intracranial injury with loss of consciousness of unspecified duration, subsequent encounter: Secondary | ICD-10-CM | POA: Insufficient documentation

## 2016-03-30 MED ORDER — OXYCODONE-ACETAMINOPHEN 7.5-325 MG PO TABS: 1.0000 | ORAL_TABLET | Freq: Three times a day (TID) | ORAL | 0 refills | Status: DC | PRN

## 2016-03-30 NOTE — Progress Notes (Signed)
Botox Injection for spasticity using needle EMG guidance Indication: Right spastic hemiparesis (HCC) - Plan: oxyCODONE-acetaminophen (PERCOCET) 7.5-325 MG tablet   Dilution: 100 Units/ml        Total Units Injected: 200 Indication: Severe spasticity which interferes with ADL,mobility and/or  hygiene and is unresponsive to medication management and other conservative care Informed consent was obtained after describing risks and benefits of the procedure with the patient. This includes bleeding, bruising, infection, excessive weakness, or medication side effects. A REMS form is on file and signed.  Needle: 50mm injectable monopolar needle electrode   RUE Number of units per muscle Pectoralis Major 25 units Pectoralis Minor 50 units Teres Major/Minor 25 units Biceps 100 units Brachioradialis 0 units FCR 0 units FCU 0 units FDS 0 units FDP 0 units FPL 0 units Pronator Teres 0 units Pronator Quadratus 0 units  All injections were done after obtaining appropriate EMG activity and after negative drawback for blood. The patient tolerated the procedure well. Post procedure instructions were given. Return in about 1 month (around 04/27/2016) [facility]  Refilled percocet today Reviewed HEP

## 2016-03-30 NOTE — Patient Instructions (Signed)
PLEASE FEEL FREE TO CALL OUR OFFICE WITH ANY PROBLEMS OR QUESTIONS ([phone])      

## 2016-04-27 ENCOUNTER — Encounter: Payer: Medicaid Other | Attending: Physical Medicine & Rehabilitation | Admitting: *Deleted

## 2016-04-27 ENCOUNTER — Encounter (HOSPITAL_BASED_OUTPATIENT_CLINIC_OR_DEPARTMENT_OTHER): Payer: Medicaid Other | Admitting: *Deleted

## 2016-04-27 ENCOUNTER — Encounter: Payer: Medicaid Other | Admitting: Registered Nurse

## 2016-04-27 VITALS — BP 133/84 | HR 93

## 2016-04-27 DIAGNOSIS — Z5181 Encounter for therapeutic drug level monitoring: Secondary | ICD-10-CM

## 2016-04-27 DIAGNOSIS — S069X9D Unspecified intracranial injury with loss of consciousness of unspecified duration, subsequent encounter: Secondary | ICD-10-CM | POA: Insufficient documentation

## 2016-04-27 DIAGNOSIS — G894 Chronic pain syndrome: Secondary | ICD-10-CM

## 2016-04-27 DIAGNOSIS — R51 Headache: Secondary | ICD-10-CM | POA: Insufficient documentation

## 2016-04-27 DIAGNOSIS — Z79899 Other long term (current) drug therapy: Secondary | ICD-10-CM

## 2016-04-27 MED ORDER — TIZANIDINE HCL 2 MG PO TABS: 2.0000 mg | ORAL_TABLET | Freq: Two times a day (BID) | ORAL | 3 refills | Status: DC

## 2016-04-27 MED ORDER — OXYCODONE-ACETAMINOPHEN 7.5-325 MG PO TABS: 1.0000 | ORAL_TABLET | Freq: Three times a day (TID) | ORAL | 0 refills | Status: DC | PRN

## 2016-04-27 NOTE — Progress Notes (Signed)
[name] is her for medication refill.  A random UDS was collected today.  He did not have his pill bottle with him.  I checked NCCSR and last refill was 04/27/16 #90 and he says he has 3 pills left.  A new Rx was given for the oxycodone 7.5/325 #90 and his tizanidine refill was sent to the pharmacy directly.  His pain score today was a 9. No falls and no other changes since last visit. He will return next month to see Riley LamEunice NP.

## 2016-04-28 ENCOUNTER — Telehealth: Payer: Self-pay | Admitting: *Deleted

## 2016-04-28 NOTE — Telephone Encounter (Signed)
Prior auth initiated with [facility] for oxycodone acetaminophen 7.5/325.

## 2016-04-29 NOTE — Telephone Encounter (Signed)
[place] [facility] Oxycodone acetaminophen Prior Approval #:[id number]#:[id number]   Confirmation #:[id number]#:[id number] F approved  04/28/16 - 10/25/16

## 2016-04-30 LAB — TOXASSURE SELECT,+ANTIDEPR,UR

## 2016-05-04 ENCOUNTER — Telehealth: Payer: Self-pay | Admitting: *Deleted

## 2016-05-04 NOTE — Telephone Encounter (Addendum)
Urine drug screen for this encounter is inconsistent for prescribed medication. It shows that there is no medication and no metabolites.  Bruce [name] did not have his bottle at last visit but told me he had 3 pills left.  By this report he should not have been showing no medication or metabolites.  I have left a message for Bruce [name] to call the office. Per NCCSR his last fill was 03/30/16 for the encounter with the UDS which would confirm he was on day 29 of his narcotic medication.

## 2016-05-06 NOTE — Telephone Encounter (Signed)
Have not been able to reach FarnerJason.  This will have to be discussed at next appt.

## 2016-05-20 ENCOUNTER — Encounter: Payer: Medicaid Other | Attending: Physical Medicine & Rehabilitation | Admitting: Registered Nurse

## 2016-05-20 ENCOUNTER — Telehealth: Payer: Self-pay | Admitting: Registered Nurse

## 2016-05-20 ENCOUNTER — Encounter: Payer: Self-pay | Admitting: Registered Nurse

## 2016-05-20 VITALS — BP 125/81 | HR 88

## 2016-05-20 DIAGNOSIS — G894 Chronic pain syndrome: Secondary | ICD-10-CM | POA: Diagnosis not present

## 2016-05-20 DIAGNOSIS — S069X0S Unspecified intracranial injury without loss of consciousness, sequela: Secondary | ICD-10-CM

## 2016-05-20 DIAGNOSIS — F068 Other specified mental disorders due to known physiological condition: Secondary | ICD-10-CM

## 2016-05-20 DIAGNOSIS — Z5181 Encounter for therapeutic drug level monitoring: Secondary | ICD-10-CM | POA: Diagnosis not present

## 2016-05-20 DIAGNOSIS — Z79899 Other long term (current) drug therapy: Secondary | ICD-10-CM | POA: Diagnosis not present

## 2016-05-20 DIAGNOSIS — S069X9D Unspecified intracranial injury with loss of consciousness of unspecified duration, subsequent encounter: Secondary | ICD-10-CM | POA: Insufficient documentation

## 2016-05-20 DIAGNOSIS — G44329 Chronic post-traumatic headache, not intractable: Secondary | ICD-10-CM | POA: Diagnosis not present

## 2016-05-20 DIAGNOSIS — R51 Headache: Secondary | ICD-10-CM | POA: Diagnosis not present

## 2016-05-20 DIAGNOSIS — G8111 Spastic hemiplegia affecting right dominant side: Secondary | ICD-10-CM | POA: Diagnosis not present

## 2016-05-20 MED ORDER — OXYCODONE-ACETAMINOPHEN 7.5-325 MG PO TABS: 1.0000 | ORAL_TABLET | Freq: Three times a day (TID) | ORAL | 0 refills | Status: DC | PRN
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
## 2016-05-20 NOTE — Telephone Encounter (Signed)
On 05/20/2016 the  NCCSR was reviewed no conflict was seen on the [facility] Controlled Substance Reporting System with multiple prescribers. Mr. Hammitt has a signed narcotic contract with our office. If there were any discrepancies this would have been reported to his physician.

## 2016-05-25 LAB — TOXASSURE SELECT,+ANTIDEPR,UR

## 2016-05-28 ENCOUNTER — Ambulatory Visit: Payer: Self-pay | Admitting: Registered Nurse

## 2016-05-28 ENCOUNTER — Telehealth: Payer: Self-pay | Admitting: Registered Nurse

## 2016-05-28 NOTE — Telephone Encounter (Signed)
Mr. Haug UDS was performed on 05/20/2016  it was consistent.

## 2016-06-23 ENCOUNTER — Encounter: Payer: Medicaid Other | Attending: Physical Medicine & Rehabilitation | Admitting: Physical Medicine & Rehabilitation

## 2016-06-23 ENCOUNTER — Encounter: Payer: Self-pay | Admitting: Physical Medicine & Rehabilitation

## 2016-06-23 DIAGNOSIS — G8111 Spastic hemiplegia affecting right dominant side: Secondary | ICD-10-CM

## 2016-06-23 DIAGNOSIS — S069X9D Unspecified intracranial injury with loss of consciousness of unspecified duration, subsequent encounter: Secondary | ICD-10-CM | POA: Diagnosis present

## 2016-06-23 DIAGNOSIS — R51 Headache: Secondary | ICD-10-CM | POA: Diagnosis not present

## 2016-06-23 MED ORDER — DICLOFENAC SODIUM 75 MG PO TBEC: 75.0000 mg | DELAYED_RELEASE_TABLET | Freq: Two times a day (BID) | ORAL | 3 refills | Status: DC

## 2016-06-23 MED ORDER — OXYCODONE-ACETAMINOPHEN 7.5-325 MG PO TABS: 1.0000 | ORAL_TABLET | Freq: Three times a day (TID) | ORAL | 0 refills | Status: DC | PRN

## 2016-06-23 NOTE — Patient Instructions (Signed)
PLEASE FEEL FREE TO CALL OUR OFFICE WITH ANY PROBLEMS OR QUESTIONS ([phone])      

## 2016-06-23 NOTE — Progress Notes (Signed)
Botox Injection for spasticity using needle EMG guidance Indication: Right spastic hemiparesis (HCC) - Plan: oxyCODONE-acetaminophen (PERCOCET) 7.5-325 MG tablet   Dilution: 100 Units/ml        Total Units Injected: 200 Indication: Severe spasticity which interferes with ADL,mobility and/or  hygiene and is unresponsive to medication management and other conservative care Informed consent was obtained after describing risks and benefits of the procedure with the patient. This includes bleeding, bruising, infection, excessive weakness, or medication side effects. A REMS form is on file and signed.  Needle: 50mm injectable monopolar needle electrode  RUE: Number of units per muscle Pectoralis Major 0 units Pectoralis Minor 50 units Biceps 175 units Brachioradialis 25 units FCR 0 units FCU 0 units FDS 0 units FDP 0 units FPL 0 units Pronator Teres 0 units Pronator Quadratus 0 units  All injections were done after obtaining appropriate EMG activity and after negative drawback for blood. The patient tolerated the procedure well. Post procedure instructions were given. Return in about 1 month (around 07/24/2016) for NP VISIT.  I did prescribe voltaren EC 75mg  BID for back pain. #60
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
## 2016-07-10 ENCOUNTER — Telehealth: Payer: Self-pay | Admitting: *Deleted

## 2016-07-10 MED ORDER — MELOXICAM 15 MG PO TABS
15.0000 mg | ORAL_TABLET | Freq: Every day | ORAL | 3 refills | Status: DC
Start: 2016-07-10 — End: 2016-10-06

## 2016-07-10 NOTE — Telephone Encounter (Signed)
Incoming fax requesting prior authorization for diclofenac sodium 75 mg tablets.  Medication is non-preferred. Preferred options include ibuprofen, naproxen, meloxicam,silindac, ketorolac, and indomethacin. It is unlikely that diclofenac will be approved (I will submit anyways) without Dx of osteoarthritis of hands, wrists, knees, etc...  FYI

## 2016-07-10 NOTE — Telephone Encounter (Signed)
meloxicam e-rx'ed

## 2016-07-24 ENCOUNTER — Encounter: Payer: Medicaid Other | Attending: Physical Medicine & Rehabilitation | Admitting: Registered Nurse

## 2016-07-24 ENCOUNTER — Encounter: Payer: Self-pay | Admitting: Registered Nurse

## 2016-07-24 VITALS — BP 133/93 | HR 76

## 2016-07-24 DIAGNOSIS — Z79899 Other long term (current) drug therapy: Secondary | ICD-10-CM | POA: Diagnosis not present

## 2016-07-24 DIAGNOSIS — G894 Chronic pain syndrome: Secondary | ICD-10-CM

## 2016-07-24 DIAGNOSIS — S069X9D Unspecified intracranial injury with loss of consciousness of unspecified duration, subsequent encounter: Secondary | ICD-10-CM | POA: Insufficient documentation

## 2016-07-24 DIAGNOSIS — G44329 Chronic post-traumatic headache, not intractable: Secondary | ICD-10-CM

## 2016-07-24 DIAGNOSIS — R51 Headache: Secondary | ICD-10-CM | POA: Diagnosis not present

## 2016-07-24 DIAGNOSIS — G8111 Spastic hemiplegia affecting right dominant side: Secondary | ICD-10-CM | POA: Diagnosis not present

## 2016-07-24 DIAGNOSIS — S069X0S Unspecified intracranial injury without loss of consciousness, sequela: Secondary | ICD-10-CM | POA: Diagnosis not present

## 2016-07-24 DIAGNOSIS — F068 Other specified mental disorders due to known physiological condition: Secondary | ICD-10-CM

## 2016-07-24 DIAGNOSIS — Z5181 Encounter for therapeutic drug level monitoring: Secondary | ICD-10-CM | POA: Diagnosis not present

## 2016-07-24 MED ORDER — OXYCODONE-ACETAMINOPHEN 7.5-325 MG PO TABS: 1.0000 | ORAL_TABLET | Freq: Three times a day (TID) | ORAL | 0 refills | Status: DC | PRN
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
## 2016-08-21 ENCOUNTER — Other Ambulatory Visit: Payer: Self-pay | Admitting: Registered Nurse

## 2016-09-01 ENCOUNTER — Telehealth: Payer: Self-pay | Admitting: Registered Nurse

## 2016-09-01 ENCOUNTER — Encounter: Payer: Self-pay | Admitting: Registered Nurse

## 2016-09-01 ENCOUNTER — Encounter: Payer: Medicaid Other | Attending: Physical Medicine & Rehabilitation | Admitting: Registered Nurse

## 2016-09-01 VITALS — BP 126/81 | HR 69

## 2016-09-01 DIAGNOSIS — R51 Headache: Secondary | ICD-10-CM | POA: Insufficient documentation

## 2016-09-01 DIAGNOSIS — G894 Chronic pain syndrome: Secondary | ICD-10-CM

## 2016-09-01 DIAGNOSIS — S069X9D Unspecified intracranial injury with loss of consciousness of unspecified duration, subsequent encounter: Secondary | ICD-10-CM | POA: Insufficient documentation

## 2016-09-01 DIAGNOSIS — S069X0S Unspecified intracranial injury without loss of consciousness, sequela: Secondary | ICD-10-CM | POA: Diagnosis not present

## 2016-09-01 DIAGNOSIS — F068 Other specified mental disorders due to known physiological condition: Secondary | ICD-10-CM

## 2016-09-01 DIAGNOSIS — G8111 Spastic hemiplegia affecting right dominant side: Secondary | ICD-10-CM

## 2016-09-01 DIAGNOSIS — Z5181 Encounter for therapeutic drug level monitoring: Secondary | ICD-10-CM | POA: Diagnosis not present

## 2016-09-01 DIAGNOSIS — Z79899 Other long term (current) drug therapy: Secondary | ICD-10-CM | POA: Diagnosis not present

## 2016-09-01 DIAGNOSIS — G44329 Chronic post-traumatic headache, not intractable: Secondary | ICD-10-CM

## 2016-09-01 MED ORDER — OXYCODONE-ACETAMINOPHEN 7.5-325 MG PO TABS: 1.0000 | ORAL_TABLET | Freq: Three times a day (TID) | ORAL | 0 refills | Status: DC | PRN

## 2016-09-01 NOTE — Telephone Encounter (Signed)
On 09/01/2016 the NCCSR was reviewed no conflict was seen [place] the [place] with multiple prescribers. [name] has a signed narcotic contract with our office. If there were any discrepancies this would have been reported to his physician.
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
## 2016-10-06 ENCOUNTER — Encounter: Payer: Self-pay | Admitting: Registered Nurse

## 2016-10-06 ENCOUNTER — Encounter: Payer: Medicaid Other | Attending: Physical Medicine & Rehabilitation | Admitting: Registered Nurse

## 2016-10-06 VITALS — BP 126/72 | HR 63

## 2016-10-06 DIAGNOSIS — Z5181 Encounter for therapeutic drug level monitoring: Secondary | ICD-10-CM | POA: Diagnosis not present

## 2016-10-06 DIAGNOSIS — S069X0S Unspecified intracranial injury without loss of consciousness, sequela: Secondary | ICD-10-CM

## 2016-10-06 DIAGNOSIS — G894 Chronic pain syndrome: Secondary | ICD-10-CM

## 2016-10-06 DIAGNOSIS — F068 Other specified mental disorders due to known physiological condition: Secondary | ICD-10-CM | POA: Diagnosis not present

## 2016-10-06 DIAGNOSIS — Z79899 Other long term (current) drug therapy: Secondary | ICD-10-CM

## 2016-10-06 DIAGNOSIS — G8111 Spastic hemiplegia affecting right dominant side: Secondary | ICD-10-CM | POA: Diagnosis not present

## 2016-10-06 DIAGNOSIS — G44329 Chronic post-traumatic headache, not intractable: Secondary | ICD-10-CM

## 2016-10-06 DIAGNOSIS — S069X9D Unspecified intracranial injury with loss of consciousness of unspecified duration, subsequent encounter: Secondary | ICD-10-CM | POA: Insufficient documentation

## 2016-10-06 DIAGNOSIS — R51 Headache: Secondary | ICD-10-CM | POA: Insufficient documentation

## 2016-10-06 MED ORDER — MELOXICAM 15 MG PO TABS: 15.0000 mg | ORAL_TABLET | Freq: Every day | ORAL | 3 refills | Status: DC

## 2016-10-06 MED ORDER — OXYCODONE-ACETAMINOPHEN 7.5-325 MG PO TABS: 1.0000 | ORAL_TABLET | Freq: Three times a day (TID) | ORAL | 0 refills | Status: DC | PRN
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
## 2016-10-08 ENCOUNTER — Telehealth: Payer: Self-pay | Admitting: Registered Nurse

## 2016-10-08 DIAGNOSIS — S069X0S Unspecified intracranial injury without loss of consciousness, sequela: Principal | ICD-10-CM

## 2016-10-08 DIAGNOSIS — R4189 Other symptoms and signs involving cognitive functions and awareness: Secondary | ICD-10-CM

## 2016-10-08 DIAGNOSIS — F068 Other specified mental disorders due to known physiological condition: Secondary | ICD-10-CM

## 2016-10-08 NOTE — Telephone Encounter (Signed)
Referral Placed for Cognitive Testing with Dr. Kieth Brightly

## 2016-10-16 ENCOUNTER — Other Ambulatory Visit: Payer: Self-pay | Admitting: Physical Medicine & Rehabilitation

## 2016-10-20 NOTE — Telephone Encounter (Signed)
Patient is having trouble getting his oxycodone refilled.  Please call patient.

## 2016-10-21 ENCOUNTER — Telehealth: Payer: Self-pay | Admitting: *Deleted

## 2016-10-21 NOTE — Telephone Encounter (Signed)
Patient called and left a message stating that he is unable to pick up his medication. Contacted pharmacy, pain medication needs a prior authorization.  PA submitted to [employer], awaiting response

## 2016-11-06 ENCOUNTER — Encounter: Payer: Medicaid Other | Attending: Physical Medicine & Rehabilitation | Admitting: Registered Nurse

## 2016-11-06 ENCOUNTER — Encounter: Payer: Self-pay | Admitting: Registered Nurse

## 2016-11-06 VITALS — BP 131/85 | HR 64

## 2016-11-06 DIAGNOSIS — S069X0S Unspecified intracranial injury without loss of consciousness, sequela: Secondary | ICD-10-CM | POA: Diagnosis not present

## 2016-11-06 DIAGNOSIS — Z5181 Encounter for therapeutic drug level monitoring: Secondary | ICD-10-CM | POA: Diagnosis not present

## 2016-11-06 DIAGNOSIS — G894 Chronic pain syndrome: Secondary | ICD-10-CM

## 2016-11-06 DIAGNOSIS — G8111 Spastic hemiplegia affecting right dominant side: Secondary | ICD-10-CM

## 2016-11-06 DIAGNOSIS — S069X9D Unspecified intracranial injury with loss of consciousness of unspecified duration, subsequent encounter: Secondary | ICD-10-CM | POA: Diagnosis not present

## 2016-11-06 DIAGNOSIS — F068 Other specified mental disorders due to known physiological condition: Secondary | ICD-10-CM

## 2016-11-06 DIAGNOSIS — G44329 Chronic post-traumatic headache, not intractable: Secondary | ICD-10-CM | POA: Diagnosis not present

## 2016-11-06 DIAGNOSIS — Z79899 Other long term (current) drug therapy: Secondary | ICD-10-CM

## 2016-11-06 DIAGNOSIS — R51 Headache: Secondary | ICD-10-CM | POA: Insufficient documentation

## 2016-11-06 MED ORDER — OXYCODONE-ACETAMINOPHEN 7.5-325 MG PO TABS: 1.0000 | ORAL_TABLET | Freq: Three times a day (TID) | ORAL | 0 refills | Status: DC | PRN
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
## 2016-11-10 ENCOUNTER — Encounter (HOSPITAL_BASED_OUTPATIENT_CLINIC_OR_DEPARTMENT_OTHER): Payer: Self-pay | Admitting: *Deleted

## 2016-11-10 ENCOUNTER — Emergency Department (HOSPITAL_BASED_OUTPATIENT_CLINIC_OR_DEPARTMENT_OTHER)
Admission: EM | Admit: 2016-11-10 | Discharge: 2016-11-10 | Disposition: A | Discharge status: Home / Self Care | Payer: Medicaid Other | Attending: Emergency Medicine | Admitting: Emergency Medicine

## 2016-11-10 DIAGNOSIS — F17210 Nicotine dependence, cigarettes, uncomplicated: Secondary | ICD-10-CM | POA: Diagnosis not present

## 2016-11-10 DIAGNOSIS — M79674 Pain in right toe(s): Secondary | ICD-10-CM | POA: Diagnosis present

## 2016-11-10 DIAGNOSIS — Z79899 Other long term (current) drug therapy: Secondary | ICD-10-CM | POA: Insufficient documentation

## 2016-11-10 DIAGNOSIS — L600 Ingrowing nail: Secondary | ICD-10-CM | POA: Insufficient documentation

## 2016-11-10 HISTORY — DX: Accidental discharge from unspecified firearms or gun, initial encounter: W34.00XA

## 2016-11-10 HISTORY — DX: Unspecified intracranial injury with loss of consciousness status unknown, initial encounter: S06.9XAA

## 2016-11-10 HISTORY — DX: Unspecified injury of head, initial encounter: S09.90XA

## 2016-11-10 HISTORY — DX: Other chronic pain: G89.29

## 2016-11-10 HISTORY — DX: Long term (current) use of opiate analgesic: Z79.891

## 2016-11-10 HISTORY — DX: Unspecified intracranial injury with loss of consciousness of unspecified duration, initial encounter: S06.9X9A

## 2016-11-10 MED ORDER — HYDROCODONE-ACETAMINOPHEN 5-325 MG PO TABS
1.0000 | ORAL_TABLET | Freq: Once | ORAL | Status: AC
  Administered 2016-11-10: 1 via ORAL
  Filled 2016-11-10: qty 1

## 2016-11-10 MED ORDER — BUPIVACAINE HCL 0.5 % IJ SOLN
50.0000 mL | Freq: Once | INTRAMUSCULAR | Status: AC
  Administered 2016-11-10: 50 mL
  Filled 2016-11-10: qty 1

## 2016-11-10 MED ORDER — IBUPROFEN 600 MG PO TABS: 600.0000 mg | ORAL_TABLET | Freq: Four times a day (QID) | ORAL | 0 refills | Status: DC | PRN

## 2016-11-10 MED ORDER — SULFAMETHOXAZOLE-TRIMETHOPRIM 800-160 MG PO TABS
1.0000 | ORAL_TABLET | Freq: Once | ORAL | Status: AC
  Administered 2016-11-10: 1 via ORAL
  Filled 2016-11-10: qty 1

## 2016-11-10 MED ORDER — SULFAMETHOXAZOLE-TRIMETHOPRIM 800-160 MG PO TABS: 1.0000 | ORAL_TABLET | Freq: Two times a day (BID) | ORAL | 0 refills | Status: AC

## 2016-11-10 NOTE — ED Notes (Signed)
Assisted with toenail removal
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
## 2016-11-10 NOTE — ED Provider Notes (Signed)
.  Nail Removal Date/Time: 11/10/2016 9:49 PM Performed by: Graciella Freer A Authorized by: Graciella Freer A   Consent:    Consent obtained:  Verbal   Consent given by:  Patient   Risks discussed:  Bleeding, incomplete removal and infection Location:    Foot:  R big toe Pre-procedure details:    Skin preparation:  Betadine Anesthesia (see MAR for exact dosages):    Anesthesia method:  Nerve block Nail Removal:    Nail removed:  Complete   Nail bed repaired: no     Removed nail replaced and anchored: no   Post-procedure details:    Dressing:  Xeroform gauze   Patient tolerance of procedure:  Tolerated well, no immediate complications      Bruce [name] 11/10/16 2151    Jacalyn Lefevre, MD 11/10/16 2325
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
## 2016-12-08 ENCOUNTER — Encounter: Payer: Medicaid Other | Attending: Physical Medicine & Rehabilitation | Admitting: Psychology

## 2016-12-08 DIAGNOSIS — S069X9D Unspecified intracranial injury with loss of consciousness of unspecified duration, subsequent encounter: Secondary | ICD-10-CM | POA: Insufficient documentation

## 2016-12-08 DIAGNOSIS — R51 Headache: Secondary | ICD-10-CM | POA: Insufficient documentation

## 2016-12-09 ENCOUNTER — Telehealth: Payer: Self-pay | Admitting: Registered Nurse

## 2016-12-09 ENCOUNTER — Encounter: Payer: Medicaid Other | Admitting: Registered Nurse
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
## 2016-12-09 NOTE — Telephone Encounter (Signed)
I left a detail message on Becky Dupree SW voicemail, awaiting a response.

## 2016-12-10 ENCOUNTER — Telehealth: Payer: Self-pay | Admitting: Registered Nurse

## 2016-12-10 NOTE — Telephone Encounter (Signed)
Received a call from [name] this morning stating he called his social worker and awaiting a call back. This provider hasn't had a chance to speak to [name], she left a message. I explained the above to [name], also asked him to re-call his social worker by 11:30 if he hasn't heard from her. I will be  placing a call to [name] at 1:30, he states he is using an app to make his calls since he lost his phone. [phone]. He verbalizes understanding. He is aware that he may have to go to a shelter.

## 2016-12-10 NOTE — Telephone Encounter (Signed)
I spoke with Bruce [name], she instructed for Bruce [name] to contact his social worker in the community. She also gave this provider two shelters and their phone numbers. I placed a call to Bruce [name] no answer, left message .  [facility] [phone] and [facility] [phone]. I will try to call again since he is using an app to make calls.

## 2016-12-11 ENCOUNTER — Telehealth: Payer: Self-pay | Admitting: Registered Nurse

## 2016-12-11 ENCOUNTER — Telehealth: Payer: Self-pay | Admitting: Physical Medicine & Rehabilitation

## 2016-12-11 NOTE — Telephone Encounter (Signed)
Return Bruce [name] call,  He states he will be staying with his girlfriend until  he's able to obtain housing. He will call office on Monday 12/14/2016.

## 2016-12-11 NOTE — Telephone Encounter (Signed)
Bruce [name] would like for Bruce [name] to call him at [phone].

## 2016-12-11 NOTE — Telephone Encounter (Signed)
I placed a call to Bruce [name] yesterday evening, no answer. He left a message yesterday. I called Bruce [name] at 11;30 am today, no answer left message. Bruce [name] is using an app to make calls, will await his return call.

## 2016-12-16 ENCOUNTER — Telehealth: Payer: Self-pay | Admitting: Registered Nurse

## 2016-12-16 NOTE — Telephone Encounter (Signed)
Bruce [name] called to inform this provider he is [facility], he is in the process of obtaining a ride to keep his scheduled appointment. He Reports. Instructed to speak with the shelter's social worker, he verbalizes understanding.

## 2016-12-18 ENCOUNTER — Encounter: Payer: Medicaid Other | Attending: Physical Medicine & Rehabilitation | Admitting: Registered Nurse

## 2016-12-18 ENCOUNTER — Encounter: Payer: Medicaid Other | Admitting: Registered Nurse

## 2016-12-18 ENCOUNTER — Encounter: Payer: Self-pay | Admitting: Registered Nurse

## 2016-12-18 ENCOUNTER — Telehealth: Payer: Self-pay | Admitting: Registered Nurse

## 2016-12-18 VITALS — BP 122/86 | HR 62

## 2016-12-18 DIAGNOSIS — Z5181 Encounter for therapeutic drug level monitoring: Secondary | ICD-10-CM | POA: Diagnosis not present

## 2016-12-18 DIAGNOSIS — G894 Chronic pain syndrome: Secondary | ICD-10-CM

## 2016-12-18 DIAGNOSIS — Z79899 Other long term (current) drug therapy: Secondary | ICD-10-CM

## 2016-12-18 DIAGNOSIS — F329 Major depressive disorder, single episode, unspecified: Secondary | ICD-10-CM | POA: Diagnosis not present

## 2016-12-18 DIAGNOSIS — G8111 Spastic hemiplegia affecting right dominant side: Secondary | ICD-10-CM

## 2016-12-18 DIAGNOSIS — S069X0S Unspecified intracranial injury without loss of consciousness, sequela: Secondary | ICD-10-CM | POA: Diagnosis not present

## 2016-12-18 DIAGNOSIS — F068 Other specified mental disorders due to known physiological condition: Secondary | ICD-10-CM | POA: Diagnosis not present

## 2016-12-18 DIAGNOSIS — R51 Headache: Secondary | ICD-10-CM | POA: Diagnosis not present

## 2016-12-18 DIAGNOSIS — G44329 Chronic post-traumatic headache, not intractable: Secondary | ICD-10-CM

## 2016-12-18 DIAGNOSIS — S069X9D Unspecified intracranial injury with loss of consciousness of unspecified duration, subsequent encounter: Secondary | ICD-10-CM | POA: Insufficient documentation

## 2016-12-18 MED ORDER — OXYCODONE-ACETAMINOPHEN 7.5-325 MG PO TABS: 1.0000 | ORAL_TABLET | Freq: Three times a day (TID) | ORAL | 0 refills | Status: DC | PRN
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
## 2016-12-18 NOTE — Telephone Encounter (Signed)
On 12/18/2016 the  NCCSR was reviewed no conflict was seen on the [facility] [place] Controlled Substance Reporting System with multiple prescribers. [name]  has a signed narcotic contract with our office. If there were any discrepancies this would have been reported to his physician.

## 2016-12-24 ENCOUNTER — Telehealth: Payer: Self-pay | Admitting: *Deleted

## 2016-12-24 LAB — DRUG TOX MONITOR 1 W/CONF, ORAL FLD
Amphetamines: NEGATIVE ng/mL (ref <10)
BUPRENORPHINE: 0.27 ng/mL — AB (ref <0.10)
BUPRENORPHINE: POSITIVE ng/mL — AB (ref <0.10)
Barbiturates: NEGATIVE ng/mL (ref <10)
Benzodiazepines: NEGATIVE ng/mL (ref <0.50)
Cocaine: NEGATIVE ng/mL (ref <5.0)
Codeine: NEGATIVE ng/mL (ref <2.5)
Cotinine: >250 ng/mL — ABNORMAL HIGH (ref <5.0)
Dihydrocodeine: NEGATIVE ng/mL (ref <2.5)
Fentanyl: NEGATIVE ng/mL (ref <0.10)
HEROIN METABOLITE: NEGATIVE ng/mL (ref <1.0)
HYDROCODONE: NEGATIVE ng/mL (ref <2.5)
HYDROMORPHONE: NEGATIVE ng/mL (ref <2.5)
MARIJUANA: NEGATIVE ng/mL (ref <2.5)
MDMA: NEGATIVE ng/mL (ref <10)
MEPROBAMATE: NEGATIVE ng/mL (ref <2.5)
METHADONE: NEGATIVE ng/mL (ref <5.0)
MORPHINE: NEGATIVE ng/mL (ref <2.5)
NALOXONE: NEGATIVE ng/mL (ref <0.25)
NORBUPRENORPHINE: 1.21 ng/mL — AB (ref <0.50)
NORHYDROCODONE: NEGATIVE ng/mL (ref <2.5)
NOROXYCODONE: 42.9 ng/mL — AB (ref <2.5)
Nicotine Metabolite: POSITIVE ng/mL — AB (ref <5.0)
OPIATES: POSITIVE ng/mL — AB (ref <2.5)
Oxycodone: >250 ng/mL — ABNORMAL HIGH (ref <2.5)
Oxymorphone: NEGATIVE ng/mL (ref <2.5)
PHENCYCLIDINE: NEGATIVE ng/mL (ref <10)
Tapentadol: NEGATIVE ng/mL (ref <5.0)
Tramadol: NEGATIVE ng/mL (ref <5.0)
ZOLPIDEM: NEGATIVE ng/mL (ref <5.0)

## 2016-12-24 LAB — DRUG TOX ALC METAB W/CON, ORAL FLD: Alcohol Metabolite: NEGATIVE ng/mL (ref <25)

## 2016-12-24 NOTE — Telephone Encounter (Signed)
Oral swab drug test reveals positive buprenorphine which is not prescribed.  His oxycodone is present as well. This is an inconsistent test.

## 2016-12-29 ENCOUNTER — Encounter: Payer: Medicaid Other | Admitting: Psychology

## 2016-12-29 DIAGNOSIS — S069X0S Unspecified intracranial injury without loss of consciousness, sequela: Secondary | ICD-10-CM | POA: Diagnosis not present

## 2016-12-29 DIAGNOSIS — F068 Other specified mental disorders due to known physiological condition: Secondary | ICD-10-CM | POA: Diagnosis not present

## 2016-12-29 DIAGNOSIS — G894 Chronic pain syndrome: Secondary | ICD-10-CM | POA: Diagnosis not present

## 2016-12-29 DIAGNOSIS — G8111 Spastic hemiplegia affecting right dominant side: Secondary | ICD-10-CM

## 2016-12-29 DIAGNOSIS — R4189 Other symptoms and signs involving cognitive functions and awareness: Secondary | ICD-10-CM

## 2016-12-29 DIAGNOSIS — F329 Major depressive disorder, single episode, unspecified: Secondary | ICD-10-CM | POA: Diagnosis not present

## 2016-12-29 DIAGNOSIS — G44329 Chronic post-traumatic headache, not intractable: Secondary | ICD-10-CM

## 2016-12-29 DIAGNOSIS — S069X9D Unspecified intracranial injury with loss of consciousness of unspecified duration, subsequent encounter: Secondary | ICD-10-CM | POA: Diagnosis not present

## 2016-12-30 NOTE — Telephone Encounter (Signed)
10 mg twice daily for 1 week then 5 mg twice daily for 1 week and 5 mg daily for 1 week then off

## 2016-12-30 NOTE — Telephone Encounter (Addendum)
Since his percocet was present, will we be weaning off? And if so what will be directions, plus are we going to discharge?  He always had trouble getting to appts anyway.

## 2016-12-30 NOTE — Telephone Encounter (Signed)
I am truly sorry for his situation, however we can no longer prescribe him Percocet given the drug testing results and the living situation.

## 2016-12-31 MED ORDER — OXYCODONE-ACETAMINOPHEN 5-325 MG PO TABS: ORAL_TABLET | ORAL | 0 refills | Status: DC

## 2016-12-31 NOTE — Telephone Encounter (Signed)
That would be fine 

## 2016-12-31 NOTE — Telephone Encounter (Signed)
Is it ok to print the rx for 5/325 #49 tablets with your directions since he was on 7.5/325 or do you want it to be plain oxy? He has an appt 01/18/17 with Riley LamEunice and we will give him the RX and discharge since we are doing nothing else for him.  He is typically unreachable by phone.

## 2017-01-12 ENCOUNTER — Ambulatory Visit: Payer: Medicaid Other | Admitting: Occupational Therapy

## 2017-01-13 ENCOUNTER — Ambulatory Visit: Payer: Medicaid Other | Attending: Registered Nurse | Admitting: Occupational Therapy

## 2017-01-13 ENCOUNTER — Ambulatory Visit: Payer: Medicaid Other | Admitting: Occupational Therapy

## 2017-01-13 DIAGNOSIS — M6281 Muscle weakness (generalized): Secondary | ICD-10-CM | POA: Insufficient documentation

## 2017-01-13 DIAGNOSIS — G8113 Spastic hemiplegia affecting right nondominant side: Secondary | ICD-10-CM | POA: Diagnosis not present

## 2017-01-13 DIAGNOSIS — R208 Other disturbances of skin sensation: Secondary | ICD-10-CM | POA: Diagnosis not present
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
## 2017-01-18 ENCOUNTER — Encounter: Payer: Self-pay | Admitting: Registered Nurse

## 2017-01-18 ENCOUNTER — Encounter: Payer: Medicaid Other | Admitting: Occupational Therapy

## 2017-01-18 ENCOUNTER — Telehealth: Payer: Self-pay | Admitting: *Deleted

## 2017-01-18 ENCOUNTER — Other Ambulatory Visit: Payer: Self-pay

## 2017-01-18 ENCOUNTER — Encounter: Payer: Medicaid Other | Attending: Physical Medicine & Rehabilitation | Admitting: Registered Nurse

## 2017-01-18 DIAGNOSIS — Z79899 Other long term (current) drug therapy: Secondary | ICD-10-CM

## 2017-01-18 DIAGNOSIS — S069X9D Unspecified intracranial injury with loss of consciousness of unspecified duration, subsequent encounter: Secondary | ICD-10-CM | POA: Insufficient documentation

## 2017-01-18 DIAGNOSIS — S069X0S Unspecified intracranial injury without loss of consciousness, sequela: Secondary | ICD-10-CM | POA: Diagnosis not present

## 2017-01-18 DIAGNOSIS — F068 Other specified mental disorders due to known physiological condition: Secondary | ICD-10-CM | POA: Diagnosis not present

## 2017-01-18 DIAGNOSIS — R51 Headache: Secondary | ICD-10-CM | POA: Insufficient documentation

## 2017-01-18 DIAGNOSIS — G894 Chronic pain syndrome: Secondary | ICD-10-CM

## 2017-01-18 DIAGNOSIS — G8111 Spastic hemiplegia affecting right dominant side: Secondary | ICD-10-CM | POA: Diagnosis not present

## 2017-01-18 DIAGNOSIS — G44329 Chronic post-traumatic headache, not intractable: Secondary | ICD-10-CM

## 2017-01-18 DIAGNOSIS — Z5181 Encounter for therapeutic drug level monitoring: Secondary | ICD-10-CM

## 2017-01-18 DIAGNOSIS — F329 Major depressive disorder, single episode, unspecified: Secondary | ICD-10-CM | POA: Diagnosis not present

## 2017-01-18 NOTE — Telephone Encounter (Signed)
At the request of the patient and Riley Lamunice, I spoke with [facility] as to whether there could be a false positive on Bruce [name] oral drug test as he denies taking anything not prescribed to him. She says no because there is both parent drug buprenorphine and the metabolite norbuprenorphine. She will send an email to the toxicologist at [facility] location and have them give me a call, typically within 24 hours.
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
## 2017-01-20 NOTE — Telephone Encounter (Signed)
[facility] Agapito GamesSarah Bortack. She relates that because the screening cutoff is really low and the metabolite as well as the parent drug is present, it does indicate the medication was taken. The test did not have a second verification and it is too late to perform one on the sample as it is no longer available.

## 2017-01-28 ENCOUNTER — Encounter: Payer: Self-pay | Admitting: Occupational Therapy

## 2017-01-28 ENCOUNTER — Ambulatory Visit: Payer: Medicaid Other | Attending: Registered Nurse | Admitting: Occupational Therapy

## 2017-01-28 DIAGNOSIS — M6281 Muscle weakness (generalized): Secondary | ICD-10-CM | POA: Insufficient documentation

## 2017-01-28 DIAGNOSIS — G8113 Spastic hemiplegia affecting right nondominant side: Secondary | ICD-10-CM | POA: Diagnosis present

## 2017-01-28 DIAGNOSIS — R208 Other disturbances of skin sensation: Secondary | ICD-10-CM | POA: Insufficient documentation
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
## 2017-01-28 NOTE — Patient Instructions (Signed)
   Lie on back holding wand. Raise arms over head but not too far to keep shoulder from hurting Hold 5sec. Repeat 10 times per set.  Do 2-3 sessions per day.        Press-Up With Wand   Press wand up until elbows are straight, then reach wand over head to a pain free range. Hold 5 seconds. Repeat 10 times. Do 2-3 sessions per day.  Turn your right forearm up and down, help with your left hand, 10-20 reps 1x day.   Practice flipping playing cards, picking up checkers or dominoes with your right hand for 20 mins per day

## 2017-01-29 ENCOUNTER — Encounter: Payer: Self-pay | Admitting: Psychology

## 2017-01-29 ENCOUNTER — Ambulatory Visit: Payer: Medicaid Other | Admitting: Family Medicine

## 2017-01-29 VITALS — BP 122/86 | HR 85 | Temp 98.1°F | Resp 16 | Ht 65.0 in | Wt 110.0 lb

## 2017-01-29 DIAGNOSIS — M25561 Pain in right knee: Secondary | ICD-10-CM

## 2017-01-29 DIAGNOSIS — G5691 Unspecified mononeuropathy of right upper limb: Secondary | ICD-10-CM

## 2017-01-29 DIAGNOSIS — G8111 Spastic hemiplegia affecting right dominant side: Secondary | ICD-10-CM | POA: Diagnosis not present

## 2017-01-29 DIAGNOSIS — G8929 Other chronic pain: Secondary | ICD-10-CM | POA: Diagnosis not present

## 2017-01-29 DIAGNOSIS — R5383 Other fatigue: Secondary | ICD-10-CM

## 2017-01-29 DIAGNOSIS — Z114 Encounter for screening for human immunodeficiency virus [HIV]: Secondary | ICD-10-CM

## 2017-01-29 DIAGNOSIS — S069X0A Unspecified intracranial injury without loss of consciousness, initial encounter: Secondary | ICD-10-CM

## 2017-01-29 DIAGNOSIS — F17200 Nicotine dependence, unspecified, uncomplicated: Secondary | ICD-10-CM

## 2017-01-29 DIAGNOSIS — R4701 Aphasia: Secondary | ICD-10-CM

## 2017-01-29 DIAGNOSIS — S069X9A Unspecified intracranial injury with loss of consciousness of unspecified duration, initial encounter: Secondary | ICD-10-CM

## 2017-01-29 DIAGNOSIS — S0190XA Unspecified open wound of unspecified part of head, initial encounter: Secondary | ICD-10-CM

## 2017-01-29 MED ORDER — TIZANIDINE HCL 2 MG PO TABS: ORAL_TABLET | ORAL | 3 refills | Status: DC

## 2017-01-29 MED ORDER — GABAPENTIN 300 MG PO CAPS: 300.0000 mg | ORAL_CAPSULE | Freq: Three times a day (TID) | ORAL | 3 refills | Status: DC

## 2017-01-29 MED ORDER — MELOXICAM 15 MG PO TABS: 15.0000 mg | ORAL_TABLET | Freq: Every day | ORAL | 3 refills | Status: DC

## 2017-01-29 MED FILL — MELOXICAM 15 MG TABLET: 15 | 30 days supply | Qty: 30 | Fill #0

## 2017-01-29 MED FILL — tiZANidine HCL 4 MG TABS: 4 | 30 days supply | Qty: 30 | Fill #0

## 2017-01-29 MED FILL — GABAPENTIN 300 MG CAPSULE: 300 | 30 days supply | Qty: 90 | Fill #0
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
## 2017-01-30 LAB — TSH: TSH: 0.984 u[IU]/mL (ref 0.450–4.500)

## 2017-01-30 LAB — CBC
HEMATOCRIT: 48.9 % (ref 37.5–51.0)
HEMOGLOBIN: 16.1 g/dL (ref 13.0–17.7)
MCH: 30.5 pg (ref 26.6–33.0)
MCHC: 32.9 g/dL (ref 31.5–35.7)
MCV: 93 fL (ref 79–97)
Platelets: 230 10*3/uL (ref 150–379)
RBC: 5.28 x10E6/uL (ref 4.14–5.80)
RDW: 13.6 % (ref 12.3–15.4)
WBC: 7.9 10*3/uL (ref 3.4–10.8)

## 2017-01-30 LAB — BASIC METABOLIC PANEL
BUN/Creatinine Ratio: 34 — ABNORMAL HIGH (ref 9–20)
BUN: 23 mg/dL — AB (ref 6–20)
CALCIUM: 9.9 mg/dL (ref 8.7–10.2)
CO2: 26 mmol/L (ref 20–29)
CREATININE: 0.68 mg/dL — AB (ref 0.76–1.27)
Chloride: 99 mmol/L (ref 96–106)
GFR, EST AFRICAN AMERICAN: 141 mL/min/{1.73_m2} (ref >59)
GFR, EST NON AFRICAN AMERICAN: 122 mL/min/{1.73_m2} (ref >59)
Glucose: 166 mg/dL — ABNORMAL HIGH (ref 65–99)
Potassium: 4.1 mmol/L (ref 3.5–5.2)
Sodium: 143 mmol/L (ref 134–144)

## 2017-01-30 LAB — HIV ANTIBODY (ROUTINE TESTING W REFLEX): HIV Screen 4th Generation wRfx: NONREACTIVE

## 2017-02-02 ENCOUNTER — Ambulatory Visit: Payer: Medicaid Other | Admitting: Occupational Therapy

## 2017-02-02 DIAGNOSIS — G8113 Spastic hemiplegia affecting right nondominant side: Secondary | ICD-10-CM | POA: Diagnosis not present

## 2017-02-02 DIAGNOSIS — R208 Other disturbances of skin sensation: Secondary | ICD-10-CM

## 2017-02-02 DIAGNOSIS — M6281 Muscle weakness (generalized): Secondary | ICD-10-CM
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
## 2017-02-03 ENCOUNTER — Telehealth: Payer: Self-pay

## 2017-02-03 NOTE — Telephone Encounter (Signed)
Called and spoke with patient. He was asking about pain management referral. I advised that this was sent to [facility] yesterday and that he should be receiving a call from their office to schedule. Thanks!

## 2017-02-10 ENCOUNTER — Ambulatory Visit: Payer: Medicaid Other | Admitting: Occupational Therapy

## 2017-02-12 ENCOUNTER — Encounter: Payer: Self-pay | Admitting: Occupational Therapy

## 2017-02-19 ENCOUNTER — Telehealth: Payer: Self-pay

## 2017-02-22 NOTE — Telephone Encounter (Signed)
Called back and spoke with patient, He was asking about pain management referral and I have provided him the number to [facility], the clinic he was referred to on 01/29/2017. Thanks!

## 2017-02-23 ENCOUNTER — Encounter: Payer: Medicaid Other | Attending: Physical Medicine & Rehabilitation | Admitting: Psychology

## 2017-02-23 DIAGNOSIS — R51 Headache: Secondary | ICD-10-CM | POA: Insufficient documentation

## 2017-02-23 DIAGNOSIS — S069X9D Unspecified intracranial injury with loss of consciousness of unspecified duration, subsequent encounter: Secondary | ICD-10-CM | POA: Insufficient documentation

## 2017-02-26 DIAGNOSIS — M79604 Pain in right leg: Secondary | ICD-10-CM | POA: Diagnosis not present

## 2017-02-26 DIAGNOSIS — R51 Headache: Secondary | ICD-10-CM | POA: Diagnosis not present

## 2017-02-26 DIAGNOSIS — G501 Atypical facial pain: Secondary | ICD-10-CM | POA: Diagnosis not present

## 2017-02-26 DIAGNOSIS — G541 Lumbosacral plexus disorders: Secondary | ICD-10-CM | POA: Diagnosis not present

## 2017-02-26 DIAGNOSIS — M25551 Pain in right hip: Secondary | ICD-10-CM | POA: Diagnosis not present

## 2017-02-26 DIAGNOSIS — M79621 Pain in right upper arm: Secondary | ICD-10-CM | POA: Diagnosis not present

## 2017-02-26 DIAGNOSIS — G44321 Chronic post-traumatic headache, intractable: Secondary | ICD-10-CM | POA: Diagnosis not present

## 2017-02-26 DIAGNOSIS — M79661 Pain in right lower leg: Secondary | ICD-10-CM | POA: Diagnosis not present

## 2017-03-09 ENCOUNTER — Encounter: Payer: Medicaid Other | Admitting: Psychology

## 2017-03-23 ENCOUNTER — Encounter: Payer: Medicaid Other | Admitting: Psychology

## 2017-04-29 ENCOUNTER — Ambulatory Visit: Payer: Self-pay | Admitting: Family Medicine

## 2017-05-24 ENCOUNTER — Ambulatory Visit (INDEPENDENT_AMBULATORY_CARE_PROVIDER_SITE_OTHER): Payer: Medicaid Other | Admitting: Family Medicine

## 2017-05-24 ENCOUNTER — Encounter: Payer: Self-pay | Admitting: Family Medicine

## 2017-05-24 VITALS — BP 128/86 | HR 85 | Temp 98.4°F | Resp 16 | Ht 65.0 in | Wt 126.0 lb

## 2017-05-24 DIAGNOSIS — G8929 Other chronic pain: Secondary | ICD-10-CM

## 2017-05-24 DIAGNOSIS — M25561 Pain in right knee: Secondary | ICD-10-CM

## 2017-05-24 DIAGNOSIS — Z5900 Homelessness unspecified: Secondary | ICD-10-CM

## 2017-05-24 DIAGNOSIS — G8111 Spastic hemiplegia affecting right dominant side: Secondary | ICD-10-CM

## 2017-05-24 MED ORDER — METHOCARBAMOL 500 MG PO TABS: 500.0000 mg | ORAL_TABLET | Freq: Three times a day (TID) | ORAL | 0 refills | Status: DC | PRN

## 2017-05-24 MED ORDER — GABAPENTIN 300 MG PO CAPS: 300.0000 mg | ORAL_CAPSULE | Freq: Three times a day (TID) | ORAL | 3 refills | Status: DC

## 2017-05-24 MED ORDER — IBUPROFEN 600 MG PO TABS: 600.0000 mg | ORAL_TABLET | Freq: Three times a day (TID) | ORAL | 1 refills | Status: DC | PRN

## 2017-05-24 MED FILL — GABAPENTIN 300 MG CAPSULE: 300 | 30 days supply | Qty: 90 | Fill #0 | Status: TO

## 2017-05-24 MED FILL — IBUPROFEN 600 MG TABLET: 600 | 10 days supply | Qty: 30 | Fill #0 | Status: TO

## 2017-05-24 MED FILL — METHOCARBAMOL 500 MG TABS: 500 | 20 days supply | Qty: 60 | Fill #0
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
## 2017-05-25 NOTE — Patient Instructions (Signed)
Will send referral to [facility] for assistance with medication management, transportation constraints and referrals.

## 2017-08-23 ENCOUNTER — Ambulatory Visit: Payer: Self-pay | Admitting: Family Medicine

## 2017-09-30 ENCOUNTER — Ambulatory Visit: Payer: Self-pay | Admitting: Family Medicine

## 2018-10-26 ENCOUNTER — Encounter (HOSPITAL_COMMUNITY): Payer: Self-pay | Admitting: *Deleted

## 2018-10-26 ENCOUNTER — Encounter (HOSPITAL_COMMUNITY): Payer: Self-pay

## 2019-07-15 DIAGNOSIS — R58 Hemorrhage, not elsewhere classified: Secondary | ICD-10-CM | POA: Diagnosis not present

## 2019-07-15 DIAGNOSIS — R52 Pain, unspecified: Secondary | ICD-10-CM | POA: Diagnosis not present

## 2019-07-15 DIAGNOSIS — R1111 Vomiting without nausea: Secondary | ICD-10-CM | POA: Diagnosis not present

## 2019-07-16 ENCOUNTER — Emergency Department (HOSPITAL_COMMUNITY): Payer: Medicaid Other

## 2019-07-16 ENCOUNTER — Emergency Department (HOSPITAL_COMMUNITY): Payer: Medicaid Other | Admitting: Registered Nurse

## 2019-07-16 ENCOUNTER — Encounter (HOSPITAL_COMMUNITY)
Admission: EM | Disposition: A | Discharge status: Home / Self Care | Payer: Self-pay | Source: Home / Self Care | Attending: Emergency Medicine

## 2019-07-16 ENCOUNTER — Encounter (HOSPITAL_COMMUNITY): Payer: Self-pay | Admitting: Emergency Medicine

## 2019-07-16 ENCOUNTER — Observation Stay (HOSPITAL_COMMUNITY)
Admission: EM | Admit: 2019-07-16 | Discharge: 2019-07-16 | Disposition: A | Discharge status: Home / Self Care | Payer: Medicaid Other | Attending: Orthopedic Surgery | Admitting: Orthopedic Surgery

## 2019-07-16 DIAGNOSIS — R58 Hemorrhage, not elsewhere classified: Secondary | ICD-10-CM | POA: Diagnosis not present

## 2019-07-16 DIAGNOSIS — R531 Weakness: Secondary | ICD-10-CM | POA: Diagnosis not present

## 2019-07-16 DIAGNOSIS — Z9889 Other specified postprocedural states: Secondary | ICD-10-CM | POA: Diagnosis present

## 2019-07-16 DIAGNOSIS — W260XXA Contact with knife, initial encounter: Secondary | ICD-10-CM | POA: Diagnosis not present

## 2019-07-16 DIAGNOSIS — Z20822 Contact with and (suspected) exposure to covid-19: Secondary | ICD-10-CM | POA: Diagnosis not present

## 2019-07-16 DIAGNOSIS — S51812A Laceration without foreign body of left forearm, initial encounter: Principal | ICD-10-CM | POA: Insufficient documentation

## 2019-07-16 DIAGNOSIS — F17200 Nicotine dependence, unspecified, uncomplicated: Secondary | ICD-10-CM | POA: Diagnosis not present

## 2019-07-16 DIAGNOSIS — S56222A Laceration of other flexor muscle, fascia and tendon at forearm level, left arm, initial encounter: Secondary | ICD-10-CM | POA: Diagnosis not present

## 2019-07-16 HISTORY — PX: I & D EXTREMITY: SHX5045

## 2019-07-16 LAB — BASIC METABOLIC PANEL
Anion gap: 11 (ref 5–15)
BUN: 17 mg/dL (ref 6–20)
CO2: 21 mmol/L — ABNORMAL LOW (ref 22–32)
Calcium: 8.2 mg/dL — ABNORMAL LOW (ref 8.9–10.3)
Chloride: 107 mmol/L (ref 98–111)
Creatinine, Ser: 0.71 mg/dL (ref 0.61–1.24)
GFR calc Af Amer: >60 mL/min (ref >60)
GFR calc non Af Amer: >60 mL/min (ref >60)
Glucose, Bld: 110 mg/dL — ABNORMAL HIGH (ref 70–99)
Potassium: 3.3 mmol/L — ABNORMAL LOW (ref 3.5–5.1)
Sodium: 139 mmol/L (ref 135–145)

## 2019-07-16 LAB — CBC WITH DIFFERENTIAL/PLATELET
Abs Immature Granulocytes: 0.03 10*3/uL (ref 0.00–0.07)
Basophils Absolute: 0 10*3/uL (ref 0.0–0.1)
Basophils Relative: 1 %
Eosinophils Absolute: 0.1 10*3/uL (ref 0.0–0.5)
Eosinophils Relative: 1 %
HCT: 43.7 % (ref 39.0–52.0)
Hemoglobin: 14.6 g/dL (ref 13.0–17.0)
Immature Granulocytes: 0 %
Lymphocytes Relative: 37 %
Lymphs Abs: 2.7 10*3/uL (ref 0.7–4.0)
MCH: 31.1 pg (ref 26.0–34.0)
MCHC: 33.4 g/dL (ref 30.0–36.0)
MCV: 93 fL (ref 80.0–100.0)
Monocytes Absolute: 0.6 10*3/uL (ref 0.1–1.0)
Monocytes Relative: 8 %
Neutro Abs: 3.9 10*3/uL (ref 1.7–7.7)
Neutrophils Relative %: 53 %
Platelets: 203 10*3/uL (ref 150–400)
RBC: 4.7 MIL/uL (ref 4.22–5.81)
RDW: 12.1 % (ref 11.5–15.5)
WBC: 7.4 10*3/uL (ref 4.0–10.5)
nRBC: 0 % (ref 0.0–0.2)

## 2019-07-16 LAB — ABO/RH: ABO/RH(D): B POS

## 2019-07-16 LAB — TYPE AND SCREEN
ABO/RH(D): B POS
Antibody Screen: NEGATIVE

## 2019-07-16 LAB — I-STAT CHEM 8, ED
BUN: 19 mg/dL (ref 6–20)
Calcium, Ion: 1.09 mmol/L — ABNORMAL LOW (ref 1.15–1.40)
Chloride: 104 mmol/L (ref 98–111)
Creatinine, Ser: 0.8 mg/dL (ref 0.61–1.24)
Glucose, Bld: 116 mg/dL — ABNORMAL HIGH (ref 70–99)
HCT: 43 % (ref 39.0–52.0)
Hemoglobin: 14.6 g/dL (ref 13.0–17.0)
Potassium: 3.3 mmol/L — ABNORMAL LOW (ref 3.5–5.1)
Sodium: 141 mmol/L (ref 135–145)
TCO2: 25 mmol/L (ref 22–32)

## 2019-07-16 LAB — PROTIME-INR
INR: 0.9 (ref 0.8–1.2)
Prothrombin Time: 11.9 seconds (ref 11.4–15.2)

## 2019-07-16 LAB — SARS CORONAVIRUS 2 BY RT PCR (HOSPITAL ORDER, PERFORMED IN ~~LOC~~ HOSPITAL LAB): SARS Coronavirus 2: NEGATIVE

## 2019-07-16 SURGERY — IRRIGATION AND DEBRIDEMENT EXTREMITY
Anesthesia: General | Laterality: Left

## 2019-07-16 MED ORDER — ACETAMINOPHEN 325 MG PO TABS
650.0000 mg | ORAL_TABLET | Freq: Four times a day (QID) | ORAL | 0 refills | Status: AC
Start: 2019-07-16 — End: 2019-07-26

## 2019-07-16 MED ORDER — KETAMINE HCL 50 MG/5ML IJ SOSY
0.3000 mg/kg | PREFILLED_SYRINGE | Freq: Once | INTRAMUSCULAR | Status: AC
  Administered 2019-07-16: 17 mg via INTRAVENOUS
  Filled 2019-07-16: qty 5

## 2019-07-16 MED ORDER — PROPOFOL 10 MG/ML IV BOLUS
INTRAVENOUS | Status: AC
  Filled 2019-07-16: qty 20

## 2019-07-16 MED ORDER — MIDAZOLAM HCL 5 MG/5ML IJ SOLN
INTRAMUSCULAR | Status: DC | PRN
  Administered 2019-07-16: 2 mg via INTRAVENOUS

## 2019-07-16 MED ORDER — ROCURONIUM BROMIDE 10 MG/ML (PF) SYRINGE
PREFILLED_SYRINGE | INTRAVENOUS | Status: DC | PRN
  Administered 2019-07-16: 30 mg via INTRAVENOUS

## 2019-07-16 MED ORDER — HYDROMORPHONE HCL 1 MG/ML IJ SOLN
1.0000 mg | Freq: Once | INTRAMUSCULAR | Status: AC
  Administered 2019-07-16: 1 mg via INTRAVENOUS
  Filled 2019-07-16: qty 1

## 2019-07-16 MED ORDER — HYDROMORPHONE HCL 1 MG/ML IJ SOLN
1.0000 mg | Freq: Once | INTRAMUSCULAR | Status: AC
  Administered 2019-07-16: 1 mg via INTRAVENOUS

## 2019-07-16 MED ORDER — HYDROMORPHONE HCL 1 MG/ML IJ SOLN
0.2500 mg | Freq: Once | INTRAMUSCULAR | Status: AC
  Administered 2019-07-16: 0.5 mg via INTRAVENOUS

## 2019-07-16 MED ORDER — FENTANYL CITRATE (PF) 100 MCG/2ML IJ SOLN
INTRAMUSCULAR | Status: AC
  Administered 2019-07-16: 50 ug via INTRAVENOUS
  Filled 2019-07-16: qty 2

## 2019-07-16 MED ORDER — SUCCINYLCHOLINE CHLORIDE 200 MG/10ML IV SOSY
PREFILLED_SYRINGE | INTRAVENOUS | Status: DC | PRN
  Administered 2019-07-16: 120 mg via INTRAVENOUS

## 2019-07-16 MED ORDER — FENTANYL CITRATE (PF) 250 MCG/5ML IJ SOLN
INTRAMUSCULAR | Status: DC | PRN
  Administered 2019-07-16: 100 ug via INTRAVENOUS

## 2019-07-16 MED ORDER — IBUPROFEN 600 MG PO TABS
600.0000 mg | ORAL_TABLET | Freq: Four times a day (QID) | ORAL | Status: DC
  Administered 2019-07-16 (×2): 600 mg via ORAL
  Filled 2019-07-16 (×2): qty 1

## 2019-07-16 MED ORDER — KETAMINE HCL 10 MG/ML IJ SOLN
INTRAMUSCULAR | Status: DC | PRN
Start: 2019-07-16 — End: 2019-07-16
  Administered 2019-07-16: 30 mg via INTRAVENOUS

## 2019-07-16 MED ORDER — ACETAMINOPHEN 325 MG PO TABS
650.0000 mg | ORAL_TABLET | Freq: Four times a day (QID) | ORAL | Status: DC
  Administered 2019-07-16 (×2): 650 mg via ORAL
  Filled 2019-07-16 (×2): qty 2

## 2019-07-16 MED ORDER — PHENYLEPHRINE 40 MCG/ML (10ML) SYRINGE FOR IV PUSH (FOR BLOOD PRESSURE SUPPORT)
PREFILLED_SYRINGE | INTRAVENOUS | Status: DC | PRN
  Administered 2019-07-16: 80 ug via INTRAVENOUS

## 2019-07-16 MED ORDER — CEPHALEXIN 500 MG PO CAPS: 500.0000 mg | ORAL_CAPSULE | Freq: Four times a day (QID) | ORAL | 0 refills | Status: AC

## 2019-07-16 MED ORDER — SUGAMMADEX SODIUM 200 MG/2ML IV SOLN
INTRAVENOUS | Status: DC | PRN
  Administered 2019-07-16: 200 mg via INTRAVENOUS

## 2019-07-16 MED ORDER — PROMETHAZINE HCL 25 MG/ML IJ SOLN: 6.2500 mg | INTRAMUSCULAR | Status: DC | PRN

## 2019-07-16 MED ORDER — CEFAZOLIN SODIUM-DEXTROSE 2-4 GM/100ML-% IV SOLN
2.0000 g | Freq: Once | INTRAVENOUS | Status: AC
  Administered 2019-07-16: 2 g via INTRAVENOUS

## 2019-07-16 MED ORDER — PROPOFOL 10 MG/ML IV BOLUS
INTRAVENOUS | Status: DC | PRN
  Administered 2019-07-16: 200 mg via INTRAVENOUS

## 2019-07-16 MED ORDER — HYDROMORPHONE HCL 1 MG/ML IJ SOLN
INTRAMUSCULAR | Status: AC
  Administered 2019-07-16: 0.5 mg
  Filled 2019-07-16: qty 1

## 2019-07-16 MED ORDER — FENTANYL CITRATE (PF) 250 MCG/5ML IJ SOLN
INTRAMUSCULAR | Status: AC
  Filled 2019-07-16: qty 5

## 2019-07-16 MED ORDER — OXYCODONE HCL 5 MG PO TABS: 5.0000 mg | ORAL_TABLET | Freq: Once | ORAL | Status: DC | PRN

## 2019-07-16 MED ORDER — OXYCODONE HCL 5 MG/5ML PO SOLN: 5.0000 mg | Freq: Once | ORAL | Status: DC | PRN

## 2019-07-16 MED ORDER — TETANUS-DIPHTH-ACELL PERTUSSIS 5-2.5-18.5 LF-MCG/0.5 IM SUSP: 0.5000 mL | Freq: Once | INTRAMUSCULAR | Status: DC

## 2019-07-16 MED ORDER — HYDROMORPHONE HCL 1 MG/ML IJ SOLN
INTRAMUSCULAR | Status: AC
  Administered 2019-07-16 (×2): 0.5 mg
  Filled 2019-07-16: qty 1

## 2019-07-16 MED ORDER — OXYCODONE HCL 5 MG PO TABS
5.0000 mg | ORAL_TABLET | Freq: Four times a day (QID) | ORAL | Status: DC | PRN
  Administered 2019-07-16: 5 mg via ORAL
  Filled 2019-07-16: qty 1

## 2019-07-16 MED ORDER — LACTATED RINGERS IV SOLN: INTRAVENOUS | Status: DC | PRN

## 2019-07-16 MED ORDER — MIDAZOLAM HCL 2 MG/2ML IJ SOLN
INTRAMUSCULAR | Status: AC
  Filled 2019-07-16: qty 2

## 2019-07-16 MED ORDER — EPHEDRINE SULFATE-NACL 50-0.9 MG/10ML-% IV SOSY
PREFILLED_SYRINGE | INTRAVENOUS | Status: DC | PRN
  Administered 2019-07-16 (×3): 5 mg via INTRAVENOUS

## 2019-07-16 MED ORDER — 0.9 % SODIUM CHLORIDE (POUR BTL) OPTIME
TOPICAL | Status: DC | PRN
  Administered 2019-07-16: 1000 mL

## 2019-07-16 MED ORDER — ALBUMIN HUMAN 5 % IV SOLN: INTRAVENOUS | Status: DC | PRN

## 2019-07-16 MED ORDER — OXYCODONE HCL 5 MG PO TABS: 5.0000 mg | ORAL_TABLET | Freq: Four times a day (QID) | ORAL | 0 refills | Status: DC | PRN

## 2019-07-16 MED ORDER — LIDOCAINE-EPINEPHRINE (PF) 2 %-1:200000 IJ SOLN
INTRAMUSCULAR | Status: AC
  Filled 2019-07-16: qty 20

## 2019-07-16 MED ORDER — ONDANSETRON HCL 4 MG/2ML IJ SOLN
INTRAMUSCULAR | Status: DC | PRN
  Administered 2019-07-16: 4 mg via INTRAVENOUS

## 2019-07-16 MED ORDER — KETAMINE HCL 50 MG/5ML IJ SOSY
PREFILLED_SYRINGE | INTRAMUSCULAR | Status: AC
  Filled 2019-07-16: qty 5

## 2019-07-16 MED ORDER — IBUPROFEN 200 MG PO TABS
600.0000 mg | ORAL_TABLET | Freq: Four times a day (QID) | ORAL | 0 refills | Status: AC
Start: 2019-07-16 — End: 2019-07-26

## 2019-07-16 MED ORDER — FENTANYL CITRATE (PF) 100 MCG/2ML IJ SOLN
25.0000 ug | INTRAMUSCULAR | Status: DC | PRN
  Administered 2019-07-16: 50 ug via INTRAVENOUS

## 2019-07-16 MED ORDER — CEFAZOLIN SODIUM-DEXTROSE 2-4 GM/100ML-% IV SOLN
2.0000 g | Freq: Three times a day (TID) | INTRAVENOUS | Status: AC
  Administered 2019-07-16 (×2): 2 g via INTRAVENOUS
  Filled 2019-07-16 (×2): qty 100

## 2019-07-16 MED ORDER — CEFAZOLIN SODIUM-DEXTROSE 2-4 GM/100ML-% IV SOLN: 2.0000 g | Freq: Once | INTRAVENOUS | Status: DC

## 2019-07-16 MED ORDER — DEXAMETHASONE SODIUM PHOSPHATE 10 MG/ML IJ SOLN
INTRAMUSCULAR | Status: DC | PRN
  Administered 2019-07-16: 10 mg via INTRAVENOUS

## 2019-07-16 MED ORDER — FENTANYL CITRATE (PF) 100 MCG/2ML IJ SOLN: 25.0000 ug | Freq: Once | INTRAMUSCULAR | Status: DC

## 2019-07-16 MED ORDER — LIDOCAINE 2% (20 MG/ML) 5 ML SYRINGE
INTRAMUSCULAR | Status: DC | PRN
  Administered 2019-07-16: 60 mg via INTRAVENOUS

## 2019-07-16 MED ORDER — ONDANSETRON HCL 4 MG/2ML IJ SOLN
4.0000 mg | Freq: Once | INTRAMUSCULAR | Status: AC
  Administered 2019-07-16: 4 mg via INTRAVENOUS

## 2019-07-16 SURGICAL SUPPLY — 43 items
BAND RUBBER #18 3X1/16 STRL (MISCELLANEOUS) IMPLANT
BLADE SURG 15 STRL LF DISP TIS (BLADE) ×1 IMPLANT
BLADE SURG 15 STRL SS (BLADE) ×3
BNDG COHESIVE 2X5 TAN STRL LF (GAUZE/BANDAGES/DRESSINGS) IMPLANT
BNDG COHESIVE 4X5 TAN STRL (GAUZE/BANDAGES/DRESSINGS) ×3 IMPLANT
BNDG GAUZE ELAST 4 BULKY (GAUZE/BANDAGES/DRESSINGS) ×3 IMPLANT
CHLORAPREP W/TINT 26 (MISCELLANEOUS) ×3 IMPLANT
CORD BIPOLAR FORCEPS 12FT (ELECTRODE) ×3 IMPLANT
COVER WAND RF STERILE (DRAPES) ×3 IMPLANT
CUFF TOURN SGL QUICK 18X4 (TOURNIQUET CUFF) ×3 IMPLANT
DRAPE SURG 17X23 STRL (DRAPES) ×3 IMPLANT
DRSG ADAPTIC 3X8 NADH LF (GAUZE/BANDAGES/DRESSINGS) ×3 IMPLANT
DRSG EMULSION OIL 3X3 NADH (GAUZE/BANDAGES/DRESSINGS) ×3 IMPLANT
GAUZE SPONGE 4X4 12PLY STRL (GAUZE/BANDAGES/DRESSINGS) ×3 IMPLANT
GLOVE BIO SURGEON STRL SZ7.5 (GLOVE) ×3 IMPLANT
GLOVE BIOGEL PI IND STRL 7.0 (GLOVE) ×1 IMPLANT
GLOVE BIOGEL PI IND STRL 8 (GLOVE) ×1 IMPLANT
GLOVE BIOGEL PI INDICATOR 7.0 (GLOVE) ×2
GLOVE BIOGEL PI INDICATOR 8 (GLOVE) ×2
GLOVE ECLIPSE 6.5 STRL STRAW (GLOVE) ×3 IMPLANT
GOWN STRL REUS W/ TWL LRG LVL3 (GOWN DISPOSABLE) ×3 IMPLANT
GOWN STRL REUS W/ TWL XL LVL3 (GOWN DISPOSABLE) ×1 IMPLANT
GOWN STRL REUS W/TWL LRG LVL3 (GOWN DISPOSABLE) ×9
GOWN STRL REUS W/TWL XL LVL3 (GOWN DISPOSABLE) ×3
KIT BASIN OR (CUSTOM PROCEDURE TRAY) ×3 IMPLANT
NEEDLE HYPO 25X1 1.5 SAFETY (NEEDLE) IMPLANT
NS IRRIG 1000ML POUR BTL (IV SOLUTION) ×3 IMPLANT
PACK ORTHO EXTREMITY (CUSTOM PROCEDURE TRAY) ×3 IMPLANT
PAD CAST 4YDX4 CTTN HI CHSV (CAST SUPPLIES) ×1 IMPLANT
PADDING CAST ABS 4INX4YD NS (CAST SUPPLIES)
PADDING CAST ABS COTTON 4X4 ST (CAST SUPPLIES) IMPLANT
PADDING CAST COTTON 4X4 STRL (CAST SUPPLIES) ×3
SET IRRIG Y TYPE TUR BLADDER L (SET/KITS/TRAYS/PACK) IMPLANT
SPLINT FIBERGLASS 3X12 (CAST SUPPLIES) ×3 IMPLANT
STAPLER VISISTAT 35W (STAPLE) ×3 IMPLANT
STOCKINETTE 6  STRL (DRAPES) ×3
STOCKINETTE 6 STRL (DRAPES) ×1 IMPLANT
SUT ETHIBOND CT1 BRD 2-0 30IN (SUTURE) ×9 IMPLANT
SUT VIC AB 3-0 PS1 18 (SUTURE) ×3
SUT VIC AB 3-0 PS1 18XBRD (SUTURE) ×1 IMPLANT
SUT VICRYL RAPIDE 4/0 PS 2 (SUTURE) ×3 IMPLANT
SYR 10ML LL (SYRINGE) IMPLANT
UNDERPAD 30X36 HEAVY ABSORB (UNDERPADS AND DIAPERS) ×3 IMPLANT
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
## 2019-07-16 NOTE — Anesthesia Procedure Notes (Signed)
Procedure Name: Intubation Date/Time: 07/16/2019 1:31 AM Performed by: Zollie Scale, CRNA Pre-anesthesia Checklist: Patient identified, Emergency Drugs available, Suction available and Patient being monitored Patient Re-evaluated:Patient Re-evaluated prior to induction Oxygen Delivery Method: Circle System Utilized Preoxygenation: Pre-oxygenation with 100% oxygen Induction Type: IV induction, Rapid sequence and Cricoid Pressure applied Laryngoscope Size: Glidescope and 4 Grade View: Grade I Tube type: Oral Tube size: 7.5 mm Number of attempts: 1 Airway Equipment and Method: Stylet and Video-laryngoscopy Placement Confirmation: ETT inserted through vocal cords under direct vision,  positive ETCO2 and breath sounds checked- equal and bilateral Secured at: 22 cm Tube secured with: Tape Dental Injury: Teeth and Oropharynx as per pre-operative assessment  Comments: Glidescope utilized d/t unknown COVID status.
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
## 2019-07-16 NOTE — Progress Notes (Signed)
Unable to locate patient emergency contacts. No phone with patient. Unable to remember phone numbers. Spoke with ED and AC and no resolution.
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
## 2019-07-16 NOTE — Progress Notes (Signed)
Spoke to [employer] sergent to try and locate patients phone and was unsuccessful. Patient's parents are deceased and living house to house. Tried to contact case management but they have left for the night. I called Dr. Janee Morn to request patient is admitted due to no support person for 24 hours following anesthesia and pain control.

## 2019-07-16 NOTE — Care Management (Signed)
Thorough search of the ED including the soiled utility area revealed no personal belongings for [name] OR to confirm if they had any belongings. Awaiting to hear back.
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
## 2019-07-16 NOTE — Progress Notes (Signed)
Patient has been asking about his belongings since the beginning of the shift, it was reported to me that ED nurse has checked and called [employer] regarding pt's claimed belongings which is his wallet, cell phone and clothing. I have called security and ED personnel about it, but none of his belongings was found.
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
## 2019-07-16 NOTE — Progress Notes (Signed)
Patient discharged home as ordered via taxi.

## 2019-07-16 NOTE — ED Notes (Signed)
Dr. Thompson at bedside. 
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
## 2019-07-16 NOTE — Discharge Instructions (Signed)
Keep your bandages on until returning to the office.  The bandage has some hard material within it to keep your wrist slightly bent, to protect the tendon repairs.  It is important that you leave it on.  You should work on moving your fingers, however, bending them into a full fist and then straightening them again.  Take your antibiotics (cephalexin) until gone to help prevent infection
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
## 2019-07-18 ENCOUNTER — Encounter: Payer: Self-pay | Admitting: Family Medicine

## 2019-07-25 DIAGNOSIS — S51812D Laceration without foreign body of left forearm, subsequent encounter: Secondary | ICD-10-CM | POA: Diagnosis not present

## 2020-02-01 ENCOUNTER — Telehealth (INDEPENDENT_AMBULATORY_CARE_PROVIDER_SITE_OTHER): Payer: Medicaid Other | Admitting: Primary Care

## 2020-10-18 ENCOUNTER — Ambulatory Visit: Payer: Self-pay | Admitting: Nurse Practitioner

## 2020-10-28 ENCOUNTER — Encounter: Payer: Self-pay | Admitting: Nurse Practitioner

## 2020-10-28 ENCOUNTER — Other Ambulatory Visit (HOSPITAL_COMMUNITY): Payer: Self-pay

## 2020-10-28 ENCOUNTER — Ambulatory Visit (INDEPENDENT_AMBULATORY_CARE_PROVIDER_SITE_OTHER): Payer: Medicaid Other | Admitting: Nurse Practitioner

## 2020-10-28 ENCOUNTER — Other Ambulatory Visit: Payer: Self-pay

## 2020-10-28 VITALS — BP 135/91 | HR 67 | Temp 97.2°F | Ht 65.0 in | Wt 118.0 lb

## 2020-10-28 DIAGNOSIS — Z131 Encounter for screening for diabetes mellitus: Secondary | ICD-10-CM

## 2020-10-28 DIAGNOSIS — Z0000 Encounter for general adult medical examination without abnormal findings: Secondary | ICD-10-CM | POA: Diagnosis not present

## 2020-10-28 DIAGNOSIS — M79602 Pain in left arm: Secondary | ICD-10-CM | POA: Diagnosis not present

## 2020-10-28 LAB — POCT GLYCOSYLATED HEMOGLOBIN (HGB A1C): Hemoglobin A1C: 5.3 % (ref 4.0–5.6)

## 2020-10-28 LAB — POCT URINALYSIS DIP (CLINITEK)
Bilirubin, UA: NEGATIVE
Blood, UA: NEGATIVE
Glucose, UA: NEGATIVE mg/dL
Ketones, POC UA: NEGATIVE mg/dL
Leukocytes, UA: NEGATIVE
Nitrite, UA: NEGATIVE
POC PROTEIN,UA: NEGATIVE
Spec Grav, UA: >=1.03 — AB (ref 1.010–1.025)
Urobilinogen, UA: 0.2 E.U./dL
pH, UA: 6 (ref 5.0–8.0)

## 2020-10-28 LAB — GLUCOSE, POCT (MANUAL RESULT ENTRY): POC Glucose: 106 mg/dl — AB (ref 70–99)

## 2020-10-28 MED ORDER — TRAMADOL HCL 50 MG PO TABS
50.0000 mg | ORAL_TABLET | Freq: Three times a day (TID) | ORAL | 0 refills | Status: AC | PRN
  Filled 2020-10-28: qty 15, 5d supply, fill #0
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
## 2020-10-28 NOTE — Patient Instructions (Signed)
You were seen today in the [facility] to establish care and pain management. Labs were collected, results will be available via MyChart or, if abnormal, you will be contacted by clinic staff. You were prescribed medications, please take as directed. Referral was sent to pain clinic, please follow up with their office for long term management of your pain.  Please follow up in 6 mths for reevaluation.

## 2020-11-08 ENCOUNTER — Other Ambulatory Visit: Payer: Self-pay

## 2020-11-28 DIAGNOSIS — R413 Other amnesia: Secondary | ICD-10-CM | POA: Diagnosis not present

## 2020-11-28 DIAGNOSIS — Z1159 Encounter for screening for other viral diseases: Secondary | ICD-10-CM | POA: Diagnosis not present

## 2020-11-28 DIAGNOSIS — Z0000 Encounter for general adult medical examination without abnormal findings: Secondary | ICD-10-CM | POA: Diagnosis not present

## 2020-11-28 DIAGNOSIS — Z79899 Other long term (current) drug therapy: Secondary | ICD-10-CM | POA: Diagnosis not present

## 2020-11-28 DIAGNOSIS — Z87820 Personal history of traumatic brain injury: Secondary | ICD-10-CM | POA: Diagnosis not present

## 2020-11-28 DIAGNOSIS — Z114 Encounter for screening for human immunodeficiency virus [HIV]: Secondary | ICD-10-CM | POA: Diagnosis not present

## 2020-12-12 DIAGNOSIS — R768 Other specified abnormal immunological findings in serum: Secondary | ICD-10-CM | POA: Diagnosis not present

## 2020-12-12 DIAGNOSIS — Z87820 Personal history of traumatic brain injury: Secondary | ICD-10-CM | POA: Diagnosis not present

## 2020-12-12 DIAGNOSIS — R7303 Prediabetes: Secondary | ICD-10-CM | POA: Diagnosis not present

## 2020-12-12 DIAGNOSIS — F068 Other specified mental disorders due to known physiological condition: Secondary | ICD-10-CM | POA: Diagnosis not present

## 2020-12-30 DIAGNOSIS — R413 Other amnesia: Secondary | ICD-10-CM | POA: Diagnosis not present

## 2020-12-30 DIAGNOSIS — Z87820 Personal history of traumatic brain injury: Secondary | ICD-10-CM | POA: Diagnosis not present

## 2021-01-20 DIAGNOSIS — B181 Chronic viral hepatitis B without delta-agent: Secondary | ICD-10-CM | POA: Diagnosis not present

## 2021-01-20 DIAGNOSIS — R7303 Prediabetes: Secondary | ICD-10-CM | POA: Diagnosis not present

## 2021-01-20 DIAGNOSIS — Z79899 Other long term (current) drug therapy: Secondary | ICD-10-CM | POA: Diagnosis not present

## 2021-01-20 DIAGNOSIS — Z789 Other specified health status: Secondary | ICD-10-CM | POA: Diagnosis not present

## 2021-01-20 DIAGNOSIS — R52 Pain, unspecified: Secondary | ICD-10-CM | POA: Diagnosis not present

## 2021-01-31 DIAGNOSIS — R52 Pain, unspecified: Secondary | ICD-10-CM | POA: Diagnosis not present

## 2021-01-31 DIAGNOSIS — B181 Chronic viral hepatitis B without delta-agent: Secondary | ICD-10-CM | POA: Diagnosis not present

## 2021-01-31 DIAGNOSIS — Z79899 Other long term (current) drug therapy: Secondary | ICD-10-CM | POA: Diagnosis not present

## 2021-01-31 DIAGNOSIS — R7303 Prediabetes: Secondary | ICD-10-CM | POA: Diagnosis not present

## 2021-02-07 DIAGNOSIS — B181 Chronic viral hepatitis B without delta-agent: Secondary | ICD-10-CM | POA: Diagnosis not present

## 2021-02-07 DIAGNOSIS — Z79899 Other long term (current) drug therapy: Secondary | ICD-10-CM | POA: Diagnosis not present

## 2021-02-07 DIAGNOSIS — R768 Other specified abnormal immunological findings in serum: Secondary | ICD-10-CM | POA: Diagnosis not present

## 2021-02-07 DIAGNOSIS — R7303 Prediabetes: Secondary | ICD-10-CM | POA: Diagnosis not present

## 2021-03-04 DIAGNOSIS — R7303 Prediabetes: Secondary | ICD-10-CM | POA: Diagnosis not present

## 2021-03-04 DIAGNOSIS — R52 Pain, unspecified: Secondary | ICD-10-CM | POA: Diagnosis not present

## 2021-03-04 DIAGNOSIS — R768 Other specified abnormal immunological findings in serum: Secondary | ICD-10-CM | POA: Diagnosis not present

## 2021-03-04 DIAGNOSIS — Z79899 Other long term (current) drug therapy: Secondary | ICD-10-CM | POA: Diagnosis not present

## 2021-03-04 DIAGNOSIS — B181 Chronic viral hepatitis B without delta-agent: Secondary | ICD-10-CM | POA: Diagnosis not present

## 2021-03-07 DIAGNOSIS — M25551 Pain in right hip: Secondary | ICD-10-CM | POA: Diagnosis not present

## 2021-03-07 DIAGNOSIS — M25611 Stiffness of right shoulder, not elsewhere classified: Secondary | ICD-10-CM | POA: Diagnosis not present

## 2021-03-07 DIAGNOSIS — M25511 Pain in right shoulder: Secondary | ICD-10-CM | POA: Diagnosis not present

## 2021-03-07 DIAGNOSIS — M79602 Pain in left arm: Secondary | ICD-10-CM | POA: Diagnosis not present

## 2021-03-07 DIAGNOSIS — M25561 Pain in right knee: Secondary | ICD-10-CM | POA: Diagnosis not present

## 2021-03-07 DIAGNOSIS — Z79899 Other long term (current) drug therapy: Secondary | ICD-10-CM | POA: Diagnosis not present

## 2021-03-07 DIAGNOSIS — M25651 Stiffness of right hip, not elsewhere classified: Secondary | ICD-10-CM | POA: Diagnosis not present

## 2021-03-07 DIAGNOSIS — G8929 Other chronic pain: Secondary | ICD-10-CM | POA: Diagnosis not present

## 2021-03-20 DIAGNOSIS — M25551 Pain in right hip: Secondary | ICD-10-CM | POA: Diagnosis not present

## 2021-03-20 DIAGNOSIS — M25611 Stiffness of right shoulder, not elsewhere classified: Secondary | ICD-10-CM | POA: Diagnosis not present

## 2021-03-20 DIAGNOSIS — M25511 Pain in right shoulder: Secondary | ICD-10-CM | POA: Diagnosis not present

## 2021-03-20 DIAGNOSIS — M25651 Stiffness of right hip, not elsewhere classified: Secondary | ICD-10-CM | POA: Diagnosis not present

## 2021-03-25 DIAGNOSIS — M25511 Pain in right shoulder: Secondary | ICD-10-CM | POA: Diagnosis not present

## 2021-03-25 DIAGNOSIS — M25611 Stiffness of right shoulder, not elsewhere classified: Secondary | ICD-10-CM | POA: Diagnosis not present

## 2021-03-25 DIAGNOSIS — M25651 Stiffness of right hip, not elsewhere classified: Secondary | ICD-10-CM | POA: Diagnosis not present

## 2021-03-25 DIAGNOSIS — M25551 Pain in right hip: Secondary | ICD-10-CM | POA: Diagnosis not present

## 2021-03-27 DIAGNOSIS — M25651 Stiffness of right hip, not elsewhere classified: Secondary | ICD-10-CM | POA: Diagnosis not present

## 2021-03-27 DIAGNOSIS — M25511 Pain in right shoulder: Secondary | ICD-10-CM | POA: Diagnosis not present

## 2021-03-27 DIAGNOSIS — M25611 Stiffness of right shoulder, not elsewhere classified: Secondary | ICD-10-CM | POA: Diagnosis not present

## 2021-03-27 DIAGNOSIS — M25551 Pain in right hip: Secondary | ICD-10-CM | POA: Diagnosis not present

## 2021-03-28 DIAGNOSIS — G8929 Other chronic pain: Secondary | ICD-10-CM | POA: Diagnosis not present

## 2021-03-28 DIAGNOSIS — M79602 Pain in left arm: Secondary | ICD-10-CM | POA: Diagnosis not present

## 2021-03-28 DIAGNOSIS — Z79899 Other long term (current) drug therapy: Secondary | ICD-10-CM | POA: Diagnosis not present

## 2021-03-28 DIAGNOSIS — M25561 Pain in right knee: Secondary | ICD-10-CM | POA: Diagnosis not present

## 2021-04-03 DIAGNOSIS — M25551 Pain in right hip: Secondary | ICD-10-CM | POA: Diagnosis not present

## 2021-04-03 DIAGNOSIS — M25651 Stiffness of right hip, not elsewhere classified: Secondary | ICD-10-CM | POA: Diagnosis not present

## 2021-04-03 DIAGNOSIS — M25611 Stiffness of right shoulder, not elsewhere classified: Secondary | ICD-10-CM | POA: Diagnosis not present

## 2021-04-03 DIAGNOSIS — M25511 Pain in right shoulder: Secondary | ICD-10-CM | POA: Diagnosis not present

## 2021-04-08 DIAGNOSIS — M25651 Stiffness of right hip, not elsewhere classified: Secondary | ICD-10-CM | POA: Diagnosis not present

## 2021-04-08 DIAGNOSIS — M25551 Pain in right hip: Secondary | ICD-10-CM | POA: Diagnosis not present

## 2021-04-08 DIAGNOSIS — M25611 Stiffness of right shoulder, not elsewhere classified: Secondary | ICD-10-CM | POA: Diagnosis not present

## 2021-04-08 DIAGNOSIS — M25511 Pain in right shoulder: Secondary | ICD-10-CM | POA: Diagnosis not present

## 2021-04-10 DIAGNOSIS — M25651 Stiffness of right hip, not elsewhere classified: Secondary | ICD-10-CM | POA: Diagnosis not present

## 2021-04-10 DIAGNOSIS — M25511 Pain in right shoulder: Secondary | ICD-10-CM | POA: Diagnosis not present

## 2021-04-10 DIAGNOSIS — M25551 Pain in right hip: Secondary | ICD-10-CM | POA: Diagnosis not present

## 2021-04-10 DIAGNOSIS — M25611 Stiffness of right shoulder, not elsewhere classified: Secondary | ICD-10-CM | POA: Diagnosis not present

## 2021-04-15 DIAGNOSIS — M25651 Stiffness of right hip, not elsewhere classified: Secondary | ICD-10-CM | POA: Diagnosis not present

## 2021-04-15 DIAGNOSIS — M25511 Pain in right shoulder: Secondary | ICD-10-CM | POA: Diagnosis not present

## 2021-04-15 DIAGNOSIS — M25551 Pain in right hip: Secondary | ICD-10-CM | POA: Diagnosis not present

## 2021-04-15 DIAGNOSIS — M25611 Stiffness of right shoulder, not elsewhere classified: Secondary | ICD-10-CM | POA: Diagnosis not present

## 2021-04-25 DIAGNOSIS — M792 Neuralgia and neuritis, unspecified: Secondary | ICD-10-CM | POA: Diagnosis not present

## 2021-04-25 DIAGNOSIS — M25551 Pain in right hip: Secondary | ICD-10-CM | POA: Diagnosis not present

## 2021-04-25 DIAGNOSIS — M25511 Pain in right shoulder: Secondary | ICD-10-CM | POA: Diagnosis not present

## 2021-04-25 DIAGNOSIS — G8929 Other chronic pain: Secondary | ICD-10-CM | POA: Diagnosis not present

## 2021-04-25 DIAGNOSIS — M25651 Stiffness of right hip, not elsewhere classified: Secondary | ICD-10-CM | POA: Diagnosis not present

## 2021-04-25 DIAGNOSIS — M25611 Stiffness of right shoulder, not elsewhere classified: Secondary | ICD-10-CM | POA: Diagnosis not present

## 2021-04-28 ENCOUNTER — Encounter (HOSPITAL_COMMUNITY): Payer: Self-pay | Admitting: Emergency Medicine

## 2021-04-28 ENCOUNTER — Other Ambulatory Visit: Payer: Self-pay

## 2021-04-28 ENCOUNTER — Encounter: Payer: Medicaid Other | Admitting: Nurse Practitioner

## 2021-04-28 ENCOUNTER — Ambulatory Visit: Payer: Self-pay | Admitting: Nurse Practitioner

## 2021-04-28 ENCOUNTER — Emergency Department (HOSPITAL_COMMUNITY)
Admission: EM | Admit: 2021-04-28 | Discharge: 2021-04-28 | Disposition: A | Discharge status: Home / Self Care | Payer: Medicaid Other | Attending: Emergency Medicine | Admitting: Emergency Medicine

## 2021-04-28 DIAGNOSIS — M25511 Pain in right shoulder: Secondary | ICD-10-CM | POA: Insufficient documentation

## 2021-04-28 DIAGNOSIS — G8929 Other chronic pain: Secondary | ICD-10-CM | POA: Diagnosis not present

## 2021-04-28 DIAGNOSIS — F909 Attention-deficit hyperactivity disorder, unspecified type: Secondary | ICD-10-CM | POA: Insufficient documentation

## 2021-04-28 MED ORDER — OXYCODONE-ACETAMINOPHEN 5-325 MG PO TABS
1.0000 | ORAL_TABLET | Freq: Once | ORAL | Status: AC
  Administered 2021-04-28: 1 via ORAL
  Filled 2021-04-28: qty 1

## 2021-04-28 NOTE — ED Triage Notes (Signed)
Patient requesting refill for his Oxycodone tabs . ?

## 2021-04-28 NOTE — ED Notes (Signed)
Discharge instructions reviewed with patient. Patient verbalized understanding of instructions. Follow-up care and medications were reviewed. Patient ambulatory with steady gait. VSS upon discharge.  ?

## 2021-04-28 NOTE — ED Provider Notes (Cosign Needed)
[facility] EMERGENCY DEPARTMENT Provider Note   CSN: [id number] Arrival date & time: 04/28/21  1910     History  Chief Complaint  Patient presents with   Prescription Refill    Terin Cragle Haskew is a 41 y.o. male.  41 year old male with chronic pain of the right shoulder presents to the emergency department requesting a refill of his oxycodone.  This was previously being filled monthly through [facility].  He was referred to a pain clinic and had follow-up 3 days ago.  Was recommended for a nerve block, but did not receive any refills of his pain prescription.  Reports that he ran out of this medication earlier today and his pain has been constant, severe.  Went to [facility] primary care today and reports he was told to present to the urgent care for a refill.  The history is provided by the patient. No language interpreter was used.      Home Medications Prior to Admission medications   Medication Sig Start Date End Date Taking? Authorizing Provider  gabapentin (NEURONTIN) 300 MG capsule Take 1 capsule (300 mg total) by mouth 3 (three) times daily. Patient not taking: Reported on 10/28/2020 05/24/17   Massie Maroon, FNP  ibuprofen (ADVIL,MOTRIN) 600 MG tablet Take 1 tablet (600 mg total) by mouth every 8 (eight) hours as needed. Patient not taking: Reported on 10/28/2020 05/24/17   Massie Maroon, FNP  methocarbamol (ROBAXIN) 500 MG tablet Take 1 tablet (500 mg total) by mouth every 8 (eight) hours as needed for muscle spasms. Patient not taking: Reported on 10/28/2020 05/24/17   Massie Maroon, FNP      Allergies    Patient has no known allergies.    Review of Systems   Review of Systems Ten systems reviewed and are negative for acute change, except as noted in the HPI.    Physical Exam Updated Vital Signs BP 130/87 (BP Location: Right Arm)    Pulse 61    Temp 98.3 F (36.8 C) (Oral)    Resp 16    SpO2 100%   Physical Exam Vitals  and nursing note reviewed.  Constitutional:      General: He is not in acute distress.    Appearance: He is well-developed. He is not diaphoretic.  HENT:     Head: Normocephalic and atraumatic.  Eyes:     General: No scleral icterus.    Conjunctiva/sclera: Conjunctivae normal.  Pulmonary:     Effort: Pulmonary effort is normal. No respiratory distress.  Musculoskeletal:        General: Normal range of motion.     Cervical back: Normal range of motion.  Skin:    General: Skin is warm and dry.     Coloration: Skin is not pale.     Findings: No erythema or rash.  Neurological:     Mental Status: He is alert and oriented to person, place, and time.     Comments: Moving extremities spontaneously  Psychiatric:        Mood and Affect: Mood is anxious.        Speech: Speech is rapid and pressured.        Behavior: Behavior is hyperactive. Behavior is cooperative.    ED Results / Procedures / Treatments   Labs (all labs ordered are listed, but only abnormal results are displayed) Labs Reviewed - No data to display  EKG None  Radiology No results found.  Procedures Procedures  Medications Ordered in ED Medications  oxyCODONE-acetaminophen (PERCOCET/ROXICET) 5-325 MG per tablet 1 tablet (has no administration in time range)    ED Course/ Medical Decision Making/ A&P                           Medical Decision Making Risk Prescription drug management.   41 year old male presenting to the emergency department requesting a refill of his chronic oxycodone prescription.  I have explained to the patient that we do not refill narcotics for chronic pain.  He has been referred back to his primary care doctor or the pain clinic where he had a visit 3 days ago.  Neurovascularly intact and moving all extremities during bedside assessment.  No indication for further emergent evaluation.  Patient discharged in stable condition.        Final Clinical Impression(s) / ED  Diagnoses Final diagnoses:  Other chronic pain    Rx / DC Orders ED Discharge Orders     None         Antony Madura, PA-C 04/28/21 2227

## 2021-04-28 NOTE — ED Provider Triage Note (Signed)
Emergency Medicine Provider Triage Evaluation Note ? ?[facility] [name] , a 41 y.o. male  was evaluated in triage.  Pt complains of med refill. Needs refill of his oxycodone. ? ?Review of Systems  ?Positive: N/a ?Negative: N/a ? ?Physical Exam  ?BP 130/87 (BP Location: Right Arm)   Pulse 61   Temp 98.3 ?F (36.8 ?C) (Oral)   Resp 16   SpO2 100%  ?Gen:   Awake, no distress   ?Resp:  Normal effort  ?MSK:   Moves extremities without difficulty  ? ? ?Medical Decision Making  ?Medically screening exam initiated at 7:37 PM.  Appropriate orders placed.  Bruce [name] was informed that the remainder of the evaluation will be completed by another provider, this initial triage assessment does not replace that evaluation, and the importance of remaining in the ED until their evaluation is complete. ? ? ?  ?Karrie Meres, PA-C ?04/28/21 1945 ? ?

## 2021-04-29 ENCOUNTER — Telehealth: Payer: Self-pay

## 2021-04-29 DIAGNOSIS — F068 Other specified mental disorders due to known physiological condition: Secondary | ICD-10-CM
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
## 2021-04-29 NOTE — Progress Notes (Signed)
This encounter was created in error - please disregard.

## 2021-05-01 ENCOUNTER — Encounter: Payer: Self-pay | Admitting: *Deleted

## 2021-05-05 ENCOUNTER — Ambulatory Visit: Payer: Self-pay

## 2021-05-08 ENCOUNTER — Other Ambulatory Visit: Payer: Self-pay

## 2021-05-08 NOTE — Patient Instructions (Signed)
Visit Information ? ?Bruce [name] was given information about Medicaid Managed Care team care coordination services as a part of their [facility] Community Plan Medicaid benefit. [name] verbally consented to engagement with the [facility] Managed Care team.  ? ?If you are experiencing a medical emergency, please call 911 or report to your local emergency department or urgent care.  ? ?If you have a non-emergency medical problem during routine business hours, please contact your provider's office and ask to speak with a nurse.  ? ?For questions related to your [facility], please call: [phone] or visit the homepage here: [email or web address] ? ?If you would like to schedule transportation through your [facility], please call the following number at least 2 days in advance of your appointment: [phone]. ? Rides for urgent appointments can also be made after hours by calling Member Services. ? ?Call the Behavioral Health Crisis Line at [phone], at any time, 24 hours a day, 7 days a week. If you are in danger or need immediate medical attention call 911. ? ?If you would like help to quit smoking, call 1-800-QUIT-NOW (2132064223) OR Espa?ol: 1-855-D?jelo-Ya [phone]) o para m?s informaci?n haga clic aqu? or Text READY to 200-400 to register via text ? ?[name] - following are the goals we discussed in your visit today:  ? Goals Addressed   ?None ?  ? ? ?Social Worker will follow up in 10 days .  ? ?[name], BSW, [place] ?[employer] [facility]  ?High Risk Managed Medicaid Team  ?[phone]  ? ?Following is a copy of your plan of care:  ?There are no care plans that you recently modified to display for this patient. ?  ?
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
## 2021-05-14 ENCOUNTER — Ambulatory Visit: Payer: Self-pay

## 2021-05-19 ENCOUNTER — Ambulatory Visit: Payer: Self-pay

## 2021-05-19 ENCOUNTER — Other Ambulatory Visit: Payer: Self-pay | Admitting: Licensed Clinical Social Worker

## 2021-05-19 NOTE — Patient Instructions (Signed)
Visit Information ? ?Mr. Dimattia was given information about Medicaid Managed Care team care coordination services as a part of their [facility] Community Plan Medicaid benefit. [name] Mariott Hickle verbally consented to engagement with the [facility] Managed Care team.  ? ?If you are experiencing a medical emergency, please call 911 or report to your local emergency department or urgent care.  ? ?If you have a non-emergency medical problem during routine business hours, please contact your provider's office and ask to speak with a nurse.  ? ?For questions related to your [facility], please call: [phone] or visit the homepage here: [email or web address] ? ?If you would like to schedule transportation through your [facility], please call the following number at least 2 days in advance of your appointment: [phone]. ? Rides for urgent appointments can also be made after hours by calling Member Services. ? ?Call the Behavioral Health Crisis Line at [phone], at any time, 24 hours a day, 7 days a week. If you are in danger or need immediate medical attention call 911. ? ?If you would like help to quit smoking, call 1-800-QUIT-NOW ((808) 725-6011) OR Espa?ol: 1-855-D?jelo-Ya 410-411-4607) o para m?s informaci?n haga clic aqu? or Text READY to 200-400 to register via text ? ? ?Following is a copy of your plan of care:  ?Care Plan : LCSW Plan of Care  ?Updates made by [name], LCSW since 05/19/2021 12:00 AM  ?  ? ?Problem: I need support now more than ever   ?Priority: High  ?Onset Date: 05/19/2021  ?Note:   ?Priority: High ? ?Timeframe:  Long-Range Goal ?Priority:  High ?Start Date:   05/19/21                ?Expected End Date:  ongoing                   ?  ?Follow Up Date--07/14/21 ? ? Current barriers:   ?Chronic Mental Health needs related to TBI, Chronic Pain, depression, stress and anxiety. Mental  Health Needs requires Support, Education, Resources, Referrals, Advocacy, and Care Coordination, in order to meet Unmet Mental Health Needs. ?Social Isolation ?Patient lacks knowledge of available community counseling agencies and resources. ? ?Clinical Goal(s): verbalize understanding of plan for management of Anxiety, Depression, and Stress and demonstrate a reduction in symptoms related to : Depression. Patient will increase/improve coping skills, healthy habits, pain management, self-management skills, and stress reduction     ?   ?Patient Goals/Self-Care Activities: Over the next 120 days ?Attend scheduled medical appointments ?Utilize healthy coping skills and supportive resources discussed ?Contact PCP with any questions or concerns ?Keep 90 percent of counseling appointments ?Call your insurance provider for more information about your Enhanced Benefits  ?Consider counseling ?Incorporate into daily practice - relaxation techniques, deep breathing exercises, and mindfulness meditation strategies. ?Talk about feelings with someone you trust ?Contact LCSW directly (#[phone]), if you have questions, need assistance, or if additional social work needs are identified between now and our next scheduled telephone outreach call. ?Call 988 for mental health hotline/crisis line if needed (24/7 available) ?Try techniques to reduce symptoms of anxiety/negative thinking (deep breathing, distraction, positive self talk, etc)  ?- develop a personal safety plan ?- develop a plan to deal with triggers like holidays, anniversaries ?- exercise at least 2 to 3 times per week ?- have a plan for how to handle bad days ?- journal feelings and what helps to feel better or worse ?- spend  time or talk with others at least 2 to 3 times per week ?- watch for early signs of feeling worse ?- call and visit an old friend ?- check out volunteer opportunities ?- join a support group ?- laugh; watch a funny movie or comedian ?- learn and  use visualization or guided imagery ?- perform a random act of kindness ?- practice relaxation or meditation daily ?- start or continue a personal journal ?- practice positive thinking and self-talk ?-continue with compliance of taking medication  ? ? ?Dickie La, BSW, MSW, LCSW ?Managed Medicaid LCSW ?[place]  [facility] ?[email or web address] ?Phone: [phone] ? ?  ?  ?
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
## 2021-05-21 ENCOUNTER — Other Ambulatory Visit: Payer: Self-pay
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
## 2021-05-21 NOTE — Patient Instructions (Signed)
Visit Information ? ?Mr. Stejskal was given information about Medicaid Managed Care team care coordination services as a part of their [facility] Community Plan Medicaid benefit. [name] Mariott Beyene verbally consented to engagement with the [facility] Managed Care team.  ? ?If you are experiencing a medical emergency, please call 911 or report to your local emergency department or urgent care.  ? ?If you have a non-emergency medical problem during routine business hours, please contact your provider's office and ask to speak with a nurse.  ? ?For questions related to your [facility], please call: [phone] or visit the homepage here: [email or web address] ? ?If you would like to schedule transportation through your [facility], please call the following number at least 2 days in advance of your appointment: [phone]. ? Rides for urgent appointments can also be made after hours by calling Member Services. ? ?Call the Behavioral Health Crisis Line at [phone], at any time, 24 hours a day, 7 days a week. If you are in danger or need immediate medical attention call 911. ? ?If you would like help to quit smoking, call 1-800-QUIT-NOW ((281) 133-8471) OR Espa?ol: 1-855-D?jelo-Ya 413-614-2610) o para m?s informaci?n haga clic aqu? or Text READY to 200-400 to register via text ? ?Mr. Garfinkel - following are the goals we discussed in your visit today: Please see Patient Goals in RN Care Manager Plan of Care below. ? ?Please see education materials related to today's visit provided by MyChart link. ? ?Patient verbalizes understanding of instructions and care plan provided today and agrees to view in MyChart. Active MyChart status confirmed with patient.   ? ?The Managed Medicaid care management team will reach out to the patient again over the next 21 days.  ? ?[name] RN, [name] ?Community Care  Coordinator ?[place]  [facility] ?Mobile: [phone]  ? ?Following is a copy of your plan of care:  ?Care Plan : RN Care Manager Plan of Care  ?Updates made by Leane Call, RN since 05/21/2021 12:00 AM  ?  ? ?Problem: Chronic Disease Management and Care Coordination Needs for TBI & Pain Management   ?Priority: High  ?  ? ?Long-Range Goal: Development of Plan of Care for Chronic Disease Management and Care Coordination Needs (TBI, Pain Management)   ?Start Date: 05/21/2021  ?Expected End Date: 09/18/2021  ?Priority: High  ?Note:   ?Current Barriers:  ?Knowledge Deficits related to plan of care for management of Chronic Pain and Traumatic Brain Injury (TBI)  ?Care Coordination needs related to Financial constraints related to securing food, Limited social support, Limited access to food, Level of care concerns, Medication procurement, ADL IADL limitations, Mental Health Concerns , Family and relationship dysfunction, Social Isolation, Literacy concerns, Limited access to caregiver, Cognitive Deficits, Memory Deficits, Inability to perform ADL's independently, Inability to perform IADL's independently, and Lacks knowledge of community resource: PCS services  ?Chronic Disease Management support and education needs related to Chronic Pain and TBI ?Lacks caregiver support.        ?Corporate treasurer.  ?Literacy barriers ?Non-adherence to prescribed medication regimen ?Difficulty obtaining medications ?Cognitive Deficits ?Falls ? ?RNCM Clinical Goal(s):  ?Patient will verbalize understanding of plan for management of TBI and Chronic Pain as evidenced by improved management of TBI & Chronic Pain ?verbalize basic understanding of TBI and Chronic Pain disease process and self health management plan as evidenced by noted improvement in management of TBI and Chronic Pain ?take all medications exactly as prescribed and  will call provider for medication related questions as evidenced by being compliant  with all medications    ?attend all scheduled medical [facility]: [facility] 06/18/21 Gwinda PasseMichelle Edwards - New patient visit as evidenced by attending all scheduled appointments        ?demonstrate improved adherence to prescribed treatment plan for TBI and Chronic Pain Management as evidenced by improved management of TBI and Chronic Pain ?continue to work with RN Care Manager and/or Social Worker to address care management and care coordination needs related to TBI and Chronic Pain as evidenced by adherence to CM Team Scheduled appointments     ?work with Child psychotherapistsocial worker to address Financial constraints related to securing food, Limited social support, Limited access to food, Level of care concerns, Medication procurement, ADL IADL limitations, Mental Health Concerns , Family and relationship dysfunction, Social Isolation, Literacy concerns, Limited access to caregiver, Cognitive Deficits, Memory Deficits, Inability to perform ADL's independently, Inability to perform IADL's independently, and Lacks knowledge of community resource: PCS services related to the management of TBI & Chronic Pain as evidenced by review of EMR and patient or Child psychotherapistsocial worker report     ?work with Johnson & JohnsonLCSW and BSW to provide support and community resources as evidenced by on-going communication with Johnson & JohnsonLCSW and BSW  through collaboration with Medical illustratorN Care manager, provider, and care team.  ? ?Interventions: ?Inter-disciplinary care team collaboration (see longitudinal plan of care) ?Evaluation of current treatment plan related to  self management and patient's adherence to plan as established by provider ? ? ?Traumatic Brain Injury (TBI)  (Status:  New goal.)  Long Term Goal ?Evaluation of current treatment plan related to  TBI , Limited social support, ADL IADL limitations, Mental Health Concerns , Family and relationship dysfunction, Social Isolation, Literacy concerns, Limited access to caregiver, Cognitive Deficits, Memory Deficits, Inability to  perform ADL's independently, Inability to perform IADL's independently, and Lacks knowledge of community resource: PCS services,  self-management and patient's adherence to plan as established by provider. ?Discussed plans with patient for ongoing care management follow up and provided patient with direct contact information for care management team ?Evaluation of current treatment plan related to management of TBI and patient's adherence to plan as established by provider ?Reviewed medications with patient and discussed importance of medication compliance ?Collaborated with PCP office regarding scheduling an office visit ASAP to address pain management. ?Reviewed scheduled/upcoming provider appointments including ?Discussed plans with patient for ongoing care management follow up and provided patient with direct contact information for care management team ?Advised patient to discuss pain medication regimen with provider ?Screening for signs and symptoms of depression related to chronic disease state  ?Assessed social determinant of health barriers  ? ? ? ?Pain:  (Status: New goal.) Long Term Goal  ?Pain assessment performed.  Patient yells that the pain "hurts all over".  After patient calmed down he was able to report that his chronic pain is in his shoulders, arms and knees.   ?Medications reviewed.  Currently patient is taking no pain medications because he says that no provider will work with him to get him pain medication.  He states that Oxycodone 7.5 mg TID works most effectively in the past. ?Reviewed provider established plan for pain management; ?Discussed importance of adherence to all scheduled medical appointments; ?Counseled on the importance of reporting any/all new or changed pain symptoms or management strategies to pain management provider; ?Advised patient to report to care team affect of pain on daily activities; ?Reviewed with patient prescribed pharmacological  and nonpharmacological pain  relief strategies; ?Screening for signs and symptoms of depression related to chronic disease state;  ?Assessed social determinant of health barriers;  ?This RN Care Manager contacted patient's PCP office and spoke

## 2021-05-26 ENCOUNTER — Ambulatory Visit (INDEPENDENT_AMBULATORY_CARE_PROVIDER_SITE_OTHER): Payer: Medicaid Other | Admitting: Nurse Practitioner

## 2021-05-26 ENCOUNTER — Encounter: Payer: Self-pay | Admitting: Nurse Practitioner

## 2021-05-26 VITALS — BP 155/111 | HR 62 | Temp 99.7°F | Ht 65.0 in | Wt 126.4 lb

## 2021-05-26 DIAGNOSIS — M79602 Pain in left arm: Secondary | ICD-10-CM | POA: Diagnosis not present
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
## 2021-05-26 NOTE — Patient Instructions (Signed)
You were seen today in the [facility] for pain management. Please follow up in 6 mths for reevaluation.  ?

## 2021-05-27 ENCOUNTER — Other Ambulatory Visit: Payer: Self-pay

## 2021-06-02 ENCOUNTER — Other Ambulatory Visit: Payer: Self-pay

## 2021-06-02 NOTE — Patient Instructions (Signed)
Visit Information ? ?Mr. [facility]  - as a part of your Medicaid benefit, you are eligible for care management and care coordination services at no cost or copay. I was unable to reach you by phone today but would be happy to help you with your health related needs. Please feel free to call me [phone] ?A member of the Managed Medicaid care management team will reach out to you again over the next 7 days.  ? ?[phone], BSW, MHA ?[name]  ?High Risk Managed Medicaid Team  ?[phone]  ?

## 2021-06-02 NOTE — Patient Outreach (Signed)
Care Coordination ? ?06/02/2021 ? ?[name] ?08-25-1980 ?[id number] ? ? ?Medicaid Managed Care  ? ?Unsuccessful Outreach Note ? ?06/02/2021 ?Name: Bruce [name] MRN: [id number] DOB: 18-May-1980 ? ?Referred by: Kathrynn Speed, NP ?Reason for referral : High Risk Managed Medicaid (MM Social work Unsuccessful telephone outreach) ? ? ?An unsuccessful telephone outreach was attempted today. The patient was referred to the case management team for assistance with care management and care coordination.  ? ?Follow Up Plan: The care management team will reach out to the patient again over the next 7 days.  ? ?[name], BSW, MHA ?[facility]  ?High Risk Managed Medicaid Team  ?[phone]  ?

## 2021-06-03 ENCOUNTER — Other Ambulatory Visit: Payer: Self-pay
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
## 2021-06-03 NOTE — Patient Instructions (Signed)
Visit Information ? ?[name] was given information about Medicaid Managed Care team care coordination services as a part of their [facility] Community Plan Medicaid benefit. Bruce [name] verbally consented to engagement with the [facility] Managed Care team.  ? ?If you are experiencing a medical emergency, please [name] 911 or report to your local emergency department or urgent care.  ? ?If you have a non-emergency medical problem during routine business hours, please contact your provider's office and ask to speak with a nurse.  ? ?For questions related to your [facility], please [name]: [phone] or visit the homepage here: [email or web address] ? ?If you would like to schedule transportation through your [facility], please [name] the following number at least 2 days in advance of your appointment: [phone]. ? Rides for urgent appointments can also be made after hours by calling Member Services. ? ?[name] the Behavioral Health Crisis Line at [phone], at any time, 24 hours a day, 7 days a week. If you are in danger or need immediate medical attention [name] 911. ? ?If you would like help to quit smoking, [name] 1-800-QUIT-NOW (231-198-0327) OR Espa?ol: 1-855-D?jelo-Ya [phone]) o para m?s informaci?n haga clic aqu? or Text READY to 200-400 to register via text ? ?Bruce [name] - following are the goals we discussed in your visit today:  Please see Patient Goals in the RN Care Manager Plan of Care below. ? ?Please see education materials related to today's visit provided by MyChart link. ? ?Patient verbalizes understanding of instructions and care plan provided today and agrees to view in MyChart. Active MyChart status confirmed with patient.   ? ?The Managed Medicaid care management team will reach out to the patient again over the next 30 days.  ? ?Bruce Norfolk RN, [name] ?Community Care  Coordinator ?[place]  [facility] ?Mobile: [phone]  ? ?Following is a copy of your plan of care:  ?Care Plan : RN Care Manager Plan of Care  ?Updates made by Bruce Call, RN since 06/03/2021 12:00 AM  ?  ? ?Problem: Chronic Disease Management and Care Coordination Needs for TBI & Pain Management   ?Priority: High  ?  ? ?Long-Range Goal: Development of Plan of Care for Chronic Disease Management and Care Coordination Needs (TBI, Pain Management)   ?Start Date: 05/21/2021  ?Expected End Date: 09/18/2021  ?Priority: High  ?Note:   ?Current Barriers:  ?Knowledge Deficits related to plan of care for management of Chronic Pain and Traumatic Brain Injury (TBI)  ?Care Coordination needs related to Financial constraints related to securing food, Limited social support, Limited access to food, Level of care concerns, Medication procurement, ADL IADL limitations, Mental Health Concerns , Family and relationship dysfunction, Social Isolation, Literacy concerns, Limited access to caregiver, Cognitive Deficits, Memory Deficits, Inability to perform ADL's independently, Inability to perform IADL's independently, and Lacks knowledge of community resource: PCS services  ?Chronic Disease Management support and education needs related to Chronic Pain and TBI ?Lacks caregiver support.        ?Corporate treasurer.  ?Literacy barriers ?Non-adherence to prescribed medication regimen ?Difficulty obtaining medications ?Cognitive Deficits ?Falls ? ?RNCM Clinical Goal(s):  ?Patient will verbalize understanding of plan for management of TBI and Chronic Pain as evidenced by improved management of TBI & Chronic Pain ?verbalize basic understanding of TBI and Chronic Pain disease process and self health management plan as evidenced by noted improvement in management of TBI and Chronic Pain ?take all medications exactly as  prescribed and will [name] provider for medication related questions as evidenced by being compliant  with all medications    ?attend all scheduled [facility]; 06/18/21 Bruce PasseMichelle [name] - New patient visit as evidenced by attending all scheduled appointments        ?demonstrate improved adherence to prescribed treatment plan for TBI and Chronic Pain Management as evidenced by improved management of TBI and Chronic Pain ?continue to work with RN Care Manager and/or Social Worker to address care management and care coordination needs related to TBI and Chronic Pain as evidenced by adherence to CM Team Scheduled appointments     ?work with Child psychotherapistsocial worker to address Financial constraints related to securing food, Limited social support, Limited access to food, Level of care concerns, Medication procurement, ADL IADL limitations, Mental Health Concerns , Family and relationship dysfunction, Social Isolation, Literacy concerns, Limited access to caregiver, Cognitive Deficits, Memory Deficits, Inability to perform ADL's independently, Inability to perform IADL's independently, and Lacks knowledge of community resource: PCS services related to the management of TBI & Chronic Pain as evidenced by review of EMR and patient or Child psychotherapistsocial worker report     ?work with Johnson & JohnsonLCSW and BSW to provide support and community resources as evidenced by on-going communication with Johnson & JohnsonLCSW and BSW  through collaboration with Medical illustratorN Care manager, provider, and care team.  ? ?Interventions: ?Inter-disciplinary care team collaboration (see longitudinal plan of care) ?Evaluation of current treatment plan related to  self management and patient's adherence to plan as established by provider ? ? ?Traumatic Brain Injury (TBI)  (Status:  Goal on track:  NO.)  Long Term Goal ?Evaluation of current treatment plan related to  TBI , Limited social support, ADL IADL limitations, Mental Health Concerns , Family and relationship dysfunction, Social Isolation, Literacy concerns, Limited access to caregiver, Cognitive Deficits, Memory Deficits,  Inability to perform ADL's independently, Inability to perform IADL's independently, and Lacks knowledge of community resource: PCS services,  self-management and patient's adherence to plan as established by provider. ?Discussed plans with patient for ongoing care management follow up and provided patient with direct contact information for care management team ?Evaluation of current treatment plan related to management of TBI and patient's adherence to plan as established by provider ?Reviewed medications with patient and discussed importance of medication compliance ?Reviewed scheduled/upcoming provider appointments including ?Discussed plans with patient for ongoing care management follow up and provided patient with direct contact information for care management team  ?Patient very agitated, yelling throughout the telephone conversation, jumping from one subject to another and hard to understand due to patient not being clear in what he is saying at times. ? ? ?Pain:  (Status: Goal on track: NO.) Long Term Goal  ?Pain assessment performed.  Patient yelling that he is in pain.  Patient was able to state that his chronic pain is mainly in both arms.     ?Medications reviewed.  Currently patient is NOT taking any pain medications, even OTC ibuprofen.  Patient wants Oxycodone 7.5 mg TID since that is what has worked most effectively in the past.  Patient had office visit with PCP on 05/26/2021 and PCP's pain medication plan is for patient to maintain on Gabapentin, Ibuprofen, Methocarbamol (Robaxin) and Oxycodone/acetaminophen (Percocet).  Patient refuses to take any of these medications because he believes they do not work. Patient is drinking alcohol to help relieve his chronic pain.  He feels this is the thing he can do to relieve the pain. ?Reviewed provider established  plan for pain management; ?Discussed importance of adherence to all scheduled medical appointments; ?Counseled on the importance of reporting  any/all new or changed pain symptoms or management strategies to pain management provider; ?Advised patient to report to care team affect of pain on daily activities; ?Reviewed with patient prescribed pharmaco

## 2021-06-11 ENCOUNTER — Other Ambulatory Visit: Payer: Self-pay

## 2021-06-11 ENCOUNTER — Ambulatory Visit: Payer: Medicaid Other

## 2021-06-11 NOTE — Patient Instructions (Signed)
Visit Information ? ?Bruce [name] was given information about Medicaid Managed Care team care coordination services as a part of their [facility] Community Plan Medicaid benefit. Bruce [name] verbally consented to engagement with the [facility] Managed Care team.  ? ?If you are experiencing a medical emergency, please call 911 or report to your local emergency department or urgent care.  ? ?If you have a non-emergency medical problem during routine business hours, please contact your provider's office and ask to speak with a nurse.  ? ?For questions related to your [facility], please call: [phone] or visit the homepage here: [email or web address] ? ?If you would like to schedule transportation through your [facility], please call the following number at least 2 days in advance of your appointment: [phone]. ? Rides for urgent appointments can also be made after hours by calling Member Services. ? ?Call the Behavioral Health Crisis Line at [phone], at any time, 24 hours a day, 7 days a week. If you are in danger or need immediate medical attention call 911. ? ?If you would like help to quit smoking, call 1-800-QUIT-NOW (512-571-2910) OR Espa?ol: 1-855-D?jelo-Ya 581-065-9921) o para m?s informaci?n haga clic aqu? or Text READY to 200-400 to register via text ? ?Bruce [name] - following are the goals we discussed in your visit today:  ? Goals Addressed   ?None ?  ? ? ? ?Social Worker will follow up in 14 days .  ? ?Bruce [name], BSW, [place] ?[facility]  ?High Risk Managed Medicaid Team  ?[phone]) [phone]  ? ?Following is a copy of your plan of care:  ?There are no care plans that you recently modified to display for this patient. ?  ?
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
## 2021-06-17 ENCOUNTER — Telehealth (INDEPENDENT_AMBULATORY_CARE_PROVIDER_SITE_OTHER): Payer: Self-pay | Admitting: Primary Care

## 2021-06-17 ENCOUNTER — Other Ambulatory Visit: Payer: Medicaid Other | Admitting: Licensed Clinical Social Worker
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
## 2021-06-17 NOTE — Patient Instructions (Signed)
Visit Information ? ?If you are experiencing a medical emergency, please call 911 or report to your local emergency department or urgent care.  ? ?If you have a non-emergency medical problem during routine business hours, please contact your provider's office and ask to speak with a nurse.  ? ?For questions related to your [facility], please call: [phone] or visit the homepage here: [email or web address] ? ?If you would like to schedule transportation through your [facility], please call the following number at least 2 days in advance of your appointment: [phone]. ? Rides for urgent appointments can also be made after hours by calling Member Services. ? ?Call the [facility] at [phone], at any time, 24 hours a day, 7 days a week. If you are in danger or need immediate medical attention call 911. ? ?If you would like help to quit smoking, call 1-800-QUIT-NOW 6460602115) OR Espa?ol: 1-855-D?jelo-Ya (206) 798-7385) o para m?s informaci?n haga clic aqu? or Text READY to 200-400 to register via text ?Eula Fried, BSW, MSW, LCSW ?Managed Medicaid LCSW ?[facility] ?[email or web address] ?Phone: [phone] ? ?  ?
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
## 2021-06-18 ENCOUNTER — Telehealth: Payer: Self-pay

## 2021-06-18 ENCOUNTER — Ambulatory Visit (INDEPENDENT_AMBULATORY_CARE_PROVIDER_SITE_OTHER): Payer: Medicaid Other | Admitting: Primary Care

## 2021-06-18 NOTE — Patient Outreach (Signed)
Care Coordination ? ?06/18/2021 ? ?Bruce [name] ?25-Dec-1980 ?[id number] ? ?BSW received a voicemail from patient on 06/17/21. BSW returned patients telephone call but was unable to leave a voicemail due to mailbox being full. ? ? ?[name], BSW, [place] ?[facility]  ?High Risk Managed Medicaid Team  ?[phone]  ?

## 2021-06-18 NOTE — Patient Instructions (Signed)
Visit Information ? ?Mr. [facility]  - as a part of your Medicaid benefit, you are eligible for care management and care coordination services at no cost or copay. I was unable to reach you by phone today but would be happy to help you with your health related needs. Please feel free to call me @ [phone] ? ? ?Mickel Fuchs, BSW, [place] ?[place]  ?High Risk Managed Medicaid Team  ?[phone]  ?

## 2021-07-01 ENCOUNTER — Other Ambulatory Visit: Payer: Self-pay
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
## 2021-07-01 NOTE — Patient Instructions (Signed)
Visit Information ? ?[name] was given information about Medicaid Managed Care team care coordination services as a part [facility]. [name] Mariott Bruce [name] verbally consented to engagement with the [facility].  ? ?If you are experiencing a medical emergency, please call 911 or report to your local emergency department or urgent care.  ? ?If you have a non-emergency medical problem during routine business hours, please contact your provider's office and ask to speak with a nurse.  ? ?For questions related to your [facility] Medicaid, please call: [phone] or visit the homepage here: [email or web address] ? ?If you would like to schedule transportation through your [facility] Medicaid, please call the following number at least 2 days in advance of your appointment: [phone]. ? Rides for urgent appointments can also be made after hours by calling Member Services. ? ?Call the Behavioral Health Crisis Line at [phone], at any time, 24 hours a day, 7 days a week. If you are in danger or need immediate medical attention call 911. ? ?If you would like help to quit smoking, call 1-800-QUIT-NOW ([phone] 83881-947-336-3460) OR Espa?ol: 1-855-D?jelo-Ya (680)738-6496(1-8191885692) o para m?s informaci?n haga clic aqu? or Text READY to 200-400 to register via text ? ?[name] - following are the goals we discussed in your visit today: Please see Patient Goals in the RN Care Manager Plan of Care below. ? ?Please see education materials related to today's visit provided by MyChart link. ? ?Patient verbalizes understanding of instructions and care plan provided today and agrees to view in MyChart. Active MyChart status confirmed with patient.   ? ?The Managed Medicaid care management team will reach out to the patient again over the next 15 days.  ? ?Virgina NorfolkMaureen Mihailo Sage RN, [name] ?Community Care  Coordinator ?[place]  [facility] ?Mobile: [phone]  ? ?Following is a copy of your plan of care:  ?Care Plan : RN Care Manager Plan of Care  ?Updates made by Leane Callavanaugh, Nakeesha Bowler A, RN since 07/01/2021 12:00 AM  ?  ? ?Problem: Chronic Disease Management and Care Coordination Needs for TBI & Pain Management   ?Priority: High  ?  ? ?Long-Range Goal: Development of Plan of Care for Chronic Disease Management and Care Coordination Needs (TBI, Pain Management)   ?Start Date: 05/21/2021  ?Expected End Date: 09/18/2021  ?Priority: High  ?Note:   ?Current Barriers:  ?Knowledge Deficits related to plan of care for management of Chronic Pain and Traumatic Brain Injury (TBI)  ?Care Coordination needs related to Financial constraints related to securing food, Limited social support, Limited access to food, Level of care concerns, Medication procurement, ADL IADL limitations, Mental Health Concerns , Family and relationship dysfunction, Social Isolation, Literacy concerns, Limited access to caregiver, Cognitive Deficits, Memory Deficits, Inability to perform ADL's independently, Inability to perform IADL's independently, and Lacks knowledge of community resource: PCS services  ?Chronic Disease Management support and education needs related to Chronic Pain and TBI ?Lacks caregiver support.        ?Corporate treasurerinancial Constraints.  ?Literacy barriers ?Non-adherence to prescribed medication regimen ?Difficulty obtaining medications ?Cognitive Deficits ?Falls ? ?RNCM Clinical Goal(s):  ?Patient will verbalize understanding of plan for management of TBI and Chronic Pain as evidenced by improved management of TBI & Chronic Pain ?verbalize basic understanding of TBI and Chronic Pain disease process and self health management plan as evidenced by noted improvement in management of TBI and Chronic Pain ?take all medications exactly as prescribed  and will call provider for medication related questions as evidenced by being compliant  with all medications    ?attend all scheduled medical appointments: 07/02/2021 Dr Ewing Schlein MD - Pain Medicine Specialist; 07/15/21 [facility] LCSW; 09/09/21 Gwinda Passe - New patient visit as evidenced by attending all scheduled appointments        ?demonstrate improved adherence to prescribed treatment plan for TBI and Chronic Pain Management as evidenced by improved management of TBI and Chronic Pain ?continue to work with RN Care Manager and/or Social Worker to address care management and care coordination needs related to TBI and Chronic Pain as evidenced by adherence to CM Team Scheduled appointments     ?work with Child psychotherapist to address Financial constraints related to securing food, Limited social support, Limited access to food, Level of care concerns, Medication procurement, ADL IADL limitations, Mental Health Concerns , Family and relationship dysfunction, Social Isolation, Literacy concerns, Limited access to caregiver, Cognitive Deficits, Memory Deficits, Inability to perform ADL's independently, Inability to perform IADL's independently, and Lacks knowledge of community resource: PCS services related to the management of TBI & Chronic Pain as evidenced by review of EMR and patient or Child psychotherapist report     ?work with [employer] and BSW to provide support and community resources as evidenced by on-going communication with [employer] and BSW  through collaboration with Medical illustrator, provider, and care team.  ? ?Interventions: ?Inter-disciplinary care team collaboration (see longitudinal plan of care) ?Evaluation of current treatment plan related to  self management and patient's adherence to plan as established by provider ? ? ?Traumatic Brain Injury (TBI)  (Status:  Goal on track:  NO.)  Long Term Goal ?Evaluation of current treatment plan related to  TBI , Limited social support, ADL IADL limitations, Mental Health Concerns , Family and relationship dysfunction, Social Isolation, Literacy concerns, Limited  access to caregiver, Cognitive Deficits, Memory Deficits, Inability to perform ADL's independently, Inability to perform IADL's independently, and Lacks knowledge of community resource: PCS services,  self-management and patient's adherence to plan as established by provider. ?Discussed plans with patient for ongoing care management follow up and provided patient with direct contact information for care management team ?Evaluation of current treatment plan related to management of TBI and patient's adherence to plan as established by provider ?Reviewed medications with patient and discussed importance of medication compliance ?Reviewed scheduled/upcoming provider appointments including ?Discussed plans with patient for ongoing care management follow up and provided patient with direct contact information for care management team  ?Patient much more calm and coherent on today's visit.  Easy to converse with.  ?Patient reports that PCS home care aide services started yesterday.  Patient states he will have PCS services everyday for 2 1/2 hours each day.  ?Patient would like follow up from [facility] BSW regarding financial assistance with utilities.   Plan to send message to BSW regarding this request. ? ? ?Pain:  (Status: Goal on track: NO.) Long Term Goal  ?Pain assessment performed.  Patient continues to report constant pain in his arms bilaterally.  Patient rates his pain as a "10" in a pain scale of 0 - 10. Patient is not yelling today as on previous telephone visits.     ?Medications reviewed.  Currently patient is NOT taking any pain medications, even OTC ibuprofen. ?Patient has scheduled appointment with Dr. Ewing Schlein MD - Pain Medicine Specialist tomorrow for first time visit.  Patient hopes to receive prescription for some type of pain medication at tomorrow's  visit. ?Reviewed provider established plan for pain management; ?Discussed importance of adherence to all scheduled medical appointments; ?Counseled on  the importance of reporting any/all new or changed pain symptoms or management strategies to pain management provider; ?Advised patient to report to care team affect of pain on daily activities; ?Reviewed wi

## 2021-07-07 ENCOUNTER — Other Ambulatory Visit: Payer: Self-pay
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
## 2021-07-07 NOTE — Patient Instructions (Signed)
Visit Information  Mr. Vivier was given information about Medicaid Managed Care team care coordination services as a part of their [facility] Community Plan Medicaid benefit. Shahram Mariott Lampert verbally consented to engagement with the [facility] Managed Care team.   If you are experiencing a medical emergency, please call 911 or report to your local emergency department or urgent care.   If you have a non-emergency medical problem during routine business hours, please contact your provider's office and ask to speak with a nurse.   For questions related to your [facility], please call: [phone] or visit the homepage here: [email or web address]  If you would like to schedule transportation through your [facility], please call the following number at least 2 days in advance of your appointment: [phone].  Rides for urgent appointments can also be made after hours by calling Member Services.  Call the Behavioral Health Crisis Line at [phone], at any time, 24 hours a day, 7 days a week. If you are in danger or need immediate medical attention call 911.  If you would like help to quit smoking, call 1-800-QUIT-NOW (804-014-4218) OR Espaol: 1-855-Djelo-Ya (0-347-425-9563) o para ms informacin haga clic aqu or Text READY to 875-643 to register via text  Mr. Preis - following are the goals we discussed in your visit today:   Goals Addressed   None     Social Worker will follow up with patient in 30 days .   [name], BSW, [facility]  [place]  High Risk Managed Medicaid Team  [phone]   Following is a copy of your plan of care:  Care Plan : LCSW Plan of Care  Updates made by Shaune Leeks since 07/07/2021 12:00 AM     Problem: I need support now more than ever   Priority: High  Onset Date: 05/19/2021  Note:   Priority:  High  Timeframe:  [name]-Range Goal Priority:  High Start Date:   05/19/21                Expected End Date:  ongoing                     Follow Up Date--07/14/21   Current barriers:   Chronic Mental Health needs related to TBI, Chronic Pain, depression, stress and anxiety. Mental Health Needs requires Support, Education, Resources, Referrals, Advocacy, and Care Coordination, in order to meet Unmet Mental Health Needs. Social Isolation Patient lacks knowledge of available community counseling agencies and resources.  Clinical Goal(s): verbalize understanding of plan for management of Anxiety, Depression, and Stress and demonstrate a reduction in symptoms related to : Depression. Patient will increase/improve coping skills, healthy habits, pain management, self-management skills, and stress reduction        Clinical Interventions:  Assessed patient's previous and current treatment, coping skills, support system and barriers to care. Pt has a very limited social support Stage manager of feelings encouraged, motivational interviewing employed Emotional support provided, positive coping strategies explored Patient reports that his main concern is his physical health not his mental health. [facility] LCSW educated patient on how these work together. Patient denies the need for mental health resource connection at this time but is agreeable to [facility] RNCM and [facility] BSW support. Patient is out of his pain medication and needs care coordination assistance. Patient has been trying to gain SCAT for years without any success and reports that being able to go  out (other than medical appointments) will improve his depression. He states that sine he lives alone that he is in need of care assistance. He is interested in gaining PCS through Medicaid as he has issue with carrying out both IADL's and ADL'S.  Patient reports that his unmanaged depression has caused him to start drinking again. Relapse prevention education  provided.  Patient denies SI/HI but reports that life "would be easier" if he was not around. He states NO active plans to harm himself. He states that he would never do this. He was encouraged to contact the following resources listed below if he does experience active SI/HI. Patient was agreeable to this safety plan.    24- Hour Availability:    [facility]  [address] [place], [place] [phone] Crisis [phone]   [facility] [phone]   [place] [facility]  [phone]    [facility]  [phone] (after hours)   Therapeutic Alternative/Mobile Crisis   [phone]   [place] National Suicide Hotline  (669)837-1646 Len Childs) [place] 976   Call 911 or go to emergency room   [facility]  [phone]);  [place] and [employer]  [phone]); [place], [place], [place], [place], [place], [place], [place]        Patient reports significant worsening mental health due to his physical health and unmet needs. Referral made to [facility] RNCM. Appointment was scheduled for 05/21/21 at 1:00 pm.  Patient endorsed need for transportation for running errands. Referral placed for [facility] BSW on 05/19/21. Appointment was scheduled for 06/02/21 at 11:00 am.  LCSW provided education on relaxation techniques such as meditation, deep breathing, massage, grounding exercsies or yoga that can activate the body's relaxation response and ease symptoms of stress and anxiety. LCSW ask that when pt is struggling with difficult emotions and racing thoughts that they start this relaxation response process. LCSW provided extensive education on healthy coping skills for anxiety. SW used active and reflective listening, validated patient's feelings/concerns, and provided emotional support. Patient will work on implementing appropriate self-care habits into their daily routine such as: drinking water,  staying active around the house, taking their medications as prescribed, combating negative thoughts or emotions and staying connected with any positive support that is involved in his life. Positive reinforcement provided for this decision to work on this. Patient reports that he experiences both anxiety and depression and that this is contributed to his current lack of pain management. He shares that he feels alone most days as his parents are deceased and his children have ran away from home. He was receptive to anxiety and depression management coping skill education.  Motivational Interviewing employed Depression screen reviewed  PHQ2/ PHQ9 completed Mindfulness or Relaxation training provided Active listening / Reflection utilized  Advance Care and HCPOA education provided Emotional Support Provided Problem Solving /Task Center strategies reviewed Provided psychoeducation for mental health needs  Provided brief CBT  Reviewed mental health medications and discussed importance of compliance:  Quality of sleep assessed & Sleep Hygiene techniques promoted  Participation in counseling encouraged  Verbalization of feelings encouraged  Suicidal Ideation/Homicidal Ideation assessed: Patient denies SI/HI  Review resources, discussed options and provided patient information about  Mental Health Resources Crisis Support Resources Inter-disciplinary care team collaboration (see longitudinal plan of care) BSW completed telephone outreach with patient he stated he does not remember if BSW sent him utiitly resources. BSW will send utility resources to patient.  He stated that he does have an aide and she broke his mop.  Patient Goals/Self-Care Activities: Over the next 120 days Attend scheduled medical appointments Utilize healthy coping skills and supportive resources discussed Contact PCP with any questions or concerns Keep 90 percent of counseling appointments Call your insurance provider for  more information about your Enhanced Benefits  Consider counseling Incorporate into daily practice - relaxation techniques, deep breathing exercises, and mindfulness meditation strategies. Talk about feelings with someone you trust Contact LCSW directly [phone]), if you have questions, need assistance, or if additional social work needs are identified between now and our next scheduled telephone outreach call. Call 988 for mental health hotline/crisis line if needed (24/7 available) Try techniques to reduce symptoms of anxiety/negative thinking (deep breathing, distraction, positive self talk, etc)  - develop a personal safety plan - develop a plan to deal with triggers like holidays, anniversaries - exercise at least 2 to 3 times per week - have a plan for how to handle bad days - journal feelings and what helps to feel better or worse - spend time or talk with others at least 2 to 3 times per week - watch for early signs of feeling worse - call and visit an old friend - check out volunteer opportunities - join a support group - laugh; watch a funny movie or comedian - learn and use visualization or guided imagery - perform a random act of kindness - practice relaxation or meditation daily - start or continue a personal journal - practice positive thinking and self-talk -continue with compliance of taking medication

## 2021-07-15 ENCOUNTER — Other Ambulatory Visit: Payer: Self-pay | Admitting: Licensed Clinical Social Worker
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
## 2021-07-15 NOTE — Patient Instructions (Signed)
Visit Information  Mr. Huesman was given information about Medicaid Managed Care team care coordination services as a part of their [facility] Community Plan Medicaid benefit. Joon Mariott Holderby verbally consented to engagement with the [facility] Managed Care team.   If you are experiencing a medical emergency, please call 911 or report to your local emergency department or urgent care.   If you have a non-emergency medical problem during routine business hours, please contact your provider's office and ask to speak with a nurse.   For questions related to your [facility], please call: [phone] or visit the homepage here: [email or web address]  If you would like to schedule transportation through your [facility], please call the following number at least 2 days in advance of your appointment: [phone].  Rides for urgent appointments can also be made after hours by calling Member Services.  Call the Behavioral Health Crisis Line at [phone], at any time, 24 hours a day, 7 days a week. If you are in danger or need immediate medical attention call 911.  If you would like help to quit smoking, call 1-800-QUIT-NOW (774-789-3092) OR Espaol: 1-855-Djelo-Ya (6-384-536-4680) o para ms informacin haga clic aqu or Text READY to 321-224 to register via text  Following is a copy of your plan of care:  Care Plan : LCSW Plan of Care  Updates made by [name], LCSW since 07/15/2021 12:00 AM     Problem: I need support now more than ever   Priority: High  Onset Date: 05/19/2021  Note:   Priority: High  Timeframe:  Long-Range Goal Priority:  High Start Date:   05/19/21                Expected End Date:  07/15/21                   Goal completed on 07/15/21   Current barriers:   Chronic Mental Health needs related to TBI, Chronic Pain, depression, stress and anxiety. Mental  Health Needs requires Support, Education, Resources, Referrals, Advocacy, and Care Coordination, in order to meet Unmet Mental Health Needs. Social Isolation Patient lacks knowledge of available community counseling agencies and resources.  Clinical Goal(s): verbalize understanding of plan for management of Anxiety, Depression, and Stress and demonstrate a reduction in symptoms related to : Depression. Patient will increase/improve coping skills, healthy habits, pain management, self-management skills, and stress reduction        Patient Goals/Self-Care Activities: Over the next 120 days Attend scheduled medical appointments Utilize healthy coping skills and supportive resources discussed Contact PCP with any questions or concerns Keep 90 percent of counseling appointments Call your insurance provider for more information about your Enhanced Benefits  Consider counseling Incorporate into daily practice - relaxation techniques, deep breathing exercises, and mindfulness meditation strategies. Talk about feelings with someone you trust Contact LCSW directly [phone]), if you have questions, need assistance, or if additional social work needs are identified between now and our next scheduled telephone outreach call. Call 988 for mental health hotline/crisis line if needed (24/7 available) Try techniques to reduce symptoms of anxiety/negative thinking (deep breathing, distraction, positive self talk, etc)  - develop a personal safety plan - develop a plan to deal with triggers like holidays, anniversaries - exercise at least 2 to 3 times per week - have a plan for how to handle bad days - journal feelings and what helps to feel better or worse - spend time or  talk with others at least 2 to 3 times per week - watch for early signs of feeling worse - call and visit an old friend - check out volunteer opportunities - join a support group - laugh; watch a funny movie or comedian - learn and  use visualization or guided imagery - perform a random act of kindness - practice relaxation or meditation daily - start or continue a personal journal - practice positive thinking and self-talk -continue with compliance of taking medication   Goal completed.

## 2021-07-16 ENCOUNTER — Other Ambulatory Visit: Payer: Self-pay
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
## 2021-07-23 ENCOUNTER — Other Ambulatory Visit: Payer: Self-pay
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
## 2021-07-30 ENCOUNTER — Institutional Professional Consult (permissible substitution): Payer: Self-pay | Admitting: Surgery

## 2021-07-31 ENCOUNTER — Ambulatory Visit: Payer: Medicaid Other | Admitting: Family Medicine

## 2021-07-31 ENCOUNTER — Other Ambulatory Visit: Payer: Self-pay

## 2021-07-31 ENCOUNTER — Encounter: Payer: Self-pay | Admitting: Surgery

## 2021-07-31 ENCOUNTER — Encounter: Payer: Self-pay | Admitting: Gastroenterology

## 2021-07-31 ENCOUNTER — Ambulatory Visit: Payer: BLUE CROSS/BLUE SHIELD | Attending: Surgery | Admitting: Surgery

## 2021-07-31 DIAGNOSIS — Z6841 Body Mass Index (BMI) 40.0 and over, adult: Secondary | ICD-10-CM

## 2021-07-31 DIAGNOSIS — E785 Hyperlipidemia, unspecified: Secondary | ICD-10-CM | POA: Insufficient documentation

## 2021-07-31 DIAGNOSIS — I10 Essential (primary) hypertension: Secondary | ICD-10-CM | POA: Insufficient documentation

## 2021-07-31 DIAGNOSIS — G4730 Sleep apnea, unspecified: Secondary | ICD-10-CM | POA: Insufficient documentation

## 2021-07-31 DIAGNOSIS — E119 Type 2 diabetes mellitus without complications: Secondary | ICD-10-CM | POA: Insufficient documentation

## 2021-07-31 LAB — TSH: TSH: 0.33 u[IU]/mL (ref 0.27–4.20)

## 2021-07-31 NOTE — H&P (Signed)
[facility] at [facility]  History and Physical    Name: Isaiah Henderson  MRN: [id number]  DOB: 04-13-1980  Date of Encounter: July 31, 2021  Medical Providers:  Referring: Webb Silversmith, MD  PCP: Webb Silversmith, MD    CC:  Morbid obesity with desire for weight loss.    HPI:  Isaiah Henderson is a 41 y.o. male who presents at the request of Webb Silversmith, MD for bariatric surgical consultation.  His body mass index is 55.67 kg/m. The patient presents with obesity.  The severity is morbid.  The location is central and peripheral.  The duration has been greater than 5 years.  The quality has been stable.  He has failed medical treatment. He does have these associated symptoms obstructive sleep apnea, hypertension, asthma, hyperlipidemia, non-insulin dependent diabetes  .    Desired procedure: The patient is interested in Laparoscopic Roux-en-Y Gastric Bypass       with Isaiah Henderson, M.D., F.A.C.S.  .    Medical Diagnoses:  1. Morbid obesity with BMI of 50.0-59.9, adult    2. Morbid obesity    3. Hypertension, unspecified type    4. Hyperlipidemia, unspecified hyperlipidemia type    5. Sleep apnea, unspecified type    6. Diabetes mellitus, type 2         PMH:  Past Medical History:   Diagnosis Date   . Asthma    . Diabetes mellitus, type 2    . Hyperlipidemia    . Hypertension    . Liver disease     fatty liver   . Morbid obesity    . Sleep apnea    . Snoring        Weight History:   Isaiah Henderson has been obese:  No flowsheet data found.     Weight loss programs/medications:   Isaiah Henderson has attempted the following weight loss programs and medications in the past:  No flowsheet data found.     No flowsheet data found.    Exercise History:   Excercise History 07/31/2021   Overweight At Least 5 Years Yes   Functional Limits Requires Wheelchair        PSH:  Past Surgical History:   Procedure Laterality Date   . TONSILLECTOMY          Current Medications, Vitamins, and Supplements  list:  Medications/Vitamins/Supplements         Last Dose Start Date End Date Provider     albuterol HFA (PROVENTIL, VENTOLIN, PROAIR HFA) 108 (90 Base) MCG/ACT inhaler As Needed  04/03/21  --  [provider]     Inhale 2 puffs into the lungs every 4 hours as needed     amLODIPine (NORVASC) 10 mg tablet Taking  04/04/21  10/01/21  [provider]     Take 1 tablet (10 mg total) by mouth daily     aspirin 81 mg EC tablet Taking  --  --  [provider]     Take 1 tablet (81 mg total) by mouth     atorvastatin (LIPITOR) 20 mg tablet Taking  04/03/21  --  [provider]     Take 1 tablet (20 mg total) by mouth daily     blood glucose test strip Taking  07/24/20  --  [provider]     Use to test 4 times a day dx E11.9     carvedilol (COREG) 25 mg tablet Taking  05/28/21  --  [provider]     Take 1 tablet (25 mg total) by mouth 2 times daily     cholecalciferol, Vitamin D3, (VITAMIN D3) 50 mcg (2,000 unit) capsule  --  04/03/21  --  [provider]     Take 1 capsule (2,000 units total) by mouth daily     cyclobenzaprine (FLEXERIL) 10 mg tablet Taking  11/20/20  --  [provider]     Take 1 tablet (10 mg total) by mouth as needed     diclofenac (VOLTAREN) 1 % gel As Needed  01/27/21  --  [provider]     Apply 2 g topically 4 times daily as needed     empagliflozin (JARDIANCE) 10 mg tablet Taking  04/04/21  03/30/22  [provider]     Take 1 tablet (10 mg total) by mouth daily     fluticasone (FLONASE) 50 MCG/ACT nasal spray As Needed  06/03/20  --  [provider]     Spray 2 sprays into nostril daily     hydroxychloroquine (PLAQUENIL) 200 mg tablet Taking  03/04/21  03/04/22  [provider]     Take 2 tablets (400 mg total) by mouth every evening     indapamide (LOZOL) 2.5 MG tablet Taking  05/21/21  --  [provider]     SMARTSIG:0.5 Tablet(s) By Mouth Every Morning      ipratropium-albuterol (DUONEB) 0.5-2.5mg  /67mL nebulizer solution As Needed  06/03/20  --  [provider]     Inhale 3 mLs into the lungs every 4 hours as needed     levocetirizine (XYZAL) 5 mg tablet Taking  --  --  [provider]     Take 1 tablet (5 mg total) by mouth every evening     losartan (COZAAR) 50 mg tablet Taking  05/28/21  --  [provider]     Take 1 tablet (50 mg total) by mouth daily     montelukast (SINGULAIR) 10 mg tablet As Needed  04/03/21  --  [provider]     Take 1 tablet (10 mg total) by mouth nightly     topiramate (TOPAMAX) 25 mg tablet Taking  07/18/21  --  [provider]     Take 1 tablet (25 mg total) by mouth 2 times daily     VITAMIN D PO  --  --  --  [provider]     Take 1 capsule by mouth daily          Allergies:  No Known Allergies (drug, envir, food or latex)    Disability History:   Disability 07/31/2021   Is the Patient Disabled? No   Hearing Impaired? No       Family History:  Family History   Problem Relation Age of Onset   . Morbid Obesity Mother    . High cholesterol Mother    . Diabetes Type I Mother    . Asthma Mother    . Arthritis Mother    . Morbid Obesity Father    . High cholesterol Father    . Diabetes Type I Father    . Cancer Father    . Arthritis Father         Social History:  Isaiah Henderson currently resides in [place] [address].  Occupation:   Marital Status: Unknown  Children:   Support Person:     Tobacco History: He  reports that he quit  smoking about 19 years ago. His smoking use included cigarettes. He has never used smokeless tobacco.  Alcohol History: Isaiah Henderson  reports current alcohol use.  Ellicit drug use: He  reports no history of drug use.     Review of Systems:  Review of Systems   Constitutional: Negative for chills, diaphoresis and fever.   HENT: Negative for hearing loss, nosebleeds and tinnitus.    Eyes: Negative for blurred vision and double vision.   Respiratory: Positive for cough.  Negative for shortness of breath and wheezing.    Cardiovascular: Negative for chest pain, palpitations and leg swelling.   Gastrointestinal: Negative for abdominal pain, blood in stool, constipation, diarrhea, heartburn, nausea and vomiting.   Genitourinary: Negative for dysuria, frequency and urgency.   Musculoskeletal: Positive for back pain. Negative for joint pain, myalgias and neck pain.   Skin: Negative for itching and rash.   Neurological: Negative for dizziness, tingling, tremors, seizures, loss of consciousness and headaches.   Psychiatric/Behavioral: Negative for depression and substance abuse. The patient is not nervous/anxious and does not have insomnia.        Physical Exam:  Blood pressure (!) 119/110, pulse 106, temperature 35.9 C (96.6 F), resp. rate 18, height 1.778 m (5\' 10" ), weight (!) 176 kg (388 lb), SpO2 96 %.   Percent Excess Weight Loss: 0 Percent     Physical Exam  Vitals reviewed.   Constitutional:       Appearance: Normal appearance.   HENT:      Head: Normocephalic and atraumatic.      Nose: Nose normal.   Eyes:      Extraocular Movements: Extraocular movements intact.   Cardiovascular:      Rate and Rhythm: Normal rate and regular rhythm.      Pulses: Normal pulses.      Heart sounds: Normal heart sounds.   Pulmonary:      Effort: Pulmonary effort is normal.      Breath sounds: Normal breath sounds.   Abdominal:      General: There is no distension.      Palpations: Abdomen is soft.      Tenderness: There is no abdominal tenderness.   Musculoskeletal:         General: Normal range of motion.      Cervical back: Normal range of motion and neck supple.      Right lower leg: Edema present.   Skin:     General: Skin is warm.   Neurological:      General: No focal deficit present.      Mental Status: He is alert and oriented to person, place, and time.   Psychiatric:         Mood and Affect: Mood normal.         STOP-BANG Questionnaire: N/A risk, sleep med referral: [facility]      Assessment:  Isaiah Henderson is a 41 y.o. male who presents for bariatric surgery.   Planned Procedure(s): Laparoscopic Roux-en-Y Gastric Bypass.       Prior Labs and Reports Reviewed  No results found.      Discussion:  After a thorough evaluation patient appears to be an appropriate candidate for weight loss surgery, pending results of requested testing and/or all requested support letters.  We had a brief discussion regarding the risks and benefits of weight loss surgery, answering all questions raised, and the patient voiced a clear understanding.  Further, more detailed discussion will take place during the  consultation visit with the surgeon.    Patient did not take BP meds this am    Plan:  Orders Placed This Encounter   Procedures   . H. pylori antigen, stool   . US abdominal complete   . TSH   . Hemoglobin A1c     Procedure: Laparoscopic Roux-en-Y Gastric Bypass         He was instructed to discontinue NSAIDS as pain medication after surgery due to risk of ulceration of the gastric pouch.  It was explained that ulcers can perforate, bleed, and/or cause strictures.  It was explained that Tylenol based medications are acceptable.  He was instructed to begin a daily multi vitamin regimen now and that he will be required to follow written instructions regarding a multi vitamin/supplementation regimen post surgery.  Isaiah Henderson was instructed to bring his CPAP/BiPAP mask with him on the day of surgery.    Follow up in about 2 weeks (around 08/14/2021).     Was an interpreter used during this appointment?  No

## 2021-08-01 LAB — HEMOGLOBIN A1C: Hemoglobin A1C: 6.5 % — ABNORMAL HIGH

## 2021-08-18 ENCOUNTER — Ambulatory Visit: Payer: BLUE CROSS/BLUE SHIELD | Admitting: Registered"

## 2021-08-25 ENCOUNTER — Other Ambulatory Visit: Payer: Self-pay | Admitting: Obstetrics and Gynecology

## 2021-08-25 ENCOUNTER — Encounter: Payer: Self-pay | Admitting: Gastroenterology

## 2021-08-25 ENCOUNTER — Other Ambulatory Visit
Admission: RE | Admit: 2021-08-25 | Discharge: 2021-08-25 | Disposition: A | Discharge status: Home / Self Care | Payer: BLUE CROSS/BLUE SHIELD | Source: Ambulatory Visit | Attending: Surgery | Admitting: Surgery

## 2021-08-25 LAB — H. PYLORI ANTIGEN, STOOL: H. pylori Stool Antigen EIA: POSITIVE — AB

## 2021-08-25 NOTE — Patient Instructions (Signed)
Visit Information  Bruce [name] was given information about Medicaid Managed Care team care coordination services as a part of their [facility] Community Plan Medicaid benefit. Bruce [name] verbally consented to engagement with the [facility] Managed Care team.   If you are experiencing a medical emergency, please call 911 or report to your local emergency department or urgent care.   If you have a non-emergency medical problem during routine business hours, please contact your provider's office and ask to speak with a nurse.   For questions related to your [facility], please call: [phone] or visit the homepage here: [email or web address]  If you would like to schedule transportation through your [facility], please call the following number at least 2 days in advance of your appointment: [phone]   Rides for urgent appointments can also be made after hours by calling Member Services.  Call the Behavioral Health Crisis Line at [phone], at any time, 24 hours a day, 7 days a week. If you are in danger or need immediate medical attention call 911.  If you would like help to quit smoking, call 1-800-QUIT-NOW (571-366-3453) OR Espaol: 1-855-Djelo-Ya (8-315-176-1607) o para ms informacin haga clic aqu or Text READY to 371-062 to register via text  Bruce [name] - following are the goals we discussed in your visit today:   Goals Addressed    Long-Range Goal: Establish Plan of Care   Priority: High  Note:   Timeframe:  Long-Range Goal Priority:  High Start Date:   08/25/21                          Expected End Date:     ongoing                  Follow Up Date 09/29/21    - schedule appointment for vaccines needed due to my age or health - schedule recommended health tests - schedule and keep appointment for annual check-up    Why is this important?   Screening  tests can find diseases early when they are easier to treat.  Your doctor or nurse will talk with you about which tests are important for you.  Getting shots for common diseases like the flu and shingles will help prevent them.     Patient verbalizes understanding of instructions and care plan provided today and agrees to view in MyChart. Active MyChart status and patient understanding of how to access instructions and care plan via MyChart confirmed with patient.     The Managed Medicaid care management team will reach out to the patient again over the next 30 business days.  The  Patient has been provided with contact information for the Managed Medicaid care management team and has been advised to call with any health related questions or concerns.   Kathi Der RN, [name] [place]  [facility] Care Management Coordinator - Managed Medicaid High Risk [phone]   Following is a copy of your plan of care:  Care Plan : RN Care Manager Plan of Care  Updates made by Danie Chandler, RN since 08/25/2021 12:00 AM     Problem: Chronic Disease Management and Care Coordination Needs for TBI & Pain Management   Priority: High     Long-Range Goal: Development of Plan of Care for Chronic Disease Management and Care Coordination Needs (TBI, Pain Management)   Start Date: 05/21/2021  Expected End Date: 11/25/2021  Priority: High  Note:   Current Barriers:  Knowledge Deficits related to plan of care for management of Chronic Pain and Traumatic Brain Injury (TBI)  Care Coordination needs related to Financial constraints related to securing food, Limited social support, Limited access to food, Level of care concerns, Medication procurement, ADL IADL limitations, Mental Health Concerns , Family and relationship dysfunction, Social Isolation, Literacy concerns, Limited access to caregiver, Cognitive Deficits, Memory Deficits, Inability to perform ADL's independently, Inability to perform  IADL's independently, and Lacks knowledge of community resource: PCS services  Chronic Disease Management support and education needs related to Chronic Pain and TBI Lacks caregiver support.        Corporate treasurer.  Literacy barriers Non-adherence to prescribed medication regimen Difficulty obtaining medications Cognitive Deficits Falls 08/25/21:  Patient has appt with Gwinda Passe 09/09/21-encouraged patient to attend appt to address concerns about pain and therapy  RNCM Clinical Goal(s):  Patient will verbalize understanding of plan for management of TBI and Chronic Pain as evidenced by improved management of TBI & Chronic Pain verbalize basic understanding of TBI and Chronic Pain disease process and self health management plan as evidenced by noted improvement in management of TBI and Chronic Pain take all medications exactly as prescribed and will call provider for medication related questions as evidenced by being compliant with all medications    attend all scheduled medical appointments: 09/09/21 Gwinda Passe - New patient visit for new PCP as evidenced by attending all scheduled appointments        demonstrate improved adherence to prescribed treatment plan for TBI and Chronic Pain Management as evidenced by improved management of TBI and Chronic Pain continue to work with RN Care Manager and/or Social Worker to address care management and care coordination needs related to TBI and Chronic Pain as evidenced by adherence to CM Team Scheduled appointments     work with Child psychotherapist to address Financial constraints related to securing food, Limited social support, Limited access to food, Level of care concerns, Medication procurement, ADL IADL limitations, Mental Health Concerns , Family and relationship dysfunction, Social Isolation, Literacy concerns, Limited access to caregiver, Cognitive Deficits, Memory Deficits, Inability to perform ADL's independently, Inability to perform  IADL's independently, and Lacks knowledge of community resource: PCS services related to the management of TBI & Chronic Pain as evidenced by review of EMR and patient or Child psychotherapist report     work with [employer] and BSW to provide support and community resources as evidenced by on-going communication with [employer] and BSW  through collaboration with Medical illustrator, provider, and care team.   Interventions: Inter-disciplinary care team collaboration (see longitudinal plan of care) Evaluation of current treatment plan related to  self management and patient's adherence to plan as established by provider  Traumatic Brain Injury (TBI)  (Status:  Goal on track:  Yes.)  Long Term Goal Evaluation of current treatment plan related to  TBI , Limited social support, ADL IADL limitations, Mental Health Concerns , Family and relationship dysfunction, Social Isolation, Literacy concerns, Limited access to caregiver, Cognitive Deficits, Memory Deficits, Inability to perform ADL's independently, Inability to perform IADL's independently, and Lacks knowledge of community resource: PCS services,  self-management and patient's adherence to plan as established by provider. Discussed plans with patient for ongoing care management follow up and provided patient with direct contact information for care management team Evaluation of current treatment plan related to management of TBI and patient's adherence to plan as established by provider Reviewed medications  with patient and discussed importance of medication compliance Reviewed scheduled/upcoming provider appointments including Discussed plans with patient for ongoing care management follow up and provided patient with direct contact information for care management team  Patient more coherent today but continues to have flight of ideas during our conversation. Patient reports that [facility] home care aide services has started but is inconsistent with showing up due to car  trouble.  The [facility] aide services is scheduled for 2 1/2 hours every day but is inconsistent.  Patient talked about aide breaking patient's mop.  Previously encouraged patient to call home care agency to see if a different aide can be assigned.  [employer] continues to follow patient regarding financial assistance with utilities. Patient wants to restart physical therapy after he gets back on pain medications.  Patient thinks he will be able to function better with doing therapy.  Currently he is not on any pain medication so he will not move forward with physical therapy.  Pain:  (Status: Goal on track: NO.) Long Term Goal  Pain assessment performed.  Patient continues to report constant pain in his arms bilaterally.  Patient rates his pain as "so bad" on a pain scale. Patient raises voice when talking about his pain.  Patient states when he gets worked up then his pain level goes higher.   Medications reviewed.  Currently patient is NOT taking any pain medications, even OTC ibuprofen. Patient had an appointment with Dr. Ewing Schlein MD - Pain Medicine Specialist on 07/02/2021.  Patient did not receive a prescription for any pain medication.  Patient verbalized how he wants to find a new PCP in order to address pain management.  This RN Care Manager placed a telephone call to [facility] Physician Referral Line at [phone] to assist with finding the patient a new PCP.   Appointment made with Dr. Fanny Bien at [facility] on June 15,2023 at 2:00 pm.   This RN Care Manager re-enforced the fact that patient does have an appointment with Gwinda Passe NP on 09/09/2021 in order to establish a new PCP.  Patient will need to cancel this appointment if he chooses to establish Dr. Janee Morn as his new PCP. 08/25/21:  patient did not attend appointment 6/15-encourgaed to keep appt 09/09/21. Reviewed provider established plan for pain management; Discussed importance of adherence to all  scheduled medical appointments; Counseled on the importance of reporting any/all new or changed pain symptoms or management strategies to pain management provider; Advised patient to report to care team affect of pain on daily activities; Reviewed with patient prescribed pharmacological and nonpharmacological pain relief strategies;  Patient Goals/Self-Care Activities: Take medications as prescribed   Attend all scheduled provider appointments Call pharmacy for medication refills 3-7 days in advance of running out of medications Call provider office for new concerns or questions  Work with the social worker to address care coordination needs and will continue to work with the clinical team to address health care and disease management related needs Get on a regular pain management regimen with effective pain medications. Receive PCS services to assist with all ADLs and IADLs - completed.
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
## 2021-08-28 ENCOUNTER — Other Ambulatory Visit: Payer: Self-pay | Admitting: Surgery

## 2021-08-28 ENCOUNTER — Encounter: Payer: Self-pay | Admitting: Surgery

## 2021-08-28 MED ORDER — METRONIDAZOLE 500 MG PO TABS *I*
500.0000 mg | ORAL_TABLET | Freq: Two times a day (BID) | ORAL | 0 refills | Status: AC
Start: 2021-08-28 — End: 2021-09-11

## 2021-08-28 MED ORDER — BISMUTH SUBSALICYLATE 262 MG/15ML PO SUSP *I*
30.0000 mL | Freq: Three times a day (TID) | ORAL | 0 refills | Status: AC
Start: 2021-08-28 — End: 2021-09-11

## 2021-08-28 MED ORDER — MINOCYCLINE HCL 100 MG PO CAPS *I*
100.0000 mg | ORAL_CAPSULE | Freq: Two times a day (BID) | ORAL | 0 refills | Status: AC
Start: 2021-08-28 — End: 2021-09-11

## 2021-09-01 ENCOUNTER — Ambulatory Visit: Payer: BLUE CROSS/BLUE SHIELD | Admitting: Registered"

## 2021-09-08 ENCOUNTER — Ambulatory Visit: Payer: BLUE CROSS/BLUE SHIELD | Admitting: Registered"

## 2021-09-09 ENCOUNTER — Ambulatory Visit (INDEPENDENT_AMBULATORY_CARE_PROVIDER_SITE_OTHER): Payer: Medicaid Other | Admitting: Primary Care

## 2021-09-09 ENCOUNTER — Encounter (INDEPENDENT_AMBULATORY_CARE_PROVIDER_SITE_OTHER): Payer: Self-pay | Admitting: Primary Care

## 2021-09-09 VITALS — BP 130/78 | HR 79 | Temp 98.4°F | Ht 65.0 in | Wt 125.4 lb

## 2021-09-09 DIAGNOSIS — G894 Chronic pain syndrome: Secondary | ICD-10-CM | POA: Diagnosis not present

## 2021-09-09 DIAGNOSIS — Z654 Victim of crime and terrorism: Secondary | ICD-10-CM

## 2021-09-09 DIAGNOSIS — F068 Other specified mental disorders due to known physiological condition: Secondary | ICD-10-CM | POA: Diagnosis not present

## 2021-09-09 DIAGNOSIS — F17210 Nicotine dependence, cigarettes, uncomplicated: Secondary | ICD-10-CM

## 2021-09-09 DIAGNOSIS — Z7689 Persons encountering health services in other specified circumstances: Secondary | ICD-10-CM

## 2021-09-09 DIAGNOSIS — S069XAS Unspecified intracranial injury with loss of consciousness status unknown, sequela: Secondary | ICD-10-CM

## 2021-09-09 DIAGNOSIS — S069X0S Unspecified intracranial injury without loss of consciousness, sequela: Secondary | ICD-10-CM | POA: Diagnosis not present

## 2021-09-09 DIAGNOSIS — Z765 Malingerer [conscious simulation]: Secondary | ICD-10-CM

## 2021-09-14 ENCOUNTER — Encounter (INDEPENDENT_AMBULATORY_CARE_PROVIDER_SITE_OTHER): Payer: Self-pay | Admitting: Primary Care
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
## 2021-09-16 ENCOUNTER — Telehealth: Payer: Self-pay

## 2021-09-16 NOTE — Telephone Encounter (Addendum)
Pt wishes to continue proceed with the bariatric program . He was advised against canceling or no showing for any future appointments. NP    09/30/21- Patient has to hold off scheduling appointments until insurance policy is reinstated in Sept, per pt.

## 2021-09-22 ENCOUNTER — Ambulatory Visit: Payer: BLUE CROSS/BLUE SHIELD | Admitting: Registered"

## 2021-09-23 ENCOUNTER — Ambulatory Visit: Payer: Self-pay | Admitting: Registered"

## 2021-09-29 ENCOUNTER — Other Ambulatory Visit: Payer: Self-pay | Admitting: Obstetrics and Gynecology

## 2021-09-29 NOTE — Patient Outreach (Signed)
Care Coordination  09/29/2021  Bruce [name] 02/09/1981 [id number]   RNCM called patient at scheduled time.  Patient answered the phone and stated I woke him up and asked to be called during an afternoon.  RNCM rescheduled appointment at patient request.  Kathi Der RN, [name] [place]  [facility] Care Management Coordinator - Managed [place] High Risk [phone].

## 2021-10-07 ENCOUNTER — Ambulatory Visit: Payer: Self-pay | Admitting: Registered"

## 2021-10-17 NOTE — Progress Notes (Signed)
Request CPAP settings

## 2021-11-03 ENCOUNTER — Ambulatory Visit: Payer: Medicaid Other | Admitting: Registered"

## 2021-11-03 DIAGNOSIS — Z6841 Body Mass Index (BMI) 40.0 and over, adult: Secondary | ICD-10-CM

## 2021-11-03 NOTE — Progress Notes (Signed)
Isaiah Henderson is a 41 y.o. y.o. male is here today for Initial Nutrition Visit regarding his Morbid Obesity.       Video Visit     Mode of Communication: Video    Location of Patient: Home    Location of Telemedicine Provider: Home      Other participants in telemedicine encounter and roles:  Peers    This is an established patient visit.    Reason for visit: Nutrition counseling    Consent was obtained from the patient to complete this video visit; including the potential for financial liability.        07/31/2021     2:41 PM   Vitals   Preop Weight 388 lb   Weight 388 lb   BAR Weight (lbs) 388   Height 5\' 10"    BAR Height (in) 70   BMI (Calculated) 55.8 kg/m2   BAR BMI (Calculated) 55.8 kg/m2   IBW in kg (Bariatric) 75.3   IBW in lbs (Bariatric) 166   Percent Excess Weight Loss 0 Percent   Weight Change for Visit (kg) 175.96 kg   Weight Change Total (kg) 0 kgs   Initial Excess Weight 100.66 kgs   Total Weight Loss Percent 0 %          07/31/2021     2:41 PM   Vitals   BP 119/110   Heart Rate 106   Resp 18   Temp 35.9 C (96.6 F)       Gurjot Pedrohenrique Metayer attended a 1 hour introductory nutrition class which educated him on the basic nutrition principles associated with modifying food choices and eating/drinking behaviors that will be necessary with bariatric surgery.    The class agenda included the following education:  -The importance of tracking and monitoring food intake with a food journal or App.  -Description of surgeries and the resulting changes in anatomy   -Explanation of the bariatric surgery eating and drinking techniques to begin practicing  -Sources of high sugar foods and the elimination of processed food  -Identification of appropriate beverage choices  -General principles of what constitutes a healthy plate inclusive of all the food groups  -Importance of eating 3 meals daily  -An outline of ideas to begin incorporating physical activity  -The value of goal setting and making incremental  changes     Goals the participant will be instructed to work on:  -Document a daily food diary  -Eat 3 meals per day  -Eliminate high sugar/high fat foods and beverages  -Review the nutrition packet       Follow up in 2 weeks    Time Spent Today with Patient:  60 minutes    Thank you for allowing me to participate in the care of [employer].  Please feel free to contact me directly at your convenience.    Gwenith Daily, RD

## 2021-11-06 ENCOUNTER — Other Ambulatory Visit: Payer: Self-pay | Admitting: Obstetrics and Gynecology

## 2021-11-06 NOTE — Patient Instructions (Signed)
Hi Mr. Torry, I am sorry I missed you today- as a part of your Medicaid benefit, you are eligible for care management and care coordination services at no cost or copay. I was unable to reach you by phone today but would be happy to help you with your health related needs. Please feel free to call me at [phone].  A member of the Managed Medicaid care management team will reach out to you again over the next 30 business  days.   Aida Raider RN, [name] [place]  [facility] - Managed Medicaid High Risk [phone]

## 2021-11-06 NOTE — Patient Outreach (Signed)
Care Coordination  11/06/2021  Blaise Grieshaber [birth year] [id number]   Medicaid Managed Care   Unsuccessful Outreach Note  11/06/2021 Name: Jacobs Golab MRN: [id number] DOB: 06-Jun-1980  Referred by: Fenton Foy, NP Reason for referral : High Risk Managed Medicaid (Unsuccessful telephone outreach)   An unsuccessful telephone outreach was attempted today. The patient was referred to the case management team for assistance with care management and care coordination.   Follow Up Plan: The care management team will reach out to the patient again over the next 30 business  days.   Aida Raider RN, [name] [place]  [facility] - Managed Medicaid High Risk [phone]

## 2021-11-17 ENCOUNTER — Ambulatory Visit: Payer: Medicaid Other | Admitting: Registered"

## 2021-11-17 DIAGNOSIS — Z6841 Body Mass Index (BMI) 40.0 and over, adult: Secondary | ICD-10-CM

## 2021-11-17 NOTE — Progress Notes (Signed)
Isaiah Henderson is a 41 y.o. y.o. male is here today for Nutrition Visit regarding his Morbid Obesity.       Video Visit     Mode of Communication: Video    Location of Patient: Home    Location of Telemedicine Provider: Home      Other participants in telemedicine encounter and roles:  Peers    This is an established patient visit.    Reason for visit: Nutrition counseling    Consent was obtained from the patient to complete this video visit; including the potential for financial liability.      07/31/2021     2:41 PM   Vitals   Preop Weight 388 lb   Weight 388 lb   BAR Weight (lbs) 388   Height 5\' 10"    BAR Height (in) 70   BMI (Calculated) 55.8 kg/m2   BAR BMI (Calculated) 55.8 kg/m2   IBW in kg (Bariatric) 75.3   IBW in lbs (Bariatric) 166   Percent Excess Weight Loss 0 Percent   Weight Change for Visit (kg) 175.96 kg   Weight Change Total (kg) 0 kgs   Initial Excess Weight 100.66 kgs   Total Weight Loss Percent 0 %          07/31/2021     2:41 PM   Vitals   BP 119/110   Heart Rate 106   Resp 18   Temp 35.9 C (96.6 F)       Isaiah Henderson attended a 1 hour nutrition class which educated him on principles of the bariatric lifestyle meal plan and the importance of following this plan for long-term success with their weight loss journey.      The class agenda included the following education:  -Review of the food groups, portion control and required daily servings to meet nutritional needs  -How to design an effective schedule for meals and snacks to stay satisfied throughout the day  -Outline of snack guidelines and tips for dining out  -Utilizing the plate as a visual model to plan healthy balanced meals  -Steps for meal planning: creating a weekly menu, shopping list and prepping meals in advance  -Importance and role of vitamin and mineral supplementation before and after surgery  -Benefits of physical activity and striving to move more  -Progression of meal plans following surgery    The dietitian  conducts a class exercise in creating a sample balanced meal plan for a day. Participants then reference their own food diary to assess their current eating behaviors and identify goals to work on prior to their next individual nutrition appointment.  Participants are expected to continue to document a daily food diary.     Goals to work on until the next dietitian appointment are the following:     Eating 3 meals daily.  Eating at least 3 servings of non-starchy vegetables each day.  Planning and shopping for meals to be balanced.   Practicing not drinking 30 minutes before, during and 30 minutes after meals.     Follow up in 3-4 weeks    Time Spent Today with Patient:  60 minutes    Thank you for allowing me to participate in the care of [employer].  Please feel free to contact me directly at your convenience.    Gwenith Daily, RD

## 2021-11-18 ENCOUNTER — Other Ambulatory Visit: Payer: Self-pay

## 2021-11-25 ENCOUNTER — Ambulatory Visit: Payer: Self-pay | Admitting: Nurse Practitioner

## 2021-11-27 ENCOUNTER — Ambulatory Visit: Payer: Self-pay | Admitting: Nurse Practitioner

## 2021-12-11 ENCOUNTER — Other Ambulatory Visit: Payer: Self-pay | Admitting: Obstetrics and Gynecology
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
## 2021-12-11 NOTE — Patient Instructions (Signed)
Hi Bruce [name], thanks for speaking with me today, have a nice afternoon!  Bruce [name] was given information about Medicaid Managed Care team care coordination services as a part of their [place] Medicaid benefit. Bruce [name] verbally consented to engagement with the [facility] Managed Care team.   If you are experiencing a medical emergency, please call 911 or report to your local emergency department or urgent care.   If you have a non-emergency medical problem during routine business hours, please contact your provider's office and ask to speak with a nurse.   For questions related to your [facility], please call: [phone] or visit the homepage here: [email or web address]  If you would like to schedule transportation through your [facility], please call the following number at least 2 days in advance of your appointment: [phone]   Rides for urgent appointments can also be made after hours by calling Member Services.  Call the [place] at [phone], at any time, 24 hours a day, 7 days a week. If you are in danger or need immediate medical attention call 911.  If you would like help to quit smoking, call 1-800-QUIT-NOW 4075844065) OR Espaol: 1-855-Djelo-Ya (9-767-341-9379) o para ms informacin haga clic aqu or Text READY to 200-400 to register via text  Bruce [name] - following are the goals we discussed in your visit today:   Goals Addressed    Timeframe:  Long-Range Goal Priority:  High Start Date:   08/25/21                          Expected End Date:     ongoing                  Follow Up Date 01/13/22   - schedule appointment for vaccines needed due to my age or health - schedule recommended health tests - schedule and keep appointment for annual check-up    Why is this important?   Screening tests can find  diseases early when they are easier to treat.  Your doctor or nurse will talk with you about which tests are important for you.  Getting shots for common diseases like the flu and shingles will help prevent them.  12/11/21:  patient needs appt with pain management and requests new PCP   Patient verbalizes understanding of instructions and care plan provided today and agrees to view in [place]. Active MyChart status and patient understanding of how to access instructions and care plan via MyChart confirmed with patient.     The Managed Medicaid care management team will reach out to the patient again over the next 30 business  days.  The  Patient  has been provided with contact information for the Managed Medicaid care management team and has been advised to call with any health related questions or concerns.   Aida Raider RN, [name] [place] Management Coordinator - Managed Medicaid High Risk [phone]  Following is a copy of your plan of care:  Care Plan : [place] of Care  Updates made by Gayla Medicus, RN since 12/11/2021 12:00 AM     Problem: Chronic Disease Management and Care Coordination Needs for TBI & Pain Management   Priority: High     Long-Range Goal: Development of Plan of Care for Chronic Disease Management and Care Coordination Needs (TBI, Pain Management)  Start Date: 05/21/2021  Expected End Date: 03/13/2022  Priority: High  Note:   Current Barriers:  Knowledge Deficits related to plan of care for management of Chronic Pain and Traumatic Brain Injury (TBI)  Care Coordination needs related to Financial constraints related to securing food, Limited social support, Limited access to food, Level of care concerns, Medication procurement, ADL IADL limitations, Mental Health Concerns , Family and relationship dysfunction, Social Isolation, Literacy concerns, Limited access to caregiver, Cognitive Deficits, Memory Deficits,  Inability to perform ADL's independently, Inability to perform IADL's independently, and Lacks knowledge of community resource: PCS services  Chronic Disease Management support and education needs related to Chronic Pain and TBI Lacks caregiver support.        Corporate treasurer.  Literacy barriers Non-adherence to prescribed medication regimen Difficulty obtaining medications Cognitive Deficits Falls 12/11/21:  patient states he does not have a pain management appointment yet and would like a new PCP.  Patient rates pain as an "8".  Currently not on any medication.  Patient requests follow up on pain management referral and PCP.  RNCM Clinical Goal(s):  Patient will verbalize understanding of plan for management of TBI and Chronic Pain as evidenced by improved management of TBI & Chronic Pain verbalize basic understanding of TBI and Chronic Pain disease process and self health management plan as evidenced by noted improvement in management of TBI and Chronic Pain take all medications exactly as prescribed and will call provider for medication related questions as evidenced by being compliant with all medications    attend all scheduled medical appointments as evidenced by attending all scheduled appointments        demonstrate improved adherence to prescribed treatment plan for TBI and Chronic Pain Management as evidenced by improved management of TBI and Chronic Pain continue to work with RN Care Manager and/or Social Worker to address care management and care coordination needs related to TBI and Chronic Pain as evidenced by adherence to CM Team Scheduled appointments     work with Child psychotherapist to address Financial constraints related to securing food, Limited social support, Limited access to food, Level of care concerns, Medication procurement, ADL IADL limitations, Mental Health Concerns , Family and relationship dysfunction, Social Isolation, Literacy concerns, Limited access to  caregiver, Cognitive Deficits, Memory Deficits, Inability to perform ADL's independently, Inability to perform IADL's independently, and Lacks knowledge of community resource: PCS services related to the management of TBI & Chronic Pain as evidenced by review of EMR and patient or Child psychotherapist report     work with [employer] and BSW to provide support and community resources as evidenced by on-going communication with [employer] and BSW through collaboration with Medical illustrator, provider, and care team.   Interventions: Inter-disciplinary care team collaboration (see longitudinal plan of care) Evaluation of current treatment plan related to  self management and patient's adherence to plan as established by provider BSW referral for new PCP Collaborated with BSW RNCM called [facility] to follow up on pain management referral placed by Gwinda Passe' office 7/31/.23.    Traumatic Brain Injury (TBI)  (Status:  Goal on track:  Yes.)  Long Term Goal Evaluation of current treatment plan related to  TBI , Limited social support, ADL IADL limitations, Mental Health Concerns , Family and relationship dysfunction, Social Isolation, Literacy concerns, Limited access to caregiver, Cognitive Deficits, Memory Deficits, Inability to perform ADL's independently, Inability to perform IADL's independently, and Lacks knowledge of community resource: PCS services,  self-management and patient's  adherence to plan as established by provider. Discussed plans with patient for ongoing care management follow up and provided patient with direct contact information for care management team Evaluation of current treatment plan related to management of TBI and patient's adherence to plan as established by provider Reviewed medications with patient and discussed importance of medication compliance Reviewed scheduled/upcoming provider appointments including Discussed plans with patient for ongoing care management follow up and  provided patient with direct contact information for care management team  Patient more coherent today but continues to have flight of ideas during our conversation. Patient reports that [facility] home care aide services has started but is inconsistent with showing up due to car trouble.  The [facility] aide services is scheduled for 2 1/2 hours every day but is inconsistent.  Patient talked about aide breaking patient's mop.  Previously encouraged patient to call home care agency to see if a different aide can be assigned.  [facility] BSW continues to follow patient regarding financial assistance with utilities. Patient wants to restart physical therapy after he gets back on pain medications.  Patient thinks he will be able to function better with doing therapy.  Currently he is not on any pain medication so he will not move forward with physical therapy.  Pain:  (Status: Goal on track: NO.) Long Term Goal  Pain assessment performed.  Patient continues to report constant pain.  Patient rates his pain as "8"on a pain scale. Patient raises voice when talking about his pain.  Patient states when he gets worked up then his pain level goes higher.   Medications reviewed.  Currently patient is NOT taking any pain medications, even OTC ibuprofen. Patient had an appointment with Dr. Ewing Schlein MD - Pain Medicine Specialist on 07/02/2021.  Marland Kitchen  Reviewed provider established plan for pain management; Discussed importance of adherence to all scheduled medical appointments; Counseled on the importance of reporting any/all new or changed pain symptoms or management strategies to pain management provider; Advised patient to report to care team affect of pain on daily activities; Reviewed with patient prescribed pharmacological and nonpharmacological pain relief strategies;  Patient Goals/Self-Care Activities: Take medications as prescribed   Attend all scheduled provider appointments Call pharmacy for medication refills 3-7 days  in advance of running out of medications Call provider office for new concerns or questions  Work with the social worker to address care coordination needs and will continue to work with the clinical team to address health care and disease management related needs Get on a regular pain management regimen with effective pain medications. Receive PCS services to assist with all ADLs and IADLs - completed.

## 2021-12-12 NOTE — Telephone Encounter (Signed)
Remaining medical clearances.

## 2021-12-17 ENCOUNTER — Ambulatory Visit: Payer: Medicaid Other | Admitting: Registered"

## 2021-12-17 NOTE — Progress Notes (Signed)
Nutrition Visit    Video Visit     Mode of Communication: Video    Location of Patient: Home    Location of Telemedicine Provider: Home    This is an established patient visit.    Reason for visit: Nutrition counseling    Consent was obtained from the patient to complete this video visit; including the potential for financial liability.        Isaiah Henderson is a 41 y.o. male is here today for a Medical Nutrition Visit regarding his Morbid Obesity.         07/31/2021     2:41 PM   Vitals   Preop Weight 388 lb   Weight 388 lb   BAR Weight (lbs) 388   Height 5\' 10"    BAR Height (in) 70   BMI (Calculated) 55.8 kg/m2   BAR BMI (Calculated) 55.8 kg/m2   IBW in kg (Bariatric) 75.3   IBW in lbs (Bariatric) 166   Percent Excess Weight Loss 0 Percent   Weight Change for Visit (kg) 175.96 kg   Weight Change Total (kg) 0 kgs   Initial Excess Weight 100.66 kgs   Total Weight Loss Percent 0 %     Patient stated weight is 399# 12/17/21      07/31/2021     2:41 PM   Vitals   BP 119/110   Heart Rate 106   Resp 18   Temp 35.9 C (96.6 F)       Desired procedure: The patient is interested in Laparoscopic Roux-en-Y Gastric Bypass       with Roe Rutherford, M.D., F.A.C.S.  .    Weight Loss Programs and Medications:   Mr. Macgill has attempted the following weight loss programs and medications in the past:  Registered dietitian 3 months 2022-3 months no weight loss    Visit Plan:  3 months    Bariatric Comorbidities:  He does have these associated symptoms obstructive sleep apnea, hypertension, asthma, hyperlipidemia, non-insulin dependent diabetes  .    Past Medical History:  Past Medical History:   Diagnosis Date    Asthma     Diabetes mellitus, type 2     Hyperlipidemia     Hypertension     Liver disease     fatty liver    Morbid obesity     Sleep apnea     Snoring        Social History:  Social History     Tobacco Use    Smoking status: Former     Types: Cigarettes     Quit date: 02/16/2002     Years since quitting: 19.8     Smokeless tobacco: Never   Substance Use Topics    Alcohol use: Yes     Comment: occasionally    Drug use: Never       Current Medications, Vitamins, and Supplements list:  Medications/Vitamins/Supplements           Last Dose Start Date End Date Provider     albuterol HFA (PROVENTIL, VENTOLIN, PROAIR HFA) 108 (90 Base) MCG/ACT inhaler  --  04/03/21  --  [provider]     Inhale 2 puffs into the lungs every 4 hours as needed     amLODIPine (NORVASC) 10 mg tablet (Expired)  --  04/04/21  10/01/21  [provider]     Take 1 tablet (10 mg total) by mouth daily     aspirin 81 mg EC tablet  --  --  --  [provider]     Take 1 tablet (81 mg total) by mouth     atorvastatin (LIPITOR) 20 mg tablet  --  04/03/21  --  [provider]     Take 1 tablet (20 mg total) by mouth daily     blood glucose test strip  --  07/24/20  --  [provider]     Use to test 4 times a day dx E11.9     carvedilol (COREG) 25 mg tablet  --  05/28/21  --  [provider]     Take 1 tablet (25 mg total) by mouth 2 times daily     cholecalciferol, Vitamin D3, (VITAMIN D3) 50 mcg (2,000 unit) capsule  --  04/03/21  --  [provider]     Take 1 capsule (2,000 units total) by mouth daily     cyclobenzaprine (FLEXERIL) 10 mg tablet  --  11/20/20  --  [provider]     Take 1 tablet (10 mg total) by mouth as needed     diclofenac (VOLTAREN) 1 % gel  --  01/27/21  --  [provider]     Apply 2 g topically 4 times daily as needed     empagliflozin (JARDIANCE) 10 mg tablet  --  04/04/21  03/30/22  [provider]     Take 1 tablet (10 mg total) by mouth daily     fluticasone (FLONASE) 50 MCG/ACT nasal spray  --  06/03/20  --  [provider]     Spray 2 sprays into nostril daily     hydroxychloroquine (PLAQUENIL) 200 mg tablet  --  03/04/21  03/04/22  [provider]     Take 2 tablets (400 mg total) by mouth every evening     indapamide  (LOZOL) 2.5 MG tablet  --  05/21/21  --  [provider]     SMARTSIG:0.5 Tablet(s) By Mouth Every Morning     ipratropium-albuterol (DUONEB) 0.5-2.5mg  /62mL nebulizer solution  --  06/03/20  --  [provider]     Inhale 3 mLs into the lungs every 4 hours as needed     levocetirizine (XYZAL) 5 mg tablet  --  --  --  [provider]     Take 1 tablet (5 mg total) by mouth every evening     losartan (COZAAR) 50 mg tablet  --  05/28/21  --  [provider]     Take 1 tablet (50 mg total) by mouth daily     montelukast (SINGULAIR) 10 mg tablet  --  04/03/21  --  [provider]     Take 1 tablet (10 mg total) by mouth nightly     topiramate (TOPAMAX) 25 mg tablet  --  07/18/21  --  [provider]     Take 1 tablet (25 mg total) by mouth 2 times daily     VITAMIN D PO  --  --  --  [provider]     Take 1 capsule by mouth daily            Allergies:  No Known Allergies (drug, envir, food or latex)    Diabetic Labs:  Hemoglobin A1C   Date Value Ref Range Status   07/31/2021 6.5 (H) % Final     Comment:     Ref Range <=5.6  HbA1c values of 5.7-6.4% indicate an increased risk for developing  diabetes  mellitus.  HbA1c values greater than or equal to 6.5% are diagnostic of  diabetes mellitus.  For diagnosis of diabetes in individuals without unequivocal  hyperglycemia, results should be confirmed by repeat testing.           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    Contributing Factors High Fat Food, Dines out often and Skips meals (late night eating; Pepsi-5-6- 20 ounce bottles daily; cookies)    Eating Behaviors Chewing Foods Well and In Process    Beverages Water (Pepsi-20 ounces 2-3  per week;koolaid; raspberry tea)    Current Activities Walking       Current Activity Duration: 1 mile, 3 times a week.       Goal Activity Duration: Increase to 4 times a week.    Current Intake Based on 24 hour diet recall, Inadequate vegetables and Inadequate fruit (skipped breakfast and lunch and had chicken wings for dinner; 10pm cheeseburgers and sprite)    Education Instructed patient on balanced meals and Reviewed standard portion sizes (Instructed patient on the importance of keeping daily food journal.)    Plan/Goals Review lifestyle meal plan packet, Scheduled for a follow up appointment, Eat three meals, Increase vegetables, Plan for meals, Measure foods, Record a food journal and Increase fruit (Change to sugar-free, non carbonated beverages)         Completed Food Journal:  no    Time Spent Today with Patient:  40 minutes    Interpreter Used During Session:  No    Patient Comprehension of Plan/Goals:  Good     Patient Motivation:  Poor    Thank you for allowing me to participate in the care of [employer]. Please feel free to contact me directly at your convenience.    Gwenith Daily, RD

## 2022-01-05 ENCOUNTER — Ambulatory Visit: Payer: Medicaid Other | Admitting: Social Worker

## 2022-01-07 ENCOUNTER — Ambulatory Visit: Payer: Medicaid Other | Admitting: Social Worker

## 2022-01-12 NOTE — Progress Notes (Signed)
Medical clearances remaining.

## 2022-01-13 ENCOUNTER — Other Ambulatory Visit: Payer: Medicaid Other | Admitting: Obstetrics and Gynecology

## 2022-01-13 ENCOUNTER — Encounter: Payer: Self-pay | Admitting: Obstetrics and Gynecology
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
## 2022-01-14 ENCOUNTER — Other Ambulatory Visit: Payer: Medicaid Other | Admitting: Obstetrics and Gynecology

## 2022-01-14 ENCOUNTER — Ambulatory Visit: Payer: Medicaid Other | Admitting: Registered"

## 2022-01-14 NOTE — Patient Outreach (Signed)
Care Coordination  01/14/2022  Bruce [name] October 06, 1980 [id number]  RNCM called [facility], left message.  RNCM collaborated with Managed Medicaid BSW, [name], regarding ongoing case management services.  Kathi Der RN, [name] [place]  Triad Engineer, production - Managed Medicaid High Risk [phone]

## 2022-01-19 ENCOUNTER — Encounter: Payer: Self-pay | Admitting: Surgery

## 2022-01-21 ENCOUNTER — Other Ambulatory Visit: Payer: Medicaid Other | Admitting: Obstetrics and Gynecology

## 2022-01-21 NOTE — Patient Outreach (Signed)
Care Coordination  01/21/2022  Bruce [name] 03-08-1980 [id number]  RNCM placed call to [facility] and left message for return call.  Kathi Der RN, [name] [place]  [facility] Engineer, production - Managed Medicaid High Risk [phone]

## 2022-01-23 ENCOUNTER — Other Ambulatory Visit: Payer: Medicaid Other | Admitting: Obstetrics and Gynecology

## 2022-01-23 NOTE — Patient Outreach (Signed)
Care Coordination  01/23/2022  Bruce [name] Jan 29, 1981 [id number]  Complex Case discussion was held this morning.  Plan is to collaborate with patient's health plan provider and ensure patient is enrolled in program that meets his needs.  Kathi Der RN, [name] [place]  Triad Engineer, production - Managed Medicaid High Risk [phone]

## 2022-02-17 ENCOUNTER — Other Ambulatory Visit: Payer: Medicaid Other | Admitting: Obstetrics and Gynecology

## 2022-02-17 NOTE — Patient Outreach (Signed)
  Medicaid Managed Care   Unsuccessful Attempt Note   02/17/2022 Name: Jobie Popp MRN: [id number] DOB: 25-Aug-1980  Referred by: Fenton Foy, NP Reason for referral : High Risk Managed Medicaid (Unsuccessful telephone outreach)   An unsuccessful telephone outreach was attempted today. The patient was referred to the case management team for assistance with care management and care coordination.    Follow Up Plan: The Managed Medicaid care management team will reach out to the patient again over the next 30 business  days. and The  Patient has been provided with contact information for the Managed Medicaid care management team and has been advised to call with any health related questions or concerns.    Aida Raider RN, [name]   Triad Curator - Managed Medicaid High Risk [phone]

## 2022-02-17 NOTE — Patient Instructions (Signed)
Visit Information  [name]  - as a part of your Medicaid benefit, you are eligible for care management and care coordination services at no cost or copay. I was unable to reach you by phone today but would be happy to help you with your health related needs. Please feel free to call me at [phone].  A member of the Managed Medicaid care management team will reach out to you again over the next 30 business  days.   Aida Raider RN, [name] [place]  [facility] Curator - Managed Medicaid High Risk [phone]

## 2022-03-16 ENCOUNTER — Encounter: Payer: Self-pay | Admitting: Obstetrics and Gynecology

## 2022-03-16 ENCOUNTER — Other Ambulatory Visit: Payer: Medicaid Other | Admitting: Obstetrics and Gynecology
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
## 2022-03-17 ENCOUNTER — Other Ambulatory Visit: Payer: Medicaid Other | Admitting: Obstetrics and Gynecology

## 2022-03-17 NOTE — Patient Outreach (Signed)
Care Coordination  03/17/2022  Kuzey Ogata [birth year] [id number]  RNCM returned call from Cornelia Copa, [facility] clinical liaison.  No answer, left message.  Aida Raider RN, [name] [place]  Triad Curator - Managed Medicaid High Risk [phone]

## 2022-03-20 ENCOUNTER — Other Ambulatory Visit: Payer: Medicaid Other | Admitting: Obstetrics and Gynecology

## 2022-03-20 NOTE — Patient Outreach (Signed)
Care Coordination  03/20/2022  [place] 09/27/1980 [id number]  RNCM returned phone call to insurance provider again, no answer and left message.  Aida Raider RN, [name] [place]  Triad Curator - Managed Medicaid High Risk [phone]

## 2022-03-20 NOTE — Patient Outreach (Signed)
Care Coordination  03/20/2022  Khalee Mazo 1980-08-05 [id number]  RNCM returned phone call to [facility] with insurance plan regarding patient. No answer and message left.  Aida Raider RN, [name] [place]  Triad Curator - Managed Medicaid High Risk [phone]

## 2022-03-23 ENCOUNTER — Other Ambulatory Visit: Payer: Medicaid Other | Admitting: Obstetrics and Gynecology

## 2022-03-23 NOTE — Patient Outreach (Signed)
Care Coordination  03/23/2022  Amor Packard 1980/10/19 [id number]  RNCM placed call to Dagmar Hait with [phone] ext. [phone].  No answer, left message.  Aida Raider RN, [name] [place]  Triad Curator - Managed Medicaid High Risk [phone]

## 2022-04-13 ENCOUNTER — Other Ambulatory Visit: Payer: Medicaid Other | Admitting: Obstetrics and Gynecology

## 2022-04-13 NOTE — Patient Outreach (Signed)
Care Coordination  04/13/2022  Jaymon Ledwell Oct 14, 1980 [id number]  RNCM called [employer] representative Dagmar Hait for updates, no answer, message left, [phone] ext. [phone].  Aida Raider RN, [name] [place]  [facility] Curator - Managed Medicaid High Risk [phone]

## 2022-04-14 ENCOUNTER — Ambulatory Visit: Payer: Medicaid Other | Admitting: Obstetrics and Gynecology

## 2022-04-21 ENCOUNTER — Other Ambulatory Visit: Payer: Medicaid Other | Admitting: Obstetrics and Gynecology

## 2022-04-21 NOTE — Patient Outreach (Signed)
Care Coordination  04/21/2022  Elden An May 14, 1980 [id number]  RNCM received phone call from [facility].  [employer] having difficulty assisting patient with PCP appointment.  PCP appointment is needed to continue PCS.  [employer]HC provided with BSW contact information for any possible assistance/information.  Aida Raider RN, [name] [place]  Triad Curator - Managed Medicaid High Risk [phone]

## 2022-05-14 ENCOUNTER — Other Ambulatory Visit: Payer: Medicaid Other | Admitting: Obstetrics and Gynecology

## 2022-05-14 NOTE — Patient Outreach (Addendum)
Care Coordination  05/14/2022  Gabreal Howick 08/02/1980 [id number]  RNCM called [employer], Elmyra Ricks, [phone], ext. [phone], and left message.  Patient has PCP appointment scheduled 05/26/22 with Dorna Mai.  RNCM, [place] signing off. Care plan and case will be closed. Aquilla Solian with [place] Medicaid and Margarita Mail, [place] Specialist at [facility] will engage the patient to help connect him to the appropriate Tailored Care Management Plan for ongoing follow up. The patient will have direct access to the most appropriate resources via his TCM plan."     Hannah Crill RN, [place] Management Coordinator - Managed Medicaid High Risk [phone].

## 2022-05-18 DIAGNOSIS — Z419 Encounter for procedure for purposes other than remedying health state, unspecified: Secondary | ICD-10-CM | POA: Diagnosis not present

## 2022-05-26 ENCOUNTER — Ambulatory Visit (INDEPENDENT_AMBULATORY_CARE_PROVIDER_SITE_OTHER): Payer: Medicaid Other | Admitting: Family Medicine

## 2022-05-26 ENCOUNTER — Encounter: Payer: Self-pay | Admitting: Family Medicine

## 2022-05-26 VITALS — BP 119/77 | HR 73 | Temp 98.1°F | Resp 18 | Ht 66.0 in | Wt 125.2 lb

## 2022-05-26 DIAGNOSIS — Z7689 Persons encountering health services in other specified circumstances: Secondary | ICD-10-CM | POA: Diagnosis not present

## 2022-05-26 DIAGNOSIS — Z87820 Personal history of traumatic brain injury: Secondary | ICD-10-CM | POA: Diagnosis not present

## 2022-05-26 DIAGNOSIS — G894 Chronic pain syndrome: Secondary | ICD-10-CM
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
## 2022-05-29 ENCOUNTER — Ambulatory Visit: Payer: Self-pay

## 2022-05-29 NOTE — Telephone Encounter (Signed)
  Chief Complaint: Pain - does not want to go to pain clinic he was referred to. Symptoms: Pain Frequency: ongoing Pertinent Negatives: Patient denies  Disposition: [] ED /[] Urgent Care (no appt availability in office) / [] Appointment(In office/virtual)/ []  [place] [facility]/ [] Home Care/ [] Refused Recommended Disposition /[] [place] [facility]/ [x]  Follow-up with PCP Additional Notes: Pt does not want to go to pain clinic her was referred to. He wants a different pain clinic.   Also Pt states that pain is very intense in arm. He states he will go to ED or call EMS to go to ED if needed for pain medication.    Summary: pain management referral / pain in both arms and back.   Pt stated he was given a referral by Dr. Andrey Campanile. He called [facility] and stated, "He hung up on me." He researched Dr. Tyler Deis, Ethelene Browns, and he saw that what he specializes in is suboxone treatment, methadone alternative stated that this is for junkies. Would like a referral to be sent to a different Dr. for pain management.  Pt stated he needs done as soon as possible.  Stated he is in a lot of pain both arms and back.  Seeking clinical advice.     Reason for Disposition  [1] Caller requesting NON-URGENT health information AND [2] PCP's office is the best resource  Answer Assessment - Initial Assessment Questions 1. REASON FOR CALL or QUESTION: "What is your reason for calling today?" or "How can I best help you?" or "What question do you have that I can help answer?"     Pt does not want to see Dr. Andrey Campanile for pain management. 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  Protocols used: Information Only Call - No Triage-A-AH

## 2022-06-09 DIAGNOSIS — M25561 Pain in right knee: Secondary | ICD-10-CM | POA: Diagnosis not present

## 2022-06-09 DIAGNOSIS — Z87820 Personal history of traumatic brain injury: Secondary | ICD-10-CM | POA: Diagnosis not present

## 2022-06-09 DIAGNOSIS — M79602 Pain in left arm: Secondary | ICD-10-CM | POA: Diagnosis not present

## 2022-06-09 DIAGNOSIS — Z79899 Other long term (current) drug therapy: Secondary | ICD-10-CM | POA: Diagnosis not present

## 2022-06-09 DIAGNOSIS — Z681 Body mass index (BMI) 19 or less, adult: Secondary | ICD-10-CM | POA: Diagnosis not present

## 2022-06-11 ENCOUNTER — Telehealth: Payer: Self-pay

## 2022-06-11 DIAGNOSIS — Z79899 Other long term (current) drug therapy: Secondary | ICD-10-CM | POA: Diagnosis not present

## 2022-06-11 NOTE — Telephone Encounter (Signed)
Copied from CRM [id number]. Topic: General - Inquiry >> Jun 08, 2022  3:26 PM [place] wrote: Reason for CRM: Pt stated he needs home aid. He stated that he is currently with [facility] but would like to change to a different company.  Stated needs someone who can help with housekeeping, meal preparation, and bathing.Pt stated he would like for this to go to [facility].  Insurance company advised in order for him to go to the gym he needs a diabetic eye exam and he would like to set this up.    Please advise.
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
## 2022-06-26 NOTE — Telephone Encounter (Signed)
Pls advise.   CRM / msg fowarded.

## 2022-06-26 NOTE — Telephone Encounter (Signed)
I have attempted without success to contact this patient by phone to return their call and will try again later.

## 2022-06-29 ENCOUNTER — Telehealth: Payer: Self-pay | Admitting: *Deleted

## 2022-06-29 NOTE — Telephone Encounter (Signed)
Copied from CRM [id number]. Topic: General - Other >> Jun 26, 2022  5:14 PM Ja-Kwan M wrote: Reason for CRM: Pt stated that he was returning a missed call that he received from the office. Attempted to contact flow coordinator but there was no answer. Pt requests call back at [phone]

## 2022-06-30 NOTE — Telephone Encounter (Signed)
Patient was called and he spoke of need ing a home health aide for assistants. Patient said that he previously had one but things did not work out.  -Will make an  appt with provider to talk with him

## 2022-07-03 ENCOUNTER — Other Ambulatory Visit: Payer: Self-pay | Admitting: Family Medicine

## 2022-07-07 DIAGNOSIS — Z79899 Other long term (current) drug therapy: Secondary | ICD-10-CM | POA: Diagnosis not present

## 2022-07-07 DIAGNOSIS — R030 Elevated blood-pressure reading, without diagnosis of hypertension: Secondary | ICD-10-CM | POA: Diagnosis not present

## 2022-07-07 DIAGNOSIS — M25561 Pain in right knee: Secondary | ICD-10-CM | POA: Diagnosis not present

## 2022-07-07 DIAGNOSIS — M79602 Pain in left arm: Secondary | ICD-10-CM | POA: Diagnosis not present

## 2022-07-07 DIAGNOSIS — Z87820 Personal history of traumatic brain injury: Secondary | ICD-10-CM | POA: Diagnosis not present

## 2022-07-07 DIAGNOSIS — Z681 Body mass index (BMI) 19 or less, adult: Secondary | ICD-10-CM | POA: Diagnosis not present

## 2022-07-08 ENCOUNTER — Telehealth (INDEPENDENT_AMBULATORY_CARE_PROVIDER_SITE_OTHER): Payer: Medicaid Other | Admitting: Family Medicine

## 2022-07-08 ENCOUNTER — Telehealth: Payer: Medicaid Other | Admitting: Family Medicine

## 2022-07-08 DIAGNOSIS — G894 Chronic pain syndrome: Secondary | ICD-10-CM

## 2022-07-08 DIAGNOSIS — S069X4S Unspecified intracranial injury with loss of consciousness of 6 hours to 24 hours, sequela: Secondary | ICD-10-CM

## 2022-07-09 DIAGNOSIS — Z79899 Other long term (current) drug therapy: Secondary | ICD-10-CM | POA: Diagnosis not present

## 2022-07-10 DIAGNOSIS — Z79899 Other long term (current) drug therapy: Secondary | ICD-10-CM | POA: Diagnosis not present

## 2022-07-10 DIAGNOSIS — Z681 Body mass index (BMI) 19 or less, adult: Secondary | ICD-10-CM | POA: Diagnosis not present

## 2022-07-10 DIAGNOSIS — Z87820 Personal history of traumatic brain injury: Secondary | ICD-10-CM | POA: Diagnosis not present

## 2022-07-10 DIAGNOSIS — Z0289 Encounter for other administrative examinations: Secondary | ICD-10-CM | POA: Diagnosis not present

## 2022-07-10 DIAGNOSIS — M79602 Pain in left arm: Secondary | ICD-10-CM | POA: Diagnosis not present

## 2022-07-10 DIAGNOSIS — M25561 Pain in right knee: Secondary | ICD-10-CM | POA: Diagnosis not present

## 2022-07-13 DIAGNOSIS — Z1159 Encounter for screening for other viral diseases: Secondary | ICD-10-CM | POA: Diagnosis not present

## 2022-07-13 DIAGNOSIS — R5383 Other fatigue: Secondary | ICD-10-CM | POA: Diagnosis not present

## 2022-07-13 DIAGNOSIS — E559 Vitamin D deficiency, unspecified: Secondary | ICD-10-CM | POA: Diagnosis not present

## 2022-07-13 DIAGNOSIS — Z0000 Encounter for general adult medical examination without abnormal findings: Secondary | ICD-10-CM | POA: Diagnosis not present

## 2022-07-13 DIAGNOSIS — R7303 Prediabetes: Secondary | ICD-10-CM | POA: Diagnosis not present

## 2022-07-13 DIAGNOSIS — Z681 Body mass index (BMI) 19 or less, adult: Secondary | ICD-10-CM | POA: Diagnosis not present

## 2022-07-14 NOTE — Progress Notes (Signed)
Virtual Visit via Video Note  I connected with Bruce [name] on 07/14/22 at  4:15 PM EDT by a video enabled telemedicine application and verified that I am speaking with the correct person using two identifiers.  Location: Patient: [place] Provider: [place]   I discussed the limitations of evaluation and management by telemedicine and the availability of in person appointments. The patient expressed understanding and agreed to proceed.  History of Present Illness: Patient reports that he is having issues with his TBI that is requiring referrals for ophthalmology, PT, OT,  and increased assistance in the home.    Observations/Objective:   Assessment and Plan: 1. Traumatic brain injury, with loss of consciousness of 6 hours to 24 hours, sequela (HCC) Referrals for OT, PT, and ophthalmology - Ambulatory referral to Occupational Therapy - Ambulatory referral to Physical Therapy - Ambulatory referral to Ophthalmology  2. Chronic pain syndrome Management as per consultant Toma Copier) - Ambulatory referral to Occupational Therapy - Ambulatory referral to Physical Therapy   Follow Up Instructions:    I discussed the assessment and treatment plan with the patient. The patient was provided an opportunity to ask questions and all were answered. The patient agreed with the plan and demonstrated an understanding of the instructions.   The patient was advised to call back or seek an in-person evaluation if the symptoms worsen or if the condition fails to improve as anticipated.  I provided 5 minutes of non-face-to-face time during this encounter.   Bruce Raymond, MD

## 2022-07-23 DIAGNOSIS — M545 Low back pain, unspecified: Secondary | ICD-10-CM | POA: Diagnosis not present

## 2022-07-23 DIAGNOSIS — Z0289 Encounter for other administrative examinations: Secondary | ICD-10-CM | POA: Diagnosis not present

## 2022-07-23 DIAGNOSIS — Z79899 Other long term (current) drug therapy: Secondary | ICD-10-CM | POA: Diagnosis not present

## 2022-07-23 DIAGNOSIS — M25561 Pain in right knee: Secondary | ICD-10-CM | POA: Diagnosis not present

## 2022-07-23 DIAGNOSIS — M79602 Pain in left arm: Secondary | ICD-10-CM | POA: Diagnosis not present

## 2022-07-23 DIAGNOSIS — Z681 Body mass index (BMI) 19 or less, adult: Secondary | ICD-10-CM | POA: Diagnosis not present

## 2022-07-23 DIAGNOSIS — Z7689 Persons encountering health services in other specified circumstances: Secondary | ICD-10-CM | POA: Diagnosis not present

## 2022-07-23 DIAGNOSIS — M549 Dorsalgia, unspecified: Secondary | ICD-10-CM | POA: Diagnosis not present

## 2022-07-23 DIAGNOSIS — M542 Cervicalgia: Secondary | ICD-10-CM | POA: Diagnosis not present

## 2022-07-23 DIAGNOSIS — Z87820 Personal history of traumatic brain injury: Secondary | ICD-10-CM | POA: Diagnosis not present

## 2022-07-28 ENCOUNTER — Telehealth: Payer: Self-pay | Admitting: *Deleted

## 2022-07-28 NOTE — Telephone Encounter (Signed)
I have attempted without success to contact this patient by phone to return their call . Copied from CRM [id number]. Topic: General - Other >> Jul 24, 2022 12:26 PM Bruce [name] wrote: Please call regarding  the status on my referral for occupational Therapy and Physical Therapy

## 2022-07-30 ENCOUNTER — Encounter: Payer: Self-pay | Admitting: Family Medicine

## 2022-07-30 DIAGNOSIS — Z79899 Other long term (current) drug therapy: Secondary | ICD-10-CM | POA: Diagnosis not present

## 2022-08-12 ENCOUNTER — Telehealth (INDEPENDENT_AMBULATORY_CARE_PROVIDER_SITE_OTHER): Payer: Medicaid Other | Admitting: Family Medicine

## 2022-08-12 DIAGNOSIS — Z87820 Personal history of traumatic brain injury: Secondary | ICD-10-CM
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
## 2022-08-17 ENCOUNTER — Ambulatory Visit: Payer: Medicaid Other | Attending: Family Medicine | Admitting: Physical Therapy

## 2022-08-17 ENCOUNTER — Ambulatory Visit: Payer: Medicaid Other | Admitting: Occupational Therapy

## 2022-08-17 DIAGNOSIS — R41840 Attention and concentration deficit: Secondary | ICD-10-CM | POA: Diagnosis present

## 2022-08-17 DIAGNOSIS — G894 Chronic pain syndrome: Secondary | ICD-10-CM | POA: Insufficient documentation

## 2022-08-17 DIAGNOSIS — R208 Other disturbances of skin sensation: Secondary | ICD-10-CM | POA: Insufficient documentation

## 2022-08-17 DIAGNOSIS — G8929 Other chronic pain: Secondary | ICD-10-CM

## 2022-08-17 DIAGNOSIS — R29818 Other symptoms and signs involving the nervous system: Secondary | ICD-10-CM | POA: Insufficient documentation

## 2022-08-17 DIAGNOSIS — M25641 Stiffness of right hand, not elsewhere classified: Secondary | ICD-10-CM | POA: Diagnosis present

## 2022-08-17 DIAGNOSIS — R29898 Other symptoms and signs involving the musculoskeletal system: Secondary | ICD-10-CM | POA: Diagnosis present

## 2022-08-17 DIAGNOSIS — R2689 Other abnormalities of gait and mobility: Secondary | ICD-10-CM | POA: Insufficient documentation

## 2022-08-17 DIAGNOSIS — R278 Other lack of coordination: Secondary | ICD-10-CM | POA: Diagnosis present

## 2022-08-17 DIAGNOSIS — M25511 Pain in right shoulder: Secondary | ICD-10-CM | POA: Diagnosis present

## 2022-08-17 DIAGNOSIS — R2681 Unsteadiness on feet: Secondary | ICD-10-CM | POA: Insufficient documentation

## 2022-08-17 DIAGNOSIS — S069X4S Unspecified intracranial injury with loss of consciousness of 6 hours to 24 hours, sequela: Secondary | ICD-10-CM | POA: Diagnosis not present

## 2022-08-17 DIAGNOSIS — M6281 Muscle weakness (generalized): Secondary | ICD-10-CM | POA: Diagnosis present
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
## 2022-08-19 ENCOUNTER — Encounter: Payer: Self-pay | Admitting: Family Medicine

## 2022-08-19 NOTE — Progress Notes (Signed)
Virtual Visit via Video Note  I connected with Bruce [name] on 08/19/22 at  3:40 PM EDT by a video enabled telemedicine application and verified that I am speaking with the correct person using two identifiers.  Location: Patient: Bruce [name] Provider: [place]   I discussed the limitations of evaluation and management by telemedicine and the availability of in person appointments. The patient expressed understanding and agreed to proceed.  History of Present Illness: Patient wanted to follow up on referrals. Denies acute complaints   Observations/Objective:   Assessment and Plan:  1. History of traumatic brain injury Paperwork completed for home health aide assistance   Follow Up Instructions:    I discussed the assessment and treatment plan with the patient. The patient was provided an opportunity to ask questions and all were answered. The patient agreed with the plan and demonstrated an understanding of the instructions.   The patient was advised to call back or seek an in-person evaluation if the symptoms worsen or if the condition fails to improve as anticipated.  I provided 5 minutes of non-face-to-face time during this encounter.   Bruce Raymond, MD

## 2022-09-07 ENCOUNTER — Encounter: Payer: Self-pay | Admitting: Occupational Therapy

## 2022-09-07 ENCOUNTER — Ambulatory Visit: Payer: Medicaid Other | Admitting: Physical Therapy

## 2022-09-07 ENCOUNTER — Encounter: Payer: Self-pay | Admitting: Physical Therapy

## 2022-09-07 ENCOUNTER — Ambulatory Visit: Payer: Medicaid Other | Admitting: Occupational Therapy

## 2022-09-07 VITALS — BP 137/77 | HR 53

## 2022-09-07 DIAGNOSIS — R29898 Other symptoms and signs involving the musculoskeletal system: Secondary | ICD-10-CM

## 2022-09-07 DIAGNOSIS — R278 Other lack of coordination: Secondary | ICD-10-CM

## 2022-09-07 DIAGNOSIS — R2681 Unsteadiness on feet: Secondary | ICD-10-CM

## 2022-09-07 DIAGNOSIS — M6281 Muscle weakness (generalized): Secondary | ICD-10-CM | POA: Diagnosis not present

## 2022-09-07 DIAGNOSIS — R208 Other disturbances of skin sensation: Secondary | ICD-10-CM

## 2022-09-07 DIAGNOSIS — R29818 Other symptoms and signs involving the nervous system: Secondary | ICD-10-CM
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
## 2022-09-08 ENCOUNTER — Encounter: Payer: Self-pay | Admitting: Physical Therapy

## 2022-09-14 ENCOUNTER — Ambulatory Visit: Payer: Medicaid Other | Admitting: Physical Therapy

## 2022-09-14 ENCOUNTER — Ambulatory Visit: Payer: Medicaid Other | Admitting: Occupational Therapy

## 2022-09-21 ENCOUNTER — Ambulatory Visit: Payer: Medicaid Other | Admitting: Physical Therapy

## 2022-09-21 ENCOUNTER — Encounter: Payer: Self-pay | Admitting: Occupational Therapy

## 2022-09-21 ENCOUNTER — Ambulatory Visit: Payer: Medicaid Other | Attending: Family Medicine | Admitting: Occupational Therapy

## 2022-09-21 DIAGNOSIS — R208 Other disturbances of skin sensation: Secondary | ICD-10-CM | POA: Insufficient documentation

## 2022-09-21 DIAGNOSIS — R2689 Other abnormalities of gait and mobility: Secondary | ICD-10-CM

## 2022-09-21 DIAGNOSIS — R278 Other lack of coordination: Secondary | ICD-10-CM

## 2022-09-21 DIAGNOSIS — M25641 Stiffness of right hand, not elsewhere classified: Secondary | ICD-10-CM | POA: Diagnosis present

## 2022-09-21 DIAGNOSIS — M6281 Muscle weakness (generalized): Secondary | ICD-10-CM

## 2022-09-21 DIAGNOSIS — R29898 Other symptoms and signs involving the musculoskeletal system: Secondary | ICD-10-CM | POA: Insufficient documentation

## 2022-09-21 DIAGNOSIS — R2681 Unsteadiness on feet: Secondary | ICD-10-CM

## 2022-09-21 DIAGNOSIS — R29818 Other symptoms and signs involving the nervous system: Secondary | ICD-10-CM | POA: Insufficient documentation
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
## 2022-09-23 ENCOUNTER — Ambulatory Visit (INDEPENDENT_AMBULATORY_CARE_PROVIDER_SITE_OTHER): Payer: Medicaid Other | Admitting: Family

## 2022-09-23 VITALS — BP 132/86 | HR 76 | Temp 98.6°F | Ht 66.0 in | Wt 120.2 lb

## 2022-09-23 DIAGNOSIS — L600 Ingrowing nail: Secondary | ICD-10-CM

## 2022-09-23 DIAGNOSIS — B351 Tinea unguium: Secondary | ICD-10-CM

## 2022-09-23 DIAGNOSIS — T2102XA Burn of unspecified degree of abdominal wall, initial encounter: Secondary | ICD-10-CM | POA: Diagnosis not present

## 2022-09-23 DIAGNOSIS — T300 Burn of unspecified body region, unspecified degree: Secondary | ICD-10-CM

## 2022-09-23 MED ORDER — MUPIROCIN 2 % EX OINT: 1.0000 | TOPICAL_OINTMENT | Freq: Two times a day (BID) | CUTANEOUS | 1 refills | Status: AC
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
## 2022-09-28 ENCOUNTER — Ambulatory Visit: Payer: Medicaid Other | Admitting: Occupational Therapy

## 2022-09-28 ENCOUNTER — Ambulatory Visit: Payer: Medicaid Other | Admitting: Physical Therapy

## 2022-10-07 ENCOUNTER — Ambulatory Visit: Payer: Medicaid Other | Admitting: Occupational Therapy

## 2022-10-07 ENCOUNTER — Ambulatory Visit: Payer: Medicaid Other | Admitting: Physical Therapy

## 2022-10-12 ENCOUNTER — Ambulatory Visit: Payer: Medicaid Other | Admitting: Occupational Therapy

## 2022-10-12 ENCOUNTER — Ambulatory Visit: Payer: Medicaid Other | Admitting: Physical Therapy

## 2022-10-13 ENCOUNTER — Encounter: Payer: Self-pay | Admitting: Podiatry

## 2022-10-13 ENCOUNTER — Ambulatory Visit (INDEPENDENT_AMBULATORY_CARE_PROVIDER_SITE_OTHER): Payer: Medicaid Other | Admitting: Podiatry

## 2022-10-13 DIAGNOSIS — B351 Tinea unguium: Secondary | ICD-10-CM | POA: Diagnosis not present

## 2022-10-13 DIAGNOSIS — M79676 Pain in unspecified toe(s): Secondary | ICD-10-CM | POA: Diagnosis not present

## 2022-10-13 DIAGNOSIS — L309 Dermatitis, unspecified: Secondary | ICD-10-CM

## 2022-10-13 DIAGNOSIS — L0889 Other specified local infections of the skin and subcutaneous tissue: Secondary | ICD-10-CM

## 2022-10-13 MED ORDER — KETOCONAZOLE 2 % EX CREA: 1.0000 | TOPICAL_CREAM | Freq: Two times a day (BID) | CUTANEOUS | 2 refills | Status: AC
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
## 2022-10-21 ENCOUNTER — Ambulatory Visit: Payer: Medicaid Other | Admitting: Occupational Therapy

## 2022-10-21 ENCOUNTER — Telehealth: Payer: Medicaid Other | Admitting: Family Medicine

## 2022-10-21 ENCOUNTER — Encounter: Payer: Self-pay | Admitting: Physical Therapy

## 2022-10-21 ENCOUNTER — Encounter: Payer: Self-pay | Admitting: Occupational Therapy

## 2022-10-21 ENCOUNTER — Ambulatory Visit: Payer: Medicaid Other | Admitting: Physical Therapy

## 2022-10-21 NOTE — Therapy (Signed)
[facility] [address] [place], [place], [address] Phone: [phone]   Fax:  [phone]  Patient Details  Name: Bruce [name] MRN: [id number] Date of Birth: 09-05-1980 Referring Provider:  No ref. provider found  Encounter Date: 10/21/2022  O.T. D/C Pt did not meet any goals due to not returning after the 3rd visit on 09/21/22. Pt will be discharged at this time due to poor attendance from lack of transportation (per pt report).    Sheran Lawless, OT 10/21/2022, 1:41 PM  [facility] [address] [place], [place], [address] Phone: [phone]   Fax:  [phone]

## 2022-10-21 NOTE — Therapy (Signed)
[facility] [address] [place], [place], [address] Phone: [phone]   Fax:  [phone]  Patient Details  Name: Kypton Arcia MRN: [id number] Date of Birth: 1981/01/13 Referring Provider:  No ref. provider found  Encounter Date: 10/21/2022   Patient did not meet goals due to being unable to return to therapy since 09/21/22 due to difficulty with reliable transportation. Pt has had 3+ cancels within 24 hours of scheduled appointment and will be d/c per clinic policy. Pt will need to obtain a new referral to return to PT services.   Peter Congo, PT, DPT, CSRS  10/21/2022, 2:34 PM  [facility] [address] [place], [place], [address] Phone: [phone]   Fax:  [phone]

## 2022-10-22 ENCOUNTER — Telehealth (INDEPENDENT_AMBULATORY_CARE_PROVIDER_SITE_OTHER): Payer: Medicaid Other | Admitting: Family Medicine

## 2022-10-22 DIAGNOSIS — S069X4D Unspecified intracranial injury with loss of consciousness of 6 hours to 24 hours, subsequent encounter: Secondary | ICD-10-CM

## 2022-10-22 DIAGNOSIS — S069X4S Unspecified intracranial injury with loss of consciousness of 6 hours to 24 hours, sequela: Secondary | ICD-10-CM

## 2022-10-22 NOTE — Progress Notes (Signed)
Virtual Visit via Video Note  I connected with [name] on 10/22/22 at  3:40 PM EDT by a video enabled telemedicine application and verified that I am speaking with the correct person using two identifiers.  Location: Patient: [place] Provider: [place]   I discussed the limitations of evaluation and management by telemedicine and the availability of in person appointments. The patient expressed understanding and agreed to proceed.  History of Present Illness: Patient would like continued therapy visits related to his TBI. This visit was difficult 2/2 patient's ability to communicate and technical (phone/video) issues.    Observations/Objective:   Assessment and Plan: 1. Traumatic brain injury, with loss of consciousness of 6 hours to 24 hours, sequela (HCC) There are additional referrals already done for patient to have therapies.    Follow Up Instructions:    I discussed the assessment and treatment plan with the patient. The patient was provided an opportunity to ask questions and all were answered. The patient agreed with the plan and demonstrated an understanding of the instructions.   The patient was advised to call back or seek an in-person evaluation if the symptoms worsen or if the condition fails to improve as anticipated.  I provided 7 minutes of non-face-to-face time during this encounter.   [name], MD

## 2022-10-26 ENCOUNTER — Encounter: Payer: Self-pay | Admitting: Family Medicine

## 2023-01-30 ENCOUNTER — Emergency Department (HOSPITAL_COMMUNITY)
Admission: EM | Admit: 2023-01-30 | Discharge: 2023-01-30 | Disposition: A | Discharge status: Home / Self Care | Payer: Medicaid Other | Attending: Emergency Medicine | Admitting: Emergency Medicine

## 2023-01-30 ENCOUNTER — Other Ambulatory Visit: Payer: Self-pay

## 2023-01-30 ENCOUNTER — Emergency Department (HOSPITAL_COMMUNITY): Payer: Medicaid Other

## 2023-01-30 DIAGNOSIS — S62112A Displaced fracture of triquetrum [cuneiform] bone, left wrist, initial encounter for closed fracture: Secondary | ICD-10-CM | POA: Diagnosis not present

## 2023-01-30 DIAGNOSIS — W19XXXA Unspecified fall, initial encounter: Secondary | ICD-10-CM | POA: Diagnosis not present

## 2023-01-30 DIAGNOSIS — S6992XA Unspecified injury of left wrist, hand and finger(s), initial encounter: Secondary | ICD-10-CM | POA: Diagnosis present

## 2023-01-30 MED ORDER — IBUPROFEN 400 MG PO TABS
400.0000 mg | ORAL_TABLET | Freq: Once | ORAL | Status: AC
  Administered 2023-01-30: 400 mg via ORAL
  Filled 2023-01-30: qty 1

## 2023-01-30 NOTE — ED Notes (Signed)
Pt called for room, Not in lobby. Consulting civil engineer notified,
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
## 2023-01-30 NOTE — Progress Notes (Signed)
Orthopedic Tech Progress Note Patient Details:  Bruce [name] 04/10/1980 [id number]  Ortho Devices Type of Ortho Device: Ace wrap, Cotton web roll, Volar splint Ortho Device/Splint Location: LUE Ortho Device/Splint Interventions: Ordered, Application, Adjustment   Post Interventions Patient Tolerated: Well, Fair Instructions Provided: Care of device  [name] 01/30/2023, 5:06 AM
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
## 2023-03-03 ENCOUNTER — Other Ambulatory Visit (HOSPITAL_COMMUNITY): Payer: Self-pay

## 2023-03-03 MED ORDER — OXYCODONE-ACETAMINOPHEN 7.5-325 MG PO TABS
1.0000 | ORAL_TABLET | Freq: Four times a day (QID) | ORAL | 0 refills | Status: DC | PRN
  Filled 2023-03-10: qty 120, 30d supply, fill #0

## 2023-03-08 ENCOUNTER — Other Ambulatory Visit (HOSPITAL_COMMUNITY): Payer: Self-pay

## 2023-03-09 ENCOUNTER — Other Ambulatory Visit (HOSPITAL_COMMUNITY): Payer: Self-pay

## 2023-03-10 ENCOUNTER — Other Ambulatory Visit (HOSPITAL_COMMUNITY): Payer: Self-pay

## 2023-03-30 ENCOUNTER — Other Ambulatory Visit (HOSPITAL_COMMUNITY): Payer: Self-pay

## 2023-03-30 MED ORDER — OXYCODONE-ACETAMINOPHEN 7.5-325 MG PO TABS
1.0000 | ORAL_TABLET | Freq: Four times a day (QID) | ORAL | 0 refills | Status: AC | PRN
  Filled 2023-03-30 – 2023-04-07 (×4): qty 120, 30d supply, fill #0

## 2023-04-06 ENCOUNTER — Other Ambulatory Visit (HOSPITAL_COMMUNITY): Payer: Self-pay

## 2023-04-06 ENCOUNTER — Other Ambulatory Visit: Payer: Self-pay

## 2023-04-07 ENCOUNTER — Other Ambulatory Visit (HOSPITAL_COMMUNITY): Payer: Self-pay

## 2023-04-23 ENCOUNTER — Other Ambulatory Visit: Payer: Self-pay

## 2023-04-23 ENCOUNTER — Emergency Department (HOSPITAL_COMMUNITY)

## 2023-04-23 ENCOUNTER — Emergency Department (HOSPITAL_COMMUNITY)
Admission: EM | Admit: 2023-04-23 | Discharge: 2023-04-24 | Disposition: A | Discharge status: Home / Self Care | Attending: Emergency Medicine | Admitting: Emergency Medicine

## 2023-04-23 ENCOUNTER — Encounter (HOSPITAL_COMMUNITY): Payer: Self-pay | Admitting: Emergency Medicine

## 2023-04-23 DIAGNOSIS — S31050A Open bite of lower back and pelvis without penetration into retroperitoneum, initial encounter: Secondary | ICD-10-CM | POA: Insufficient documentation

## 2023-04-23 DIAGNOSIS — R0789 Other chest pain: Secondary | ICD-10-CM | POA: Diagnosis not present

## 2023-04-23 DIAGNOSIS — Z23 Encounter for immunization: Secondary | ICD-10-CM | POA: Diagnosis not present

## 2023-04-23 DIAGNOSIS — R140 Abdominal distension (gaseous): Secondary | ICD-10-CM | POA: Insufficient documentation

## 2023-04-23 DIAGNOSIS — S6000XS Contusion of unspecified finger without damage to nail, sequela: Secondary | ICD-10-CM

## 2023-04-23 DIAGNOSIS — S60041A Contusion of right ring finger without damage to nail, initial encounter: Secondary | ICD-10-CM | POA: Insufficient documentation

## 2023-04-23 DIAGNOSIS — T148XXA Other injury of unspecified body region, initial encounter: Secondary | ICD-10-CM

## 2023-04-23 LAB — I-STAT CHEM 8, ED
BUN: 20 mg/dL (ref 6–20)
Calcium, Ion: 1.12 mmol/L — ABNORMAL LOW (ref 1.15–1.40)
Chloride: 105 mmol/L (ref 98–111)
Creatinine, Ser: 0.5 mg/dL — ABNORMAL LOW (ref 0.61–1.24)
Glucose, Bld: 120 mg/dL — ABNORMAL HIGH (ref 70–99)
HCT: 47 % (ref 39.0–52.0)
Hemoglobin: 16 g/dL (ref 13.0–17.0)
Potassium: 3.9 mmol/L (ref 3.5–5.1)
Sodium: 137 mmol/L (ref 135–145)
TCO2: 24 mmol/L (ref 22–32)

## 2023-04-23 MED ORDER — NICOTINE 21 MG/24HR TD PT24
21.0000 mg | MEDICATED_PATCH | Freq: Every day | TRANSDERMAL | Status: DC
  Filled 2023-04-23: qty 1

## 2023-04-23 MED ORDER — OXYCODONE HCL 5 MG PO TABS
2.5000 mg | ORAL_TABLET | Freq: Four times a day (QID) | ORAL | Status: DC | PRN
  Administered 2023-04-24: 2.5 mg via ORAL
  Filled 2023-04-23 (×2): qty 1

## 2023-04-23 MED ORDER — IBUPROFEN 400 MG PO TABS
600.0000 mg | ORAL_TABLET | Freq: Three times a day (TID) | ORAL | Status: DC | PRN
  Administered 2023-04-24: 600 mg via ORAL
  Filled 2023-04-23: qty 1

## 2023-04-23 MED ORDER — OXYCODONE-ACETAMINOPHEN 5-325 MG PO TABS
1.0000 | ORAL_TABLET | Freq: Once | ORAL | Status: AC
  Administered 2023-04-23: 1 via ORAL
  Filled 2023-04-23: qty 1

## 2023-04-23 MED ORDER — TETANUS-DIPHTH-ACELL PERTUSSIS 5-2.5-18.5 LF-MCG/0.5 IM SUSY
0.5000 mL | PREFILLED_SYRINGE | Freq: Once | INTRAMUSCULAR | Status: AC
  Administered 2023-04-23: 0.5 mL via INTRAMUSCULAR
  Filled 2023-04-23: qty 0.5

## 2023-04-23 MED ORDER — IBUPROFEN 800 MG PO TABS
800.0000 mg | ORAL_TABLET | Freq: Once | ORAL | Status: AC
  Administered 2023-04-23: 800 mg via ORAL
  Filled 2023-04-23: qty 1

## 2023-04-23 MED ORDER — ALUM & MAG HYDROXIDE-SIMETH 200-200-20 MG/5ML PO SUSP: 30.0000 mL | Freq: Four times a day (QID) | ORAL | Status: DC | PRN

## 2023-04-23 MED ORDER — ONDANSETRON HCL 4 MG PO TABS
4.0000 mg | ORAL_TABLET | Freq: Three times a day (TID) | ORAL | Status: DC | PRN
  Administered 2023-04-24: 4 mg via ORAL
  Filled 2023-04-23: qty 1

## 2023-04-23 MED ORDER — OXYCODONE-ACETAMINOPHEN 5-325 MG PO TABS
1.0000 | ORAL_TABLET | Freq: Four times a day (QID) | ORAL | Status: DC | PRN
  Administered 2023-04-23 – 2023-04-24 (×3): 1 via ORAL
  Filled 2023-04-23 (×3): qty 1

## 2023-04-23 MED ORDER — IOHEXOL 350 MG/ML SOLN
75.0000 mL | Freq: Once | INTRAVENOUS | Status: AC | PRN
  Administered 2023-04-23: 75 mL via INTRAVENOUS

## 2023-04-23 MED ORDER — OXYCODONE-ACETAMINOPHEN 7.5-325 MG PO TABS: 1.0000 | ORAL_TABLET | Freq: Four times a day (QID) | ORAL | Status: DC | PRN

## 2023-04-23 MED ORDER — AMOXICILLIN-POT CLAVULANATE 875-125 MG PO TABS: 1.0000 | ORAL_TABLET | Freq: Two times a day (BID) | ORAL | 0 refills | Status: AC

## 2023-04-23 MED ORDER — AMOXICILLIN-POT CLAVULANATE 875-125 MG PO TABS
1.0000 | ORAL_TABLET | Freq: Once | ORAL | Status: AC
  Administered 2023-04-23: 1 via ORAL
  Filled 2023-04-23: qty 1
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
## 2023-04-23 NOTE — ED Notes (Signed)
 Patient transported to X-ray
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
## 2023-04-23 NOTE — ED Notes (Signed)
 Just assumed care of patient. Patient is laying in bed in no noted distress at the present time will continue to monitor for any changes.
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
## 2023-04-24 NOTE — ED Notes (Signed)
 This RN reviewed discharge instructions with patient.He verbalized understanding and denied any further questions. PT well appearing upon discharge and reports pain. Pt wheeled out to lobby and given taxi cab voucher for blue bird to home.
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
## 2023-04-30 ENCOUNTER — Telehealth: Payer: Self-pay
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
## 2023-05-04 ENCOUNTER — Inpatient Hospital Stay: Admitting: Family Medicine

## 2023-05-12 ENCOUNTER — Ambulatory Visit: Admitting: Family Medicine

## 2023-05-12 ENCOUNTER — Encounter: Payer: Self-pay | Admitting: Family Medicine

## 2023-05-12 DIAGNOSIS — Z87820 Personal history of traumatic brain injury: Secondary | ICD-10-CM

## 2023-05-12 DIAGNOSIS — R4189 Other symptoms and signs involving cognitive functions and awareness: Secondary | ICD-10-CM

## 2023-05-12 DIAGNOSIS — S069XAS Unspecified intracranial injury with loss of consciousness status unknown, sequela: Secondary | ICD-10-CM
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
## 2023-05-14 ENCOUNTER — Encounter: Payer: Self-pay | Admitting: Family Medicine

## 2023-07-08 ENCOUNTER — Telehealth: Payer: Self-pay | Admitting: *Deleted
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
## 2023-08-12 ENCOUNTER — Ambulatory Visit (INDEPENDENT_AMBULATORY_CARE_PROVIDER_SITE_OTHER): Admitting: Family Medicine

## 2023-08-12 ENCOUNTER — Encounter: Payer: Self-pay | Admitting: Family Medicine

## 2023-08-12 VITALS — BP 121/79 | HR 69 | Wt 121.6 lb

## 2023-08-12 DIAGNOSIS — G8111 Spastic hemiplegia affecting right dominant side: Secondary | ICD-10-CM

## 2023-08-12 DIAGNOSIS — Z87820 Personal history of traumatic brain injury: Secondary | ICD-10-CM | POA: Diagnosis not present
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
## 2023-08-13 ENCOUNTER — Encounter: Payer: Self-pay | Admitting: Family Medicine

## 2024-02-02 ENCOUNTER — Ambulatory Visit: Admitting: Family Medicine

## 2024-03-27 ENCOUNTER — Ambulatory Visit: Admitting: Family Medicine

## 5787-08-17 DEATH — deceased
